# Patient Record
Sex: Male | Born: 1950
Health system: Southern US, Community
[De-identification: ages and names within clinical notes are randomized; demographics above are authoritative.]

## PROBLEM LIST (undated history)

## (undated) DIAGNOSIS — T8859XA Other complications of anesthesia, initial encounter: Secondary | ICD-10-CM

## (undated) DIAGNOSIS — E781 Pure hyperglyceridemia: Secondary | ICD-10-CM

## (undated) DIAGNOSIS — M1990 Unspecified osteoarthritis, unspecified site: Secondary | ICD-10-CM

## (undated) DIAGNOSIS — K449 Diaphragmatic hernia without obstruction or gangrene: Secondary | ICD-10-CM

## (undated) DIAGNOSIS — T4145XA Adverse effect of unspecified anesthetic, initial encounter: Secondary | ICD-10-CM

## (undated) DIAGNOSIS — R7301 Impaired fasting glucose: Secondary | ICD-10-CM

## (undated) DIAGNOSIS — I10 Essential (primary) hypertension: Secondary | ICD-10-CM

## (undated) DIAGNOSIS — E039 Hypothyroidism, unspecified: Secondary | ICD-10-CM

## (undated) DIAGNOSIS — E079 Disorder of thyroid, unspecified: Secondary | ICD-10-CM

## (undated) DIAGNOSIS — E7800 Pure hypercholesterolemia, unspecified: Secondary | ICD-10-CM

## (undated) HISTORY — PX: JOINT REPLACEMENT: SHX530

## (undated) HISTORY — DX: Disorder of thyroid, unspecified: E07.9

## (undated) HISTORY — PX: CHOLECYSTECTOMY: SHX55

## (undated) HISTORY — PX: EYE SURGERY: SHX253

## (undated) HISTORY — PX: OTHER SURGICAL HISTORY: SHX169

## (undated) HISTORY — DX: Impaired fasting glucose: R73.01

## (undated) HISTORY — DX: Pure hyperglyceridemia: E78.1

---

## 2004-10-09 ENCOUNTER — Ambulatory Visit: Payer: Self-pay | Admitting: Orthopedic Surgery

## 2004-10-10 ENCOUNTER — Ambulatory Visit (HOSPITAL_COMMUNITY): Admission: RE | Admit: 2004-10-10 | Discharge: 2004-10-10 | Payer: Self-pay | Admitting: Orthopedic Surgery

## 2004-10-16 ENCOUNTER — Ambulatory Visit: Payer: Self-pay | Admitting: Orthopedic Surgery

## 2004-11-04 ENCOUNTER — Ambulatory Visit (HOSPITAL_COMMUNITY): Admission: RE | Admit: 2004-11-04 | Discharge: 2004-11-04 | Payer: Self-pay | Admitting: Orthopedic Surgery

## 2004-11-04 ENCOUNTER — Ambulatory Visit: Payer: Self-pay | Admitting: Orthopedic Surgery

## 2004-11-06 ENCOUNTER — Ambulatory Visit: Payer: Self-pay | Admitting: Orthopedic Surgery

## 2004-11-07 ENCOUNTER — Encounter (HOSPITAL_COMMUNITY): Admission: RE | Admit: 2004-11-07 | Discharge: 2004-11-15 | Payer: Self-pay | Admitting: Orthopedic Surgery

## 2004-11-17 ENCOUNTER — Encounter (HOSPITAL_COMMUNITY): Admission: RE | Admit: 2004-11-17 | Discharge: 2004-12-17 | Payer: Self-pay | Admitting: Orthopedic Surgery

## 2004-11-27 ENCOUNTER — Ambulatory Visit: Payer: Self-pay | Admitting: Orthopedic Surgery

## 2004-12-19 ENCOUNTER — Encounter (HOSPITAL_COMMUNITY): Admission: RE | Admit: 2004-12-19 | Discharge: 2005-01-18 | Payer: Self-pay | Admitting: Orthopedic Surgery

## 2004-12-24 ENCOUNTER — Ambulatory Visit: Payer: Self-pay | Admitting: Orthopedic Surgery

## 2005-01-07 ENCOUNTER — Ambulatory Visit: Payer: Self-pay | Admitting: Orthopedic Surgery

## 2008-06-12 ENCOUNTER — Encounter (INDEPENDENT_AMBULATORY_CARE_PROVIDER_SITE_OTHER): Payer: Self-pay | Admitting: General Surgery

## 2008-06-12 ENCOUNTER — Ambulatory Visit (HOSPITAL_COMMUNITY): Admission: RE | Admit: 2008-06-12 | Discharge: 2008-06-12 | Payer: Self-pay | Admitting: General Surgery

## 2009-07-18 ENCOUNTER — Emergency Department (HOSPITAL_COMMUNITY): Admission: EM | Admit: 2009-07-18 | Discharge: 2009-07-18 | Payer: Self-pay | Admitting: Emergency Medicine

## 2010-05-05 LAB — COMPREHENSIVE METABOLIC PANEL
ALT: 30 U/L (ref 0–53)
AST: 49 U/L — ABNORMAL HIGH (ref 0–37)
Albumin: 3.7 g/dL (ref 3.5–5.2)
Alkaline Phosphatase: 54 U/L (ref 39–117)
BUN: 19 mg/dL (ref 6–23)
Creatinine, Ser: 1.48 mg/dL (ref 0.4–1.5)
GFR calc non Af Amer: 49 mL/min — ABNORMAL LOW (ref >60)
Glucose, Bld: 98 mg/dL (ref 70–99)
Potassium: 3.9 mEq/L (ref 3.5–5.1)
Total Bilirubin: 0.9 mg/dL (ref 0.3–1.2)
Total Protein: 7.9 g/dL (ref 6.0–8.3)

## 2010-05-05 LAB — DIFFERENTIAL
Basophils Absolute: 0 10*3/uL (ref 0.0–0.1)
Basophils Relative: 0 % (ref 0–1)
Eosinophils Relative: 6 % — ABNORMAL HIGH (ref 0–5)
Lymphs Abs: 2.3 10*3/uL (ref 0.7–4.0)
Neutro Abs: 5.3 10*3/uL (ref 1.7–7.7)
Neutrophils Relative %: 59 % (ref 43–77)

## 2010-05-05 LAB — URINALYSIS, ROUTINE W REFLEX MICROSCOPIC
Bilirubin Urine: NEGATIVE
Ketones, ur: NEGATIVE mg/dL
Protein, ur: NEGATIVE mg/dL
pH: 5.5 (ref 5.0–8.0)

## 2010-05-05 LAB — CBC
Hemoglobin: 13.7 g/dL (ref 13.0–17.0)
Platelets: 253 10*3/uL (ref 150–400)
RDW: 13.1 % (ref 11.5–15.5)

## 2010-05-05 LAB — LIPASE, BLOOD: Lipase: 30 U/L (ref 11–59)

## 2010-05-05 LAB — URINE MICROSCOPIC-ADD ON

## 2010-07-01 NOTE — H&P (Signed)
NAME:  Harold Nicholson, Harold Nicholson                ACCOUNT NO.:  [id number]   MEDICAL RECORD NO.:  [id number]          PATIENT TYPE:  AMB   LOCATION:  DAY                           FACILITY:  [facility]   PHYSICIAN:  Dalia Heading, M.D.  DATE OF BIRTH:  03-Jan-1951   DATE OF ADMISSION:  DATE OF DISCHARGE:  LH                              HISTORY & PHYSICAL   CHIEF COMPLAINT:  Hematochezia.   HISTORY OF PRESENT ILLNESS:  The patient is a 60 year old white male who  is referred for endoscopic evaluation.  He needs colonoscopy for  hematochezia.  He was noted recently while he was in rehab for alcohol  abuse.  No abdominal pain, weight loss, nausea, vomiting, diarrhea,  constipation, or melena had been noted.  He has never had a colonoscopy.  There is no family history of colon carcinoma.   PAST MEDICAL HISTORY:  Includes hypothyroidism, hypertension, and  extrinsic allergies.   PAST SURGICAL HISTORY:  Multiple knee surgeries.   CURRENT MEDICATIONS:  1. Hydrochlorothiazide 25 mg p.o. daily.  2. TriCor 145 mg p.o. daily.  3. Synthroid 150 mcg p.o. daily.  4. Bisoprolol/hydrochlorothiazide 10/6.25 mg p.o. daily.  5. Claritin 10 mg p.o. daily.  6. Potassium supplements 1 tablet p.o. daily   ALLERGIES:  No known drug allergies.   REVIEW OF SYSTEMS:  Noncontributory.   PHYSICAL EXAMINATION:  GENERAL:  The patient is a well developed and  well nourished white male, in no acute distress.  LUNGS:  Clear to auscultation with equal breath sounds bilaterally.  HEART:  Reveals regular rate and rhythm without S3, S4, or murmurs.  ABDOMEN:  Soft, nontender, and nondistended.  No hepatosplenomegaly or  masses are noted.  RECTAL:  Examination was deferred to the procedure.   IMPRESSION:  Hematochezia.   PLAN:  The patient is scheduled for a colonoscopy on June 12, 2008.  The risks and benefits of the procedure including bleeding and  perforation were fully explained to the patient, gave informed  consent.      Dalia Heading, M.D.  Electronically Signed     MAJ/MEDQ  D:  06/05/2008  T:  06/06/2008  Job:  [id number]   cc:   Corrie Mckusick, M.D.  Fax: [phone]   Short Stay At [facility]

## 2010-07-04 NOTE — Op Note (Signed)
NAMEMICHAIAH, HOLSOPPLE                ACCOUNT NO.:  [id number]   MEDICAL RECORD NO.:  [id number]          PATIENT TYPE:  AMB   LOCATION:  DAY                           FACILITY:  [facility]   PHYSICIAN:  Vickki Hearing, M.D.DATE OF BIRTH:  1950-03-24   DATE OF PROCEDURE:  11/04/2004  DATE OF DISCHARGE:                                 OPERATIVE REPORT   HISTORY:  A 60 year old male with left knee pain, positive meniscal signs,  positive MRI for medial meniscal tear and free edge tear lateral meniscus  with osteoarthritis of the medial compartment.   PREOPERATIVE DIAGNOSIS:  Torn medial meniscus, osteoarthritis, left knee.   POSTOPERATIVE DIAGNOSIS:  Torn medial meniscus, left knee; torn lateral  meniscus, left knee; osteoarthritis, left knee.   OPERATIVE FINDINGS:  The patient had grade 2 arthritis of the medial femoral  condyle. He had some grade 1 changes in the trochlear region. He had a torn  posterior horn medial meniscus, free edge tear lateral meniscus,  hypertrophic synovitis patellofemoral and medial compartment.   SURGEON:  Dr. Romeo Apple.   ANESTHETIC:  General by LMA.   COMPLICATIONS:  There were no complications. The patient went to recovery  room in stable condition after surgery.   Counts were correct.   The patient was identified as Harold Nicholson. Left knee was marked for surgery  by the patient, countersigned by the surgeon. History and physical were  updated. Antibiotics were given. He was taken to the operating for general  anesthesia. After successful anesthesia, his left lower extremity was  prepped and draped using sterile technique. At that time, a time-out was  taken and completed to confirm the procedure   Standard arthroscopy was done using a lateral viewing portal and medial  working portal. The diagnostic arthroscopy was performed. The findings are  noted as above.   We started on the medial side first. We used a duckbill forceps to resect  the tear,  balanced the meniscus with a straight motorized shaver, performed  a chondroplasty with the same. We debrided the medial patellofemoral  compartment and medial compartments from hypertrophic synovitis.   We then placed the shaver in the lateral compartment and debrided and  resected the free edge tear of the lateral meniscus.   We irrigated the wound and then closed with Steri-Strips, injected 30 cc of  plain half percent Marcaine that was well tolerated.   We then took the patient to the recovery room after his dressings and  CryoCuff were applied. He was in good condition.   POSTOPERATIVE PLAN:  Follow-up in two days. Start therapy in 3 days. He can  take Lorcet Plus 1 q.4h. p.r.n. for pain #60 with two refills. He should  weight bear as tolerated. Change his dressings on Wednesday to two Band-  Aids.      Vickki Hearing, M.D.  Electronically Signed     SEH/MEDQ  D:  11/04/2004  T:  11/04/2004  Job:  [id number]

## 2010-07-04 NOTE — H&P (Signed)
Harold Nicholson, CORONA NO.:  [id number]   MEDICAL RECORD NO.:  [id number]          PATIENT TYPE:  AMB   LOCATION:  DAY                           FACILITY:  [facility]   PHYSICIAN:  Vickki Hearing, M.D.DATE OF BIRTH:  1950-08-07   DATE OF ADMISSION:  DATE OF DISCHARGE:  LH                                HISTORY & PHYSICAL   CHIEF COMPLAINT:  Left knee pain.   HISTORY:  [age]-year-old male with two to three months of pain in his  left knee, denies any injury, he has noted some swelling, he does have  mechanical symptoms on an occasional basis, he describes the pain as  moderate in severity, intermittent, dull, present for two to three months  modified by walking and associated with some swelling.   REVIEW OF SYSTEMS:  Positive findings are thyroid disease, seasonal  allergies, as noted on musculoskeletal.  Other seven systems reviewed and  normal.   ALLERGIES:  None known.   MEDICAL PROBLEMS:  Hypertension, thyroid disease, hypercholesterolemia.  Surgery on his right knee in the past.   MEDICATIONS:  His pharmacy is [facility] .  He takes Levoxyl 175 mcg,  hydrochlorothiazide 25 mg, Tricor 145 mg, Lotrel 5/20 mg.   FAMILY HISTORY:  Arthritis.   FAMILY PHYSICIAN:  Dr. Assunta Found.   SOCIAL HISTORY:  He is married.  He works in [employer].  Does not smoke  but uses snuff.  Does report use of beer and caffeine and soft drinks.  Educational level not reported.   PHYSICAL EXAMINATION:  VITAL SIGNS:  Weight 265, pulse 78, respiratory rate  16.  GENERAL APPEARANCE:  Normal development, grooming, hygiene, nutrition.  Body  habitus large.  PSYCHIATRIC:  He is alert and oriented x3.  NEUROLOGIC EXAM:  Normal.  CARDIOVASCULAR:  Pulses are normal without any swelling, venous stasis  disease, temperature changes or edema.  SKIN:  Normal.  NECK:  Nodes are negative.  LEFT KNEE:  Examination of the left knee reveals normal range of motion and  strength,  and there is no ligamentous instability.  He has tenderness over  his medial joint line, mild swelling, his knee is in varus, he has positive  meniscal signs.  His opposite extremity is normal with history of previous  surgery with good results, upper extremity has normal range of motion,  strength, stability and alignment.   RADIOGRAPHS:  Mild osteoarthritis.   He did have MRI of the left knee on October 10, 2004.  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   DIAGNOSES:  1.  Medial meniscal tear.  2.  Osteoarthritis.   PLAN:  Arthroscopy left knee medial meniscectomy.      Vickki Hearing, M.D.  Electronically Signed     SEH/MEDQ  D:  11/03/2004  T:  11/03/2004  Job:  [id number]

## 2010-09-18 NOTE — H&P (Signed)
Harold Nicholson is an 60 y.o. male.   Chief Complaint: Epigastric pain, h/o colon polyps HPI: Has intermittent epigastric pain, though it has been less frequent recently.  Is on a PPI.  Has been treated in the past for H. Pylori infection.  Had TCS in past with several benign polyps removed.  Denies melena, hematochezia, dysphagia.  No past medical history on file.  No past surgical history on file.  No family history on file. Social History:  does not have a smoking history on file. He does not have any smokeless tobacco history on file. His alcohol and drug histories not on file.  Allergies: Allergies not on file  No current facility-administered medications on file as of .   No current outpatient prescriptions on file as of .    No results found for this or any previous visit (from the past 48 hour(s)). No results found.  Review of Systems  Constitutional: Negative.   HENT: Negative.   Eyes: Negative.   Respiratory: Negative.   Cardiovascular: Negative.   Gastrointestinal: Negative.   Genitourinary: Negative.   Musculoskeletal: Negative.   Skin: Negative.   Neurological: Negative.   Endo/Heme/Allergies: Negative.   Psychiatric/Behavioral: Negative.     There were no vitals taken for this visit. Physical Exam  Constitutional: He is oriented to person, place, and time. He appears well-developed and well-nourished.  HENT:  Head: Normocephalic and atraumatic.  Neck: Normal range of motion. Neck supple.  Cardiovascular: Normal rate, regular rhythm and normal heart sounds.   Respiratory: Effort normal and breath sounds normal.  GI: Soft. Bowel sounds are normal. He exhibits no distension. There is no tenderness. There is no rebound.  Neurological: He is alert and oriented to person, place, and time.  Skin: Skin is warm and dry.  Psychiatric: He has a normal mood and affect. His behavior is normal. Judgment and thought content normal.     Assessment/Plan Epigastric pain,  h/o colon polyps Plan:  Scheduled for EGD, TCS on 10/07/10.  Rainbow Salman A 09/18/2010, 2:49 PM

## 2010-10-07 ENCOUNTER — Encounter (HOSPITAL_COMMUNITY)
Admission: RE | Disposition: A | Discharge status: Home / Self Care | Payer: Self-pay | Source: Ambulatory Visit | Attending: General Surgery

## 2010-10-07 ENCOUNTER — Ambulatory Visit (HOSPITAL_COMMUNITY)
Admission: RE | Admit: 2010-10-07 | Discharge: 2010-10-07 | Disposition: A | Discharge status: Home / Self Care | Payer: 59 | Source: Ambulatory Visit | Attending: General Surgery | Admitting: General Surgery

## 2010-10-07 ENCOUNTER — Encounter (HOSPITAL_COMMUNITY): Payer: Self-pay | Admitting: *Deleted

## 2010-10-07 DIAGNOSIS — R109 Unspecified abdominal pain: Secondary | ICD-10-CM | POA: Insufficient documentation

## 2010-10-07 DIAGNOSIS — Z860100 Personal history of colon polyps, unspecified: Secondary | ICD-10-CM | POA: Insufficient documentation

## 2010-10-07 DIAGNOSIS — K2970 Gastritis, unspecified, without bleeding: Secondary | ICD-10-CM | POA: Insufficient documentation

## 2010-10-07 HISTORY — DX: Pure hypercholesterolemia, unspecified: E78.00

## 2010-10-07 HISTORY — PX: ESOPHAGOGASTRODUODENOSCOPY: SHX5428

## 2010-10-07 HISTORY — DX: Essential (primary) hypertension: I10

## 2010-10-07 HISTORY — PX: COLONOSCOPY: SHX5424

## 2010-10-07 HISTORY — DX: Diaphragmatic hernia without obstruction or gangrene: K44.9

## 2010-10-07 SURGERY — COLONOSCOPY
Anesthesia: Moderate Sedation

## 2010-10-07 MED ORDER — MIDAZOLAM HCL 5 MG/5ML IJ SOLN
INTRAMUSCULAR | Status: DC | PRN
  Administered 2010-10-07: 4 mg via INTRAVENOUS

## 2010-10-07 MED ORDER — MIDAZOLAM HCL 5 MG/5ML IJ SOLN
INTRAMUSCULAR | Status: AC
  Filled 2010-10-07: qty 10

## 2010-10-07 MED ORDER — MEPERIDINE HCL 25 MG/ML IJ SOLN
INTRAMUSCULAR | Status: DC | PRN
  Administered 2010-10-07: 50 mg via INTRAVENOUS

## 2010-10-07 MED ORDER — MEPERIDINE HCL 100 MG/ML IJ SOLN
INTRAMUSCULAR | Status: AC
  Filled 2010-10-07: qty 2

## 2010-10-07 MED ORDER — SODIUM CHLORIDE 0.45 % IV SOLN
Freq: Once | INTRAVENOUS | Status: AC
  Administered 2010-10-07: 09:00:00 via INTRAVENOUS

## 2010-10-07 MED ORDER — STERILE WATER FOR IRRIGATION IR SOLN
Status: DC | PRN
  Administered 2010-10-07: 09:00:00

## 2010-10-08 LAB — CLOTEST (H. PYLORI), BIOPSY: Helicobacter screen: NEGATIVE

## 2010-10-10 ENCOUNTER — Encounter (HOSPITAL_COMMUNITY): Payer: Self-pay | Admitting: General Surgery

## 2010-11-17 ENCOUNTER — Other Ambulatory Visit (HOSPITAL_COMMUNITY): Payer: Self-pay | Admitting: Nephrology

## 2010-11-17 DIAGNOSIS — N289 Disorder of kidney and ureter, unspecified: Secondary | ICD-10-CM

## 2010-12-01 ENCOUNTER — Ambulatory Visit (HOSPITAL_COMMUNITY)
Admission: RE | Admit: 2010-12-01 | Discharge: 2010-12-01 | Disposition: A | Discharge status: Home / Self Care | Payer: 59 | Source: Ambulatory Visit | Attending: Nephrology | Admitting: Nephrology

## 2010-12-01 DIAGNOSIS — N289 Disorder of kidney and ureter, unspecified: Secondary | ICD-10-CM | POA: Insufficient documentation

## 2011-09-10 ENCOUNTER — Other Ambulatory Visit: Payer: Self-pay

## 2011-09-10 DIAGNOSIS — R0989 Other specified symptoms and signs involving the circulatory and respiratory systems: Secondary | ICD-10-CM

## 2011-10-12 ENCOUNTER — Encounter: Payer: Self-pay | Admitting: Vascular Surgery

## 2011-10-13 ENCOUNTER — Encounter: Payer: Self-pay | Admitting: Vascular Surgery

## 2011-10-14 ENCOUNTER — Encounter (INDEPENDENT_AMBULATORY_CARE_PROVIDER_SITE_OTHER): Payer: 59 | Admitting: Vascular Surgery

## 2011-10-14 ENCOUNTER — Ambulatory Visit (INDEPENDENT_AMBULATORY_CARE_PROVIDER_SITE_OTHER): Payer: 59 | Admitting: Vascular Surgery

## 2011-10-14 ENCOUNTER — Encounter: Payer: Self-pay | Admitting: Vascular Surgery

## 2011-10-14 VITALS — BP 144/64 | HR 50 | Resp 16 | Ht 73.0 in | Wt 273.0 lb

## 2011-10-14 DIAGNOSIS — M79606 Pain in leg, unspecified: Secondary | ICD-10-CM

## 2011-10-14 DIAGNOSIS — R0989 Other specified symptoms and signs involving the circulatory and respiratory systems: Secondary | ICD-10-CM

## 2011-10-14 DIAGNOSIS — I70219 Atherosclerosis of native arteries of extremities with intermittent claudication, unspecified extremity: Secondary | ICD-10-CM

## 2011-10-14 DIAGNOSIS — M79609 Pain in unspecified limb: Secondary | ICD-10-CM

## 2011-10-14 NOTE — Progress Notes (Signed)
[facility]  Patient name: Harold Nicholson MRN: [id number] DOB: 03/16/1950 Sex: male  REASON FOR CONSULT: bilateral leg pain  HPI: Harold Nicholson is a 61 y.o. male states that for years he has had pain in both lower extremities. He states that his legs "give out on him" when he is ambulating. He experiences some pain involves his entire legs but also occurs oftentimes simply with standing. He experiences the pain when walking and a variable distance and the pain is not always resolve when he stops walking. I do not get any clear-cut history of calf claudication or thigh claudication. He denies any history of rest pain. He does have a history of lumbar disc disease.  Past Medical History  Diagnosis Date  . Hypertension   . Hypercholesteremia   . Hiatal hernia   . Pure hyperglyceridemia   . Thyroid disease     Hypothyroid   . Impaired fasting glucose     Family History  Problem Relation Age of Onset  . Diabetes Mother   . Hyperlipidemia Mother   . Hypertension Mother   . Hyperlipidemia Father   . Hypertension Father   . Diabetes Sister   . Hearing loss Sister   . Hyperlipidemia Sister   . Hypertension Sister   . Diabetes Brother   . Hyperlipidemia Brother   . Hypertension Brother     SOCIAL HISTORY: History  Substance Use Topics  . Smoking status: Never Smoker   . Smokeless tobacco: Current User    Types: Snuff  . Alcohol Use: No     quit drinking 3 years ago    No Known Allergies  Current Outpatient Prescriptions  Medication Sig Dispense Refill  . bisoprolol-hydrochlorothiazide (ZIAC) 10-6.25 MG per tablet Take 1 tablet by mouth daily.        . cholecalciferol (VITAMIN D) 1000 UNITS tablet Take 1,000 Units by mouth daily.      . fenofibrate 160 MG tablet Take 160 mg by mouth daily.        . hydrochlorothiazide 25 MG tablet Take 25 mg by mouth daily.        Marland Kitchen levothyroxine (SYNTHROID, LEVOTHROID) 150 MCG tablet Take 150 mcg by mouth  daily.        Marland Kitchen loratadine (CLARITIN) 10 MG tablet Take 10 mg by mouth daily.        Marland Kitchen omeprazole (PRILOSEC) 40 MG capsule Take 40 mg by mouth daily.        Marland Kitchen POTASSIUM AMINOBENZOATE PO Take by mouth daily.        REVIEW OF SYSTEMS: Arly.Keller ] denotes positive finding; [  ] denotes negative finding  CARDIOVASCULAR:  [ ]  chest pain   [ ]  chest pressure   [ ]  palpitations   [ ]  orthopnea   [ ]  dyspnea on exertion   [ ]  claudication   [ ]  rest pain   [ ]  DVT   [ ]  phlebitis PULMONARY:   [ ]  productive cough   [ ]  asthma   [ ]  wheezing NEUROLOGIC:   [ ]  weakness  [ ]  paresthesias  [ ]  aphasia  [ ]  amaurosis  [ ]  dizziness HEMATOLOGIC:   [ ]  bleeding problems   [ ]  clotting disorders MUSCULOSKELETAL:  [ ]  joint pain   [ ]  joint swelling [ ]  leg swelling GASTROINTESTINAL: [ ]   blood in stool  [ ]   hematemesis GENITOURINARY:  [ ]   dysuria  [ ]   hematuria  PSYCHIATRIC:  [ ]  history of major depression INTEGUMENTARY:  [ ]  rashes  [ ]  ulcers CONSTITUTIONAL:  [ ]  fever   [ ]  chills  PHYSICAL EXAM: Filed Vitals:   10/14/11 0919  BP: 144/64  Pulse: 50  Resp: 16  Height: 6\' 1"  (1.854 m)  Weight: 273 lb (123.832 kg)  SpO2: 100%   Body mass index is 36.02 kg/(m^2). GENERAL: The patient is a well-nourished male, in no acute distress. The vital signs are documented above. CARDIOVASCULAR: There is a regular rate and rhythm without significant murmur appreciated. I do not detect carotid bruits. He has palpable femoral, popliteal, and posterior tibial pulses bilaterally. It is difficult to palpate his dorsalis pedis pulses although he has triphasic Doppler signals in the dorsalis pedis position. PULMONARY: There is good air exchange bilaterally without wheezing or rales. ABDOMEN: Soft and non-tender with normal pitched bowel sounds.  MUSCULOSKELETAL: There are no major deformities or cyanosis. NEUROLOGIC: No focal weakness or paresthesias are detected. SKIN: There are no ulcers or rashes noted. PSYCHIATRIC:  The patient has a normal affect.  DATA:  I have independently interpreted his arterial Doppler study. This shows triphasic waveforms in the dorsalis pedis and posterior tibial positions bilaterally. He has normal ABIs bilaterally. He has normal toe pressures bilaterally.  I have reviewed his records from Dr. Lamar Blinks office. His blood pressure and cholesterol are well controlled. His last cholesterol level is 157 on 02/16/2011. LDL cholesterol was 99 on 16/11/9602.  MEDICAL ISSUES:  Leg pain I do not think that this patient's leg pain is related to peripheral vascular disease. I think it is more likely related to his lumbar disc disease. He experiences pain simply with standing. He does experience pain with ambulation but the symptoms do not quickly respond when he stops walking. In addition he has palpable posterior tibial pulses and triphasic Doppler signals in the dorsalis pedis and posterior tibial positions bilaterally with normal ABIs and normal toe pressures. There is a very small chance that he has some mild iliac disease which could explain some of his symptoms when he walks although I think this is unlikely. If his symptoms progressed and he is undergone workup for his lumbar disc disease we could consider arteriography in the future, however, at this point I think the yield would be very low.   Justis Closser S [facility] [place] [facility]per: [phone]

## 2011-10-14 NOTE — Assessment & Plan Note (Signed)
I do not think that this patient's leg pain is related to peripheral vascular disease. I think it is more likely related to his lumbar disc disease. He experiences pain simply with standing. He does experience pain with ambulation but the symptoms do not quickly respond when he stops walking. In addition he has palpable posterior tibial pulses and triphasic Doppler signals in the dorsalis pedis and posterior tibial positions bilaterally with normal ABIs and normal toe pressures. There is a very small chance that he has some mild iliac disease which could explain some of his symptoms when he walks although I think this is unlikely. If his symptoms progressed and he is undergone workup for his lumbar disc disease we could consider arteriography in the future, however, at this point I think the yield would be very low.

## 2013-04-18 ENCOUNTER — Other Ambulatory Visit (HOSPITAL_COMMUNITY): Payer: Self-pay | Admitting: Family Medicine

## 2013-04-18 ENCOUNTER — Ambulatory Visit (HOSPITAL_COMMUNITY)
Admission: RE | Admit: 2013-04-18 | Discharge: 2013-04-18 | Disposition: A | Discharge status: Home / Self Care | Payer: Medicare HMO | Source: Ambulatory Visit | Attending: Family Medicine | Admitting: Family Medicine

## 2013-04-18 DIAGNOSIS — K7689 Other specified diseases of liver: Secondary | ICD-10-CM | POA: Insufficient documentation

## 2013-04-18 DIAGNOSIS — R109 Unspecified abdominal pain: Secondary | ICD-10-CM

## 2013-04-18 DIAGNOSIS — M47817 Spondylosis without myelopathy or radiculopathy, lumbosacral region: Secondary | ICD-10-CM | POA: Insufficient documentation

## 2013-04-18 DIAGNOSIS — R1031 Right lower quadrant pain: Secondary | ICD-10-CM

## 2013-04-18 MED ORDER — IOHEXOL 300 MG/ML  SOLN
100.0000 mL | Freq: Once | INTRAMUSCULAR | Status: AC | PRN
  Administered 2013-04-18: 100 mL via INTRAVENOUS

## 2013-04-19 ENCOUNTER — Ambulatory Visit (HOSPITAL_COMMUNITY)
Admission: RE | Admit: 2013-04-19 | Discharge: 2013-04-19 | Disposition: A | Discharge status: Home / Self Care | Payer: Medicare HMO | Source: Ambulatory Visit | Attending: Family Medicine | Admitting: Family Medicine

## 2013-04-19 DIAGNOSIS — K7689 Other specified diseases of liver: Secondary | ICD-10-CM | POA: Insufficient documentation

## 2013-04-19 DIAGNOSIS — R1011 Right upper quadrant pain: Secondary | ICD-10-CM | POA: Insufficient documentation

## 2013-04-19 DIAGNOSIS — R109 Unspecified abdominal pain: Secondary | ICD-10-CM

## 2013-04-20 ENCOUNTER — Emergency Department (HOSPITAL_COMMUNITY)
Admission: EM | Admit: 2013-04-20 | Discharge: 2013-04-20 | Disposition: A | Discharge status: Home / Self Care | Payer: Medicare HMO | Attending: Emergency Medicine | Admitting: Emergency Medicine

## 2013-04-20 ENCOUNTER — Encounter (HOSPITAL_COMMUNITY): Payer: Self-pay | Admitting: Emergency Medicine

## 2013-04-20 DIAGNOSIS — R109 Unspecified abdominal pain: Secondary | ICD-10-CM

## 2013-04-20 DIAGNOSIS — I10 Essential (primary) hypertension: Secondary | ICD-10-CM | POA: Insufficient documentation

## 2013-04-20 DIAGNOSIS — E7800 Pure hypercholesterolemia, unspecified: Secondary | ICD-10-CM | POA: Insufficient documentation

## 2013-04-20 DIAGNOSIS — Z8719 Personal history of other diseases of the digestive system: Secondary | ICD-10-CM | POA: Insufficient documentation

## 2013-04-20 DIAGNOSIS — R1031 Right lower quadrant pain: Secondary | ICD-10-CM | POA: Insufficient documentation

## 2013-04-20 DIAGNOSIS — E039 Hypothyroidism, unspecified: Secondary | ICD-10-CM | POA: Insufficient documentation

## 2013-04-20 DIAGNOSIS — R630 Anorexia: Secondary | ICD-10-CM | POA: Insufficient documentation

## 2013-04-20 DIAGNOSIS — Z79899 Other long term (current) drug therapy: Secondary | ICD-10-CM | POA: Insufficient documentation

## 2013-04-20 LAB — COMPREHENSIVE METABOLIC PANEL
ALBUMIN: 3.5 g/dL (ref 3.5–5.2)
ALK PHOS: 44 U/L (ref 39–117)
ALT: 18 U/L (ref 0–53)
AST: 27 U/L (ref 0–37)
BILIRUBIN TOTAL: 0.7 mg/dL (ref 0.3–1.2)
BUN: 16 mg/dL (ref 6–23)
CHLORIDE: 98 meq/L (ref 96–112)
CO2: 26 mEq/L (ref 19–32)
Calcium: 9.8 mg/dL (ref 8.4–10.5)
Creatinine, Ser: 1.56 mg/dL — ABNORMAL HIGH (ref 0.50–1.35)
GFR calc Af Amer: 54 mL/min — ABNORMAL LOW (ref >90)
GFR calc non Af Amer: 47 mL/min — ABNORMAL LOW (ref >90)
Glucose, Bld: 107 mg/dL — ABNORMAL HIGH (ref 70–99)
POTASSIUM: 4 meq/L (ref 3.7–5.3)
SODIUM: 136 meq/L — AB (ref 137–147)
TOTAL PROTEIN: 8.4 g/dL — AB (ref 6.0–8.3)

## 2013-04-20 LAB — CBC WITH DIFFERENTIAL/PLATELET
BASOS ABS: 0 10*3/uL (ref 0.0–0.1)
BASOS PCT: 0 % (ref 0–1)
EOS ABS: 0.3 10*3/uL (ref 0.0–0.7)
EOS PCT: 3 % (ref 0–5)
HEMATOCRIT: 39.5 % (ref 39.0–52.0)
Hemoglobin: 13.2 g/dL (ref 13.0–17.0)
Lymphocytes Relative: 24 % (ref 12–46)
Lymphs Abs: 2.5 10*3/uL (ref 0.7–4.0)
MCH: 31.1 pg (ref 26.0–34.0)
MCHC: 33.4 g/dL (ref 30.0–36.0)
MCV: 93.2 fL (ref 78.0–100.0)
MONO ABS: 1 10*3/uL (ref 0.1–1.0)
Monocytes Relative: 10 % (ref 3–12)
Neutro Abs: 6.4 10*3/uL (ref 1.7–7.7)
Neutrophils Relative %: 63 % (ref 43–77)
PLATELETS: 305 10*3/uL (ref 150–400)
RBC: 4.24 MIL/uL (ref 4.22–5.81)
RDW: 12.4 % (ref 11.5–15.5)
WBC: 10.2 10*3/uL (ref 4.0–10.5)

## 2013-04-20 LAB — LIPASE, BLOOD: Lipase: 25 U/L (ref 11–59)

## 2013-04-20 MED ORDER — ONDANSETRON HCL 4 MG/2ML IJ SOLN
4.0000 mg | Freq: Once | INTRAMUSCULAR | Status: AC
  Administered 2013-04-20: 4 mg via INTRAVENOUS
  Filled 2013-04-20: qty 2

## 2013-04-20 MED ORDER — MORPHINE SULFATE 4 MG/ML IJ SOLN
4.0000 mg | Freq: Once | INTRAMUSCULAR | Status: AC
  Administered 2013-04-20: 4 mg via INTRAVENOUS
  Filled 2013-04-20: qty 1

## 2013-04-20 MED ORDER — OXYCODONE-ACETAMINOPHEN 5-325 MG PO TABS: 2.0000 | ORAL_TABLET | ORAL | Status: DC | PRN

## 2013-04-20 MED ORDER — SODIUM CHLORIDE 0.9 % IV BOLUS (SEPSIS)
1000.0000 mL | Freq: Once | INTRAVENOUS | Status: AC
  Administered 2013-04-20: 1000 mL via INTRAVENOUS

## 2013-04-20 MED ORDER — ONDANSETRON 8 MG PO TBDP: ORAL_TABLET | ORAL | Status: DC

## 2013-04-20 NOTE — ED Provider Notes (Signed)
CSN: [id number]     Arrival date & time 04/20/13  3818 History  This chart was scribed for Nat Christen, MD,  by Stacy Gardner, ED Scribe. The patient was seen in room [facility]14[facility]14 and the patient's care was started at 9:17 AM.    First MD Initiated Contact with Patient 04/20/13 [phone]     Chief Complaint  Patient presents with  . Abdominal Pain     (Consider location/radiation/quality/duration/timing/severity/associated sxs/prior Treatment) The history is provided by the patient, medical records and the spouse. No language interpreter was used.   HPI Comments: Harold Nicholson is a 63 y.o. male who presents to the Emergency Department complaining of constant moderate right lower abdominal pain with onset eight days ago and is worse today.  Pt has the associated symptom of decreased appetite for the past two days. Nothing seems to make the pain better. He had a ultrasound and CT Scan at [facility] yesterday that confirmed gallbladder wall thickening. Pt denies any other other pain. Denies pain radiating to his back. Severity is moderate.  Past Medical History  Diagnosis Date  . Hypertension   . Hypercholesteremia   . Hiatal hernia   . Pure hyperglyceridemia   . Thyroid disease     Hypothyroid   . Impaired fasting glucose    Past Surgical History  Procedure Laterality Date  . Right knee      X 2  . Left knee arthroscopy    . Colonoscopy  10/07/2010    Procedure: COLONOSCOPY;  Surgeon: Jamesetta So;  Location: [facility];  Service: Gastroenterology;  Laterality: N/A;  . Esophagogastroduodenoscopy  10/07/2010    Procedure: ESOPHAGOGASTRODUODENOSCOPY (EGD);  Surgeon: Jamesetta So;  Location: [facility];  Service: Gastroenterology;  Laterality: N/A;  . Joint replacement      Right knee   Family History  Problem Relation Age of Onset  . Diabetes Mother   . Hyperlipidemia Mother   . Hypertension Mother   . Hyperlipidemia Father   . Hypertension Father   . Diabetes Sister   .  Hearing loss Sister   . Hyperlipidemia Sister   . Hypertension Sister   . Diabetes Brother   . Hyperlipidemia Brother   . Hypertension Brother    History  Substance Use Topics  . Smoking status: Never Smoker   . Smokeless tobacco: Current User    Types: Snuff  . Alcohol Use: No     Comment: quit drinking 3 years ago    Review of Systems  Constitutional: Positive for appetite change (decreased).  Gastrointestinal: Positive for abdominal pain.  All other systems reviewed and are negative.      Allergies  Review of patient's allergies indicates no known allergies.  Home Medications   Current Outpatient Rx  Name  Route  Sig  Dispense  Refill  . bisoprolol-hydrochlorothiazide (ZIAC) 10-6.25 MG per tablet   Oral   Take 1 tablet by mouth daily.           . cholecalciferol (VITAMIN D) 1000 UNITS tablet   Oral   Take 1,000 Units by mouth daily.         . fenofibrate 160 MG tablet   Oral   Take 160 mg by mouth daily.           . hydrochlorothiazide 25 MG tablet   Oral   Take 12.5 mg by mouth daily.          Marland Kitchen levothyroxine (SYNTHROID, LEVOTHROID) 175 MCG tablet  Oral   Take 175 mcg by mouth daily before breakfast.         . loratadine (CLARITIN) 10 MG tablet   Oral   Take 10 mg by mouth daily.           Marland Kitchen omeprazole (PRILOSEC) 40 MG capsule   Oral   Take 40 mg by mouth daily.           Marland Kitchen POTASSIUM AMINOBENZOATE PO   Oral   Take 2 tablets by mouth daily.          . ondansetron (ZOFRAN ODT) 8 MG disintegrating tablet      8mg  ODT q4 hours prn nausea   15 tablet   0   . oxyCODONE-acetaminophen (PERCOCET) 5-325 MG per tablet   Oral   Take 2 tablets by mouth every 4 (four) hours as needed.   30 tablet   0    BP 147/70  Pulse 71  Temp(Src) 98 F (36.7 C) (Oral)  Resp 20  Ht 5\' 11"  (1.803 m)  Wt 286 lb (129.729 kg)  BMI 39.91 kg/m2  SpO2 96% Physical Exam  Nursing note and vitals reviewed. Constitutional: He is oriented to  person, place, and time. He appears well-developed and well-nourished.  HENT:  Head: Normocephalic.  Eyes: Pupils are equal, round, and reactive to light.  Neck: Normal range of motion. Neck supple.  Cardiovascular: Normal rate and regular rhythm.   Pulmonary/Chest: Effort normal and breath sounds normal.  Abdominal: Soft. There is tenderness (RLQ).  Neurological: He is alert and oriented to person, place, and time. No cranial nerve deficit. He exhibits normal muscle tone. Coordination normal.  Skin: Skin is warm and dry.  Psychiatric: He has a normal mood and affect. His behavior is normal.    ED Course  Procedures (including critical care time) DIAGNOSTIC STUDIES: Oxygen Saturation is 96% on room air, normal by my interpretation.    COORDINATION OF CARE:  9:09 AM Discussed course of care with pt . Pt understands and agrees.   Labs Review Labs Reviewed  COMPREHENSIVE METABOLIC PANEL - Abnormal; Notable for the following:    Sodium 136 (*)    Glucose, Bld 107 (*)    Creatinine, Ser 1.56 (*)    Total Protein 8.4 (*)    GFR calc non Af Amer 47 (*)    GFR calc Af Amer 54 (*)    All other components within normal limits  CBC WITH DIFFERENTIAL  LIPASE, BLOOD   Imaging Review US Abdomen Complete  04/19/2013   CLINICAL DATA:  Right upper quadrant abdominal pain.  EXAM: ULTRASOUND ABDOMEN COMPLETE  COMPARISON:  CT of the abdomen and pelvis 04/18/2013.  FINDINGS: Gallbladder:  Mild gallbladder wall thickening measuring up to 5.3 mm. No gallstones. Gallbladder was not distended. No abnormal pericholecystic fluid. However, per report from the sonographer, the patient did exhibit a sonographic Murphy's sign on examination. Associated with some areas of the gallbladder wall there was some ring down artifact, suggesting underlying adenomyomatosis.  Common bile duct:  Diameter: Normal in caliber measuring 3.9 mm in the porta hepatis.  Liver:  Diffusely increased and slightly heterogeneous  echogenicity, suggesting hepatic steatosis. Although the right lobe of the liver appears elongated (18.7 cm craniocaudal length) based on comparison with recent CT of the abdomen 04/18/2013, this is favored to represent a Reidel's lobe variant, rather than evidence of underlying hepatomegaly. No intrahepatic biliary ductal dilatation.  IVC:  No abnormality visualized.  Pancreas:  Poorly visualized secondary  to overlying bowel gas. Visualized portion unremarkable.  Spleen:  Size and appearance within normal limits.  9.7 cm in length.  Right Kidney:  Length: 12 cm. Echogenicity within normal limits. No mass or hydronephrosis visualized.  Left Kidney:  Length: 12.3 cm. Echogenicity within normal limits. No mass or hydronephrosis visualized.  Abdominal aorta:  No aneurysm visualized. 2.5 cm in diameter proximally, tapering appropriately distally.  Other findings:  None.  IMPRESSION: 1. Gallbladder wall thickening with evidence of adenomyomatosis. No gallstones or gallbladder distention. However, per report from the sonographer, the patient did exhibit a sonographic Murphy's sign on examination. Overall, findings are equivocal, and could be seen in the setting of chronic cholecystitis, but clinical correlation to exclude clinical evidence of acute cholecystitis is recommended. Further evaluation with nuclear medicine study may provide additional diagnostic information if clinically indicated. 2. Hepatic steatosis.   Electronically Signed   By: Vinnie Langton M.D.   On: 04/19/2013 14:09   Ct Abdomen Pelvis W Contrast  04/18/2013   CLINICAL DATA:  Right lower quadrant abdominal pain.  EXAM: CT ABDOMEN AND PELVIS WITH CONTRAST  TECHNIQUE: Multidetector CT imaging of the abdomen and pelvis was performed using the standard protocol following bolus administration of intravenous contrast.  CONTRAST:  163mL OMNIPAQUE IOHEXOL 300 MG/ML  SOLN  COMPARISON:  None.  FINDINGS: The lung bases are clear. The heart is normal in size.  No pericardial effusion. The distal esophagus is grossly normal.  The liver is unremarkable except for small scattered cysts. The liver contour is slightly irregular but I do not see any obvious changes of cirrhosis otherwise. The spleen is normal in size. The portal and splenic veins are patent. The gallbladder is somewhat contracted. The wall is somewhat irregular and enhancing appears to be some inflammation in the right upper quadrant around the gallbladder fossa and adjacent liver. Could not exclude adenomyomatosis or chronic cholecystitis. Ultrasound may be helpful for further evaluation. There are some borderline enlarged upper abdominal lymph nodes scattered retroperitoneal lymph nodes are also noted but no mass or adenopathy. The aorta is normal in caliber. The major branch vessels are patent. The pancreas is normal. No common bowel duct dilatation. The adrenal glands and kidneys are unremarkable. No renal or obstructing ureteral calculi.  The stomach, duodenum, small bowel and colon are unremarkable. No inflammatory changes, mass lesions or obstructive findings. The appendix is normal.  The bladder, prostate gland and seminal vesicles are unremarkable. No pelvic mass, adenopathy or free pelvic fluid collections. No inguinal mass or adenopathy.  The bony structures are intact. There is severe multilevel disc disease and facet disease in the spine.  IMPRESSION: Inflammatory changes in the right upper quadrant near the gallbladder. The gallbladder is contracted and the wall is slightly thickened and demonstrates nodularity and enhancement. Findings could be due to adenomyomatosis or possible chronic cholecystitis. Recommend right upper quadrant ultrasound for further evaluation. No common bile duct dilatation.  Scattered small low-attenuation liver lesions most consistent with benign hepatic cysts.  Mild contour irregularity of the liver without obvious findings for cirrhosis.  Enlarged upper abdominal lymph  nodes may be inflammatory/reactive change.  Severe degenerative changes noted in the lumbar spine.   Electronically Signed   By: Kalman Jewels M.D.   On: 04/18/2013 17:45     EKG Interpretation None      MDM   Final diagnoses:  Abdominal pain  No acute abdomen. Discussed with Dr. Aviva Signs. He will followup next Tuesday with patient. This was discussed  with the patient and his wife. Discharge medications Percocet and Zofran 8 mg ODT.  I personally performed the services described in this documentation, which was scribed in my presence. The recorded information has been reviewed and is accurate.     Nat Christen, MD 04/20/13 [phone]

## 2013-04-20 NOTE — Discharge Instructions (Signed)
Medication for pain and nausea. Avoid greasy fatty foods. Follow up Dr. Aviva Signs on Tuesday at 9 AM. Call office to confirm appointment

## 2013-04-20 NOTE — ED Notes (Signed)
Pt states he was told he has gallstones. Complain of pain in RUQ. Denies other symptoms

## 2013-04-20 NOTE — ED Notes (Signed)
Discharge instructions given to pt. Pt verbalized understanding. Pt denies pain at present.

## 2013-04-26 ENCOUNTER — Encounter (HOSPITAL_COMMUNITY): Payer: Self-pay | Admitting: Pharmacy Technician

## 2013-04-26 ENCOUNTER — Other Ambulatory Visit: Payer: Self-pay

## 2013-04-26 ENCOUNTER — Encounter (HOSPITAL_COMMUNITY): Payer: Self-pay

## 2013-04-26 ENCOUNTER — Encounter (HOSPITAL_COMMUNITY)
Admission: RE | Admit: 2013-04-26 | Discharge: 2013-04-26 | Disposition: A | Discharge status: Home / Self Care | Payer: Medicare HMO | Source: Ambulatory Visit | Attending: General Surgery | Admitting: General Surgery

## 2013-04-26 DIAGNOSIS — Z01812 Encounter for preprocedural laboratory examination: Secondary | ICD-10-CM | POA: Insufficient documentation

## 2013-04-26 DIAGNOSIS — Z01810 Encounter for preprocedural cardiovascular examination: Secondary | ICD-10-CM | POA: Insufficient documentation

## 2013-04-26 HISTORY — DX: Unspecified osteoarthritis, unspecified site: M19.90

## 2013-04-26 HISTORY — DX: Hypothyroidism, unspecified: E03.9

## 2013-04-26 NOTE — H&P (Signed)
  NTS SOAP Note  Vital Signs:  Vitals as of: 5/91/6384: Systolic 665: Diastolic 82: Heart Rate 50: Temp 97.42F: Height 36ft 2in: Weight 274Lbs 0 Ounces: BMI 35.18  BMI : 35.18 kg/m2  Subjective: This 63 Years 78 Months old Male presents for of abdominal pain.  Has been present for the past few weeks.  Has been to ER for right upper quadrant abdominal pain, nausea, and some food intolerance.  No fever, chills, jaundice.  U/S of gallbladder shows thickened gallbladder wall, + Murphy's sign, adenomyosis/polyps seen.  Pain has somewhat ease, though still present on occassion.  Review of Symptoms:  Constitutional:unremarkable   Head:unremarkable    Eyes:unremarkable   Nose/Mouth/Throat:unremarkable Cardiovascular:  unremarkable   Respiratory:unremarkable   Gastrointestin    abdominal pain Genitourinary:unremarkable       joint and back pain Skin:unremarkable Hematolgic/Lymphatic:unremarkable     Allergic/Immunologic:unremarkable     Past Medical History:    Reviewed  Past Medical History  Surgical History: multiple knee surgeries Medical Problems:  High Blood pressure, Hypothyroidism Allergies: nkda Medications: omeprazole, HCTZ, fenofibrate, bisoprolol, lovoxyl   Social History:Reviewed  Social History  Preferred Language: English ([place]) Race:  White Ethnicity: Not Hispanic / Latino Age: 63 Years 4 Months Marital Status:  M Alcohol:  No Recreational drug(s):  No   Smoking Status: Never smoker reviewed on 04/25/2013 Functional Status reviewed on 04/25/2013 ------------------------------------------------ Bathing: Normal Cooking: Normal Dressing: Normal Driving: Normal Eating: Normal Managing Meds: Normal Oral Care: Normal Shopping: Normal Toileting: Normal Transferring: Normal Walking: Normal Cognitive Status reviewed on 04/25/2013 ------------------------------------------------ Attention: Normal Decision  Making: Normal Language: Normal Memory: Normal Motor: Normal Perception: Normal Problem Solving: Normal Visual and Spatial: Normal   Family History:  Reviewed  Family Health History Mother, Deceased; History Unknown Father, Deceased; Heart attack (myocardial infarction);     Objective Information: General:  Well appearing, well nourished in no distress.   no scleral icterus Heart:  RRR, no murmur Lungs:    CTA bilaterally, no wheezes, rhonchi, rales.  Breathing unlabored. Abdomen:Soft, minimally tender in right upper quadrant to palpation, ND, normal bowel sounds, no HSM, no masses.  No peritoneal signs.  Assessment:Chronic cholecystitis  Diagnoses: 575.11 Chronic cholecystitis (Chronic cholecystitis)  Procedures: 99357 - OFFICE OUTPATIENT VISIT 25 MINUTES    Plan:  Scheduled for laparoscopic cholecystectomy on 05/01/13.   Patient Education:Alternative treatments to surgery were discussed with patient (and family).  Risks and benefits  of procedure including bleeding, infection, hepatobiliary injury, and the possibility of an open procdure were fully explained to the patient (and family) who gave informed consent. Patient/family questions were addressed.  Follow-up:Pending Surgery

## 2013-04-26 NOTE — Patient Instructions (Signed)
Harold Nicholson  04/26/2013   Your procedure is scheduled on:  05/01/2013  Report to [facility] at  31  AM.  Call this number if you have problems the morning of surgery: [phone]   Remember:   Do not eat food or drink liquids after midnight.   Take these medicines the morning of surgery with A SIP OF WATER: fenofibrate, synthroid, claritihn, prilosec, oxycodone   Do not wear jewelry, make-up or nail polish.  Do not wear lotions, powders, or perfumes.   Do not shave 48 hours prior to surgery. Men may shave face and neck.  Do not bring valuables to the hospital.  [facility] is not responsible for any belongings or valuables.               Contacts, dentures or bridgework may not be worn into surgery.  Leave suitcase in the car. After surgery it may be brought to your room.  For patients admitted to the hospital, discharge time is determined by your treatment team.               Patients discharged the day of surgery will not be allowed to drive home.  Name and phone number of your driver: family  Special Instructions: Shower using CHG 2 nights before surgery and the night before surgery.  If you shower the day of surgery use CHG.  Use special wash - you have one bottle of CHG for all showers.  You should use approximately 1/3 of the bottle for each shower.   Please read over the following fact sheets that you were given: Pain Booklet, Coughing and Deep Breathing, Surgical Site Infection Prevention, Anesthesia Post-op Instructions and Care and Recovery After Surgery Laparoscopic Cholecystectomy Laparoscopic cholecystectomy is surgery to remove the gallbladder. The gallbladder is located in the upper right part of the abdomen, behind the liver. It is a storage sac for bile produced in the liver. Bile aids in the digestion and absorption of fats. Cholecystectomy is often done for inflammation of the gallbladder (cholecystitis). This condition is usually caused by a buildup of  gallstones (cholelithiasis) in your gallbladder. Gallstones can block the flow of bile, resulting in inflammation and pain. In severe cases, emergency surgery may be required. When emergency surgery is not required, you will have time to prepare for the procedure. Laparoscopic surgery is an alternative to open surgery. Laparoscopic surgery has a shorter recovery time. Your common bile duct may also need to be examined during the procedure. If stones are found in the common bile duct, they may be removed. LET [facility] CARE PROVIDER KNOW ABOUT:  Any allergies you have.  All medicines you are taking, including vitamins, herbs, eye drops, creams, and over-the-counter medicines.  Previous problems you or members of your family have had with the use of anesthetics.  Any blood disorders you have.  Previous surgeries you have had.  Medical conditions you have. RISKS AND COMPLICATIONS Generally, this is a safe procedure. However, as with any procedure, complications can occur. Possible complications include:  Infection.  Damage to the common bile duct, nerves, arteries, veins, or other internal organs such as the stomach, liver, or intestines.  Bleeding.  A stone may remain in the common bile duct.  A bile leak from the cyst duct that is clipped when your gallbladder is removed.  The need to convert to open surgery, which requires a larger incision in the abdomen. This may be necessary if your surgeon  thinks it is not safe to continue with a laparoscopic procedure. BEFORE THE PROCEDURE  Ask your health care provider about changing or stopping any regular medicines. You will need to stop taking aspirin or blood thinners at least 5 days prior to surgery.  Do not eat or drink anything after midnight the night before surgery.  Let your health care provider know if you develop a cold or other infectious problem before surgery. PROCEDURE   You will be given medicine to make you sleep  through the procedure (general anesthetic). A breathing tube will be placed in your mouth.  When you are asleep, your surgeon will make several small cuts (incisions) in your abdomen.  A thin, lighted tube with a tiny camera on the end (laparoscope) is inserted through one of the small incisions. The camera on the laparoscope sends a picture to a TV screen in the operating room. This gives the surgeon a good view inside your abdomen.  A gas will be pumped into your abdomen. This expands your abdomen so that the surgeon has more room to perform the surgery.  Other tools needed for the procedure are inserted through the other incisions. The gallbladder is removed through one of the incisions.  After the removal of your gallbladder, the incisions will be closed with stitches, staples, or skin glue. AFTER THE PROCEDURE  You will be taken to a recovery area where your progress will be checked often.  You may be allowed to go home the same day if your pain is controlled and you can tolerate liquids. Document Released: 02/02/2005 Document Revised: 11/23/2012 Document Reviewed: 09/14/2012 [facility] Patient Information 2014 [place]. PATIENT INSTRUCTIONS POST-ANESTHESIA  IMMEDIATELY FOLLOWING SURGERY:  Do not drive or operate machinery for the first twenty four hours after surgery.  Do not make any important decisions for twenty four hours after surgery or while taking narcotic pain medications or sedatives.  If you develop intractable nausea and vomiting or a severe headache please notify your doctor immediately.  FOLLOW-UP:  Please make an appointment with your surgeon as instructed. You do not need to follow up with anesthesia unless specifically instructed to do so.  WOUND CARE INSTRUCTIONS (if applicable):  Keep a dry clean dressing on the anesthesia/puncture wound site if there is drainage.  Once the wound has quit draining you may leave it open to air.  Generally you should leave the  bandage intact for twenty four hours unless there is drainage.  If the epidural site drains for more than 36-48 hours please call the anesthesia department.  QUESTIONS?:  Please feel free to call your physician or the hospital operator if you have any questions, and they will be happy to assist you.

## 2013-04-26 NOTE — Pre-Procedure Instructions (Signed)
Patient given information to sign up for my chart at home. 

## 2013-04-26 NOTE — Progress Notes (Signed)
04/26/13 0916  OBSTRUCTIVE SLEEP APNEA  Have you ever been diagnosed with sleep apnea through a sleep study? No  Do you snore loudly (loud enough to be heard through closed doors)?  0  Do you often feel tired, fatigued, or sleepy during the daytime? 0  Has anyone observed you stop breathing during your sleep? 0  Do you have, or are you being treated for high blood pressure? 1  BMI more than 35 kg/m2? 1  Age over 61 years old? 1  Neck circumference greater than 40 cm/18 inches? 0  Gender: 1  Obstructive Sleep Apnea Score 4  Score 4 or greater  Results sent to PCP

## 2013-04-28 NOTE — OR Nursing (Signed)
EKG sinus brady rate 50 shown to Dr. Patsey Berthold   No  further orders.

## 2013-05-01 ENCOUNTER — Encounter (HOSPITAL_COMMUNITY)
Admission: RE | Disposition: A | Discharge status: Home / Self Care | Payer: Self-pay | Source: Ambulatory Visit | Attending: General Surgery

## 2013-05-01 ENCOUNTER — Ambulatory Visit (HOSPITAL_COMMUNITY)
Admission: RE | Admit: 2013-05-01 | Discharge: 2013-05-01 | Disposition: A | Discharge status: Home / Self Care | Payer: Medicare HMO | Source: Ambulatory Visit | Attending: General Surgery | Admitting: General Surgery

## 2013-05-01 ENCOUNTER — Encounter (HOSPITAL_COMMUNITY): Payer: Medicare HMO | Admitting: Anesthesiology

## 2013-05-01 ENCOUNTER — Encounter (HOSPITAL_COMMUNITY): Payer: Self-pay | Admitting: Emergency Medicine

## 2013-05-01 ENCOUNTER — Encounter (HOSPITAL_COMMUNITY): Payer: Self-pay | Admitting: *Deleted

## 2013-05-01 ENCOUNTER — Emergency Department (HOSPITAL_COMMUNITY)
Admission: EM | Admit: 2013-05-01 | Discharge: 2013-05-01 | Disposition: A | Discharge status: Home / Self Care | Payer: Medicare HMO | Attending: Emergency Medicine | Admitting: Emergency Medicine

## 2013-05-01 ENCOUNTER — Ambulatory Visit (HOSPITAL_COMMUNITY): Payer: Medicare HMO | Admitting: Anesthesiology

## 2013-05-01 DIAGNOSIS — I10 Essential (primary) hypertension: Secondary | ICD-10-CM | POA: Insufficient documentation

## 2013-05-01 DIAGNOSIS — Z79899 Other long term (current) drug therapy: Secondary | ICD-10-CM | POA: Insufficient documentation

## 2013-05-01 DIAGNOSIS — Z8719 Personal history of other diseases of the digestive system: Secondary | ICD-10-CM | POA: Insufficient documentation

## 2013-05-01 DIAGNOSIS — E7800 Pure hypercholesterolemia, unspecified: Secondary | ICD-10-CM | POA: Insufficient documentation

## 2013-05-01 DIAGNOSIS — Z9089 Acquired absence of other organs: Secondary | ICD-10-CM | POA: Insufficient documentation

## 2013-05-01 DIAGNOSIS — R109 Unspecified abdominal pain: Secondary | ICD-10-CM | POA: Insufficient documentation

## 2013-05-01 DIAGNOSIS — R339 Retention of urine, unspecified: Secondary | ICD-10-CM | POA: Insufficient documentation

## 2013-05-01 DIAGNOSIS — K449 Diaphragmatic hernia without obstruction or gangrene: Secondary | ICD-10-CM | POA: Insufficient documentation

## 2013-05-01 DIAGNOSIS — E039 Hypothyroidism, unspecified: Secondary | ICD-10-CM | POA: Insufficient documentation

## 2013-05-01 DIAGNOSIS — K738 Other chronic hepatitis, not elsewhere classified: Secondary | ICD-10-CM | POA: Insufficient documentation

## 2013-05-01 DIAGNOSIS — K7460 Unspecified cirrhosis of liver: Secondary | ICD-10-CM | POA: Insufficient documentation

## 2013-05-01 DIAGNOSIS — K824 Cholesterolosis of gallbladder: Secondary | ICD-10-CM | POA: Insufficient documentation

## 2013-05-01 DIAGNOSIS — K811 Chronic cholecystitis: Secondary | ICD-10-CM | POA: Insufficient documentation

## 2013-05-01 DIAGNOSIS — Z8739 Personal history of other diseases of the musculoskeletal system and connective tissue: Secondary | ICD-10-CM | POA: Insufficient documentation

## 2013-05-01 HISTORY — PX: CHOLECYSTECTOMY: SHX55

## 2013-05-01 HISTORY — PX: LIVER BIOPSY: SHX301

## 2013-05-01 LAB — URINALYSIS, ROUTINE W REFLEX MICROSCOPIC
Bilirubin Urine: NEGATIVE
Glucose, UA: NEGATIVE mg/dL
Ketones, ur: NEGATIVE mg/dL
LEUKOCYTES UA: NEGATIVE
Nitrite: NEGATIVE
PROTEIN: NEGATIVE mg/dL
Specific Gravity, Urine: >1.03 — ABNORMAL HIGH (ref 1.005–1.030)
Urobilinogen, UA: 0.2 mg/dL (ref 0.0–1.0)
pH: 5.5 (ref 5.0–8.0)

## 2013-05-01 LAB — URINE MICROSCOPIC-ADD ON

## 2013-05-01 SURGERY — LAPAROSCOPIC CHOLECYSTECTOMY
Anesthesia: General

## 2013-05-01 MED ORDER — CLINDAMYCIN PHOSPHATE 900 MG/50ML IV SOLN
INTRAVENOUS | Status: AC
  Filled 2013-05-01: qty 50

## 2013-05-01 MED ORDER — BUPIVACAINE HCL (PF) 0.5 % IJ SOLN
INTRAMUSCULAR | Status: AC
  Filled 2013-05-01: qty 30

## 2013-05-01 MED ORDER — PROPOFOL 10 MG/ML IV BOLUS
INTRAVENOUS | Status: DC | PRN
  Administered 2013-05-01: 160 mg via INTRAVENOUS
  Administered 2013-05-01: 40 mg via INTRAVENOUS

## 2013-05-01 MED ORDER — MIDAZOLAM HCL 2 MG/2ML IJ SOLN
1.0000 mg | INTRAMUSCULAR | Status: DC | PRN
  Administered 2013-05-01 (×2): 2 mg via INTRAVENOUS

## 2013-05-01 MED ORDER — HYDRALAZINE HCL 20 MG/ML IJ SOLN
INTRAMUSCULAR | Status: DC | PRN
  Administered 2013-05-01: 2 mg via INTRAVENOUS
  Administered 2013-05-01: 3 mg via INTRAVENOUS
  Administered 2013-05-01: 2 mg via INTRAVENOUS
  Administered 2013-05-01: 3 mg via INTRAVENOUS

## 2013-05-01 MED ORDER — ALBUTEROL SULFATE HFA 108 (90 BASE) MCG/ACT IN AERS
INHALATION_SPRAY | RESPIRATORY_TRACT | Status: AC
Start: 2013-05-01 — End: 2013-05-01
  Filled 2013-05-01: qty 6.7

## 2013-05-01 MED ORDER — NEOSTIGMINE METHYLSULFATE 1 MG/ML IJ SOLN
INTRAMUSCULAR | Status: DC | PRN
  Administered 2013-05-01 (×4): 1 mg via INTRAVENOUS
  Administered 2013-05-01: 3 mg via INTRAVENOUS

## 2013-05-01 MED ORDER — MIDAZOLAM HCL 2 MG/2ML IJ SOLN
INTRAMUSCULAR | Status: AC
  Filled 2013-05-01: qty 2

## 2013-05-01 MED ORDER — FENTANYL CITRATE 0.05 MG/ML IJ SOLN
INTRAMUSCULAR | Status: DC | PRN
  Administered 2013-05-01: 50 ug via INTRAVENOUS
  Administered 2013-05-01: 25 ug via INTRAVENOUS
  Administered 2013-05-01 (×2): 50 ug via INTRAVENOUS

## 2013-05-01 MED ORDER — GLYCOPYRROLATE 0.2 MG/ML IJ SOLN
INTRAMUSCULAR | Status: AC
  Filled 2013-05-01: qty 2

## 2013-05-01 MED ORDER — NEOSTIGMINE METHYLSULFATE 1 MG/ML IJ SOLN
INTRAMUSCULAR | Status: AC
  Filled 2013-05-01: qty 1

## 2013-05-01 MED ORDER — EPHEDRINE SULFATE 50 MG/ML IJ SOLN
INTRAMUSCULAR | Status: AC
  Filled 2013-05-01: qty 1

## 2013-05-01 MED ORDER — ONDANSETRON HCL 4 MG/2ML IJ SOLN
4.0000 mg | Freq: Once | INTRAMUSCULAR | Status: AC
Start: 2013-05-01 — End: 2013-05-01
  Administered 2013-05-01: 4 mg via INTRAVENOUS

## 2013-05-01 MED ORDER — PROPOFOL 10 MG/ML IV BOLUS
INTRAVENOUS | Status: AC
  Filled 2013-05-01: qty 20

## 2013-05-01 MED ORDER — LIDOCAINE HCL (CARDIAC) 10 MG/ML IV SOLN
INTRAVENOUS | Status: DC | PRN
  Administered 2013-05-01: 20 mg via INTRAVENOUS

## 2013-05-01 MED ORDER — EPHEDRINE SULFATE 50 MG/ML IJ SOLN
INTRAMUSCULAR | Status: DC | PRN
  Administered 2013-05-01: 5 mg via INTRAVENOUS

## 2013-05-01 MED ORDER — GLYCOPYRROLATE 0.2 MG/ML IJ SOLN
INTRAMUSCULAR | Status: DC | PRN
  Administered 2013-05-01: 0.2 mg via INTRAVENOUS
  Administered 2013-05-01: 0.6 mg via INTRAVENOUS
  Administered 2013-05-01: 0.2 mg via INTRAVENOUS

## 2013-05-01 MED ORDER — NALOXONE HCL 0.4 MG/ML IJ SOLN
INTRAMUSCULAR | Status: DC | PRN
  Administered 2013-05-01: 0.1 mg via INTRAVENOUS

## 2013-05-01 MED ORDER — SODIUM CHLORIDE 0.9 % IR SOLN
Status: DC | PRN
  Administered 2013-05-01: 1000 mL

## 2013-05-01 MED ORDER — CLINDAMYCIN PHOSPHATE 900 MG/50ML IV SOLN
900.0000 mg | Freq: Once | INTRAVENOUS | Status: AC
  Administered 2013-05-01: 900 mg via INTRAVENOUS

## 2013-05-01 MED ORDER — GLYCOPYRROLATE 0.2 MG/ML IJ SOLN
INTRAMUSCULAR | Status: AC
  Filled 2013-05-01: qty 1

## 2013-05-01 MED ORDER — POVIDONE-IODINE 10 % OINT PACKET
TOPICAL_OINTMENT | CUTANEOUS | Status: DC | PRN
  Administered 2013-05-01: 1 via TOPICAL

## 2013-05-01 MED ORDER — HEMOSTATIC AGENTS (NO CHARGE) OPTIME
TOPICAL | Status: DC | PRN
  Administered 2013-05-01: 1 via TOPICAL

## 2013-05-01 MED ORDER — ALBUTEROL SULFATE HFA 108 (90 BASE) MCG/ACT IN AERS
INHALATION_SPRAY | RESPIRATORY_TRACT | Status: DC | PRN
  Administered 2013-05-01: 3 via RESPIRATORY_TRACT
  Administered 2013-05-01: 2 via RESPIRATORY_TRACT

## 2013-05-01 MED ORDER — POVIDONE-IODINE 10 % EX OINT
TOPICAL_OINTMENT | CUTANEOUS | Status: AC
  Filled 2013-05-01: qty 1

## 2013-05-01 MED ORDER — SODIUM CHLORIDE BACTERIOSTATIC 0.9 % IJ SOLN
INTRAMUSCULAR | Status: AC
  Filled 2013-05-01: qty 10

## 2013-05-01 MED ORDER — KETOROLAC TROMETHAMINE 30 MG/ML IJ SOLN
30.0000 mg | Freq: Once | INTRAMUSCULAR | Status: AC
  Administered 2013-05-01: 30 mg via INTRAVENOUS
  Filled 2013-05-01: qty 1

## 2013-05-01 MED ORDER — FENTANYL CITRATE 0.05 MG/ML IJ SOLN
INTRAMUSCULAR | Status: AC
Start: 2013-05-01 — End: 2013-05-01
  Filled 2013-05-01: qty 5

## 2013-05-01 MED ORDER — NALOXONE HCL 0.4 MG/ML IJ SOLN
INTRAMUSCULAR | Status: AC
  Filled 2013-05-01: qty 1

## 2013-05-01 MED ORDER — OXYCODONE-ACETAMINOPHEN 5-325 MG PO TABS: 1.0000 | ORAL_TABLET | ORAL | Status: DC | PRN

## 2013-05-01 MED ORDER — ROCURONIUM BROMIDE 50 MG/5ML IV SOLN
INTRAVENOUS | Status: AC
  Filled 2013-05-01: qty 1

## 2013-05-01 MED ORDER — CHLORHEXIDINE GLUCONATE 4 % EX LIQD: 1.0000 "application " | Freq: Once | CUTANEOUS | Status: DC

## 2013-05-01 MED ORDER — GLYCOPYRROLATE 0.2 MG/ML IJ SOLN
0.2000 mg | Freq: Once | INTRAMUSCULAR | Status: AC
Start: 2013-05-01 — End: 2013-05-01
  Administered 2013-05-01: 0.2 mg via INTRAVENOUS

## 2013-05-01 MED ORDER — HYDRALAZINE HCL 20 MG/ML IJ SOLN
INTRAMUSCULAR | Status: AC
  Filled 2013-05-01: qty 1

## 2013-05-01 MED ORDER — LIDOCAINE HCL (PF) 1 % IJ SOLN
INTRAMUSCULAR | Status: AC
  Filled 2013-05-01: qty 5

## 2013-05-01 MED ORDER — ALBUTEROL SULFATE (2.5 MG/3ML) 0.083% IN NEBU
2.5000 mg | INHALATION_SOLUTION | Freq: Once | RESPIRATORY_TRACT | Status: AC
  Administered 2013-05-01: 2.5 mg via RESPIRATORY_TRACT

## 2013-05-01 MED ORDER — SUCCINYLCHOLINE CHLORIDE 20 MG/ML IJ SOLN
INTRAMUSCULAR | Status: DC | PRN
  Administered 2013-05-01: 140 mg via INTRAVENOUS

## 2013-05-01 MED ORDER — FENTANYL CITRATE 0.05 MG/ML IJ SOLN
INTRAMUSCULAR | Status: AC
  Filled 2013-05-01: qty 2

## 2013-05-01 MED ORDER — ROCURONIUM BROMIDE 100 MG/10ML IV SOLN
INTRAVENOUS | Status: DC | PRN
  Administered 2013-05-01: 25 mg via INTRAVENOUS
  Administered 2013-05-01: 5 mg via INTRAVENOUS

## 2013-05-01 MED ORDER — BUPIVACAINE HCL (PF) 0.5 % IJ SOLN
INTRAMUSCULAR | Status: DC | PRN
  Administered 2013-05-01: 10 mL

## 2013-05-01 MED ORDER — ONDANSETRON HCL 4 MG/2ML IJ SOLN
INTRAMUSCULAR | Status: AC
  Filled 2013-05-01: qty 2

## 2013-05-01 MED ORDER — ALBUTEROL SULFATE (2.5 MG/3ML) 0.083% IN NEBU
INHALATION_SOLUTION | RESPIRATORY_TRACT | Status: AC
  Filled 2013-05-01: qty 3

## 2013-05-01 MED ORDER — FENTANYL CITRATE 0.05 MG/ML IJ SOLN
25.0000 ug | INTRAMUSCULAR | Status: DC | PRN
  Administered 2013-05-01 (×2): 25 ug via INTRAVENOUS

## 2013-05-01 MED ORDER — SUCCINYLCHOLINE CHLORIDE 20 MG/ML IJ SOLN
INTRAMUSCULAR | Status: AC
  Filled 2013-05-01: qty 1

## 2013-05-01 MED ORDER — LACTATED RINGERS IV SOLN
INTRAVENOUS | Status: DC
  Administered 2013-05-01: 08:00:00 via INTRAVENOUS
  Administered 2013-05-01: 1000 mL via INTRAVENOUS

## 2013-05-01 MED ORDER — ONDANSETRON HCL 4 MG/2ML IJ SOLN: 4.0000 mg | Freq: Once | INTRAMUSCULAR | Status: DC | PRN

## 2013-05-01 SURGICAL SUPPLY — 48 items
APPLIER CLIP LAPSCP 10X32 DD (CLIP) ×3 IMPLANT
BAG HAMPER (MISCELLANEOUS) ×3 IMPLANT
CLOTH BEACON ORANGE TIMEOUT ST (SAFETY) ×3 IMPLANT
COVER LIGHT HANDLE STERIS (MISCELLANEOUS) ×6 IMPLANT
DECANTER SPIKE VIAL GLASS SM (MISCELLANEOUS) ×3 IMPLANT
DURAPREP 26ML APPLICATOR (WOUND CARE) ×3 IMPLANT
ELECT REM PT RETURN 9FT ADLT (ELECTROSURGICAL) ×3
ELECTRODE REM PT RTRN 9FT ADLT (ELECTROSURGICAL) ×1 IMPLANT
FILTER SMOKE EVAC LAPAROSHD (FILTER) ×3 IMPLANT
FORMALIN 10 PREFIL 120ML (MISCELLANEOUS) ×3 IMPLANT
GLOVE BIO SURGEON STRL SZ7.5 (GLOVE) ×3 IMPLANT
GLOVE BIOGEL PI IND STRL 7.0 (GLOVE) ×2 IMPLANT
GLOVE BIOGEL PI IND STRL 7.5 (GLOVE) ×2 IMPLANT
GLOVE BIOGEL PI IND STRL 8 (GLOVE) ×1 IMPLANT
GLOVE BIOGEL PI INDICATOR 7.0 (GLOVE) ×4
GLOVE BIOGEL PI INDICATOR 7.5 (GLOVE) ×4
GLOVE BIOGEL PI INDICATOR 8 (GLOVE) ×2
GLOVE ECLIPSE 6.5 STRL STRAW (GLOVE) IMPLANT
GLOVE ECLIPSE 7.0 STRL STRAW (GLOVE) IMPLANT
GLOVE SS BIOGEL STRL SZ 6.5 (GLOVE) ×1 IMPLANT
GLOVE SUPERSENSE BIOGEL SZ 6.5 (GLOVE) ×2
GOWN STRL REUS W/TWL LRG LVL3 (GOWN DISPOSABLE) ×9 IMPLANT
HEMOSTAT SNOW SURGICEL 2X4 (HEMOSTASIS) ×3 IMPLANT
INST SET LAPROSCOPIC AP (KITS) ×3 IMPLANT
IV NS IRRIG 3000ML ARTHROMATIC (IV SOLUTION) IMPLANT
KIT ROOM TURNOVER APOR (KITS) ×3 IMPLANT
MANIFOLD NEPTUNE II (INSTRUMENTS) ×3 IMPLANT
NEEDLE BIOPSY 14X6 SOFT TISS (NEEDLE) ×3 IMPLANT
NEEDLE INSUFFLATION 14GA 120MM (NEEDLE) ×3 IMPLANT
NS IRRIG 1000ML POUR BTL (IV SOLUTION) ×3 IMPLANT
PACK LAP CHOLE LZT030E (CUSTOM PROCEDURE TRAY) ×3 IMPLANT
PAD ARMBOARD 7.5X6 YLW CONV (MISCELLANEOUS) ×3 IMPLANT
PAD TELFA 3X4 1S STER (GAUZE/BANDAGES/DRESSINGS) ×3 IMPLANT
POUCH SPECIMEN RETRIEVAL 10MM (ENDOMECHANICALS) ×3 IMPLANT
SET BASIN LINEN APH (SET/KITS/TRAYS/PACK) ×3 IMPLANT
SET TUBE IRRIG SUCTION NO TIP (IRRIGATION / IRRIGATOR) IMPLANT
SLEEVE ENDOPATH XCEL 5M (ENDOMECHANICALS) ×3 IMPLANT
SPONGE GAUZE 2X2 8PLY STER LF (GAUZE/BANDAGES/DRESSINGS) ×4
SPONGE GAUZE 2X2 8PLY STRL LF (GAUZE/BANDAGES/DRESSINGS) ×8 IMPLANT
STAPLER VISISTAT (STAPLE) ×3 IMPLANT
SUT VICRYL 0 UR6 27IN ABS (SUTURE) ×3 IMPLANT
TAPE CLOTH SURG 4X10 WHT LF (GAUZE/BANDAGES/DRESSINGS) ×3 IMPLANT
TROCAR ENDO BLADELESS 11MM (ENDOMECHANICALS) ×3 IMPLANT
TROCAR XCEL NON-BLD 5MMX100MML (ENDOMECHANICALS) ×3 IMPLANT
TROCAR XCEL UNIV SLVE 11M 100M (ENDOMECHANICALS) ×3 IMPLANT
TUBING INSUFFLATION (TUBING) ×3 IMPLANT
WARMER LAPAROSCOPE (MISCELLANEOUS) ×3 IMPLANT
YANKAUER SUCT 12FT TUBE ARGYLE (SUCTIONS) ×3 IMPLANT

## 2013-05-01 NOTE — ED Notes (Signed)
Pt reports urinary retention since this morning. Pt states he has the urgency but is unable to void. Pt had his gallbladder removed this morning and states he has been unable to void since.

## 2013-05-01 NOTE — Op Note (Signed)
Patient:  Harold Nicholson  DOB:  Apr 22, 1950  MRN:  [id number]   Preop Diagnosis:  Chronic cholecystitis  Postop Diagnosis:  Same, micronodular cirrhosis  Procedure:  Laparoscopic cholecystectomy, liver biopsy  Surgeon:  Aviva Signs, M.D.  Anes:  General endotracheal  Indications:  Patient is a 63 year old white male who presents with biliary colic secondary to chronic cholecystitis. The risks and benefits of the procedure including bleeding, infection, hepatobiliary, and the possibility of an open procedure were fully explained to the patient, who gave informed consent.  Procedure note:  The patient is placed the supine position. After induction of general endotracheal anesthesia, the abdomen was prepped and draped using usual sterile technique with DuraPrep. Surgical site confirmation was performed.  A supraumbilical incision was made down to the fascia. A Veress needle was introduced into the abdominal cavity and confirmation of placement was done using the saline drop test. The abdomen was then insufflated to 16 mm mercury pressure. An 11 mm trocar was introduced into the abdominal cavity under direct visualization without difficulty. The patient was placed in reverse Trendelenburg position and additional 11 mm trocar was placed the epigastric region and 5 mm trochars were placed the right upper quadrant and right flank regions. The liver was inspected and noted to be diffusely involved with what appeared to be micronodular cirrhosis.  Trucut liver biopsies were performed on the right lobe of liver. They were sent to pathology for further examination. The puncture sites were cauterized using Bovie electrocautery. The gallbladder was retracted in a dynamic fashion in order to expose the triangle of Calot. The cystic duct was first identified. Its juncture to the infundibulum was fully identified. Endoclips were placed proximally and distally on the cystic duct, and the cystic duct was divided.  This was likewise done to the cystic artery. The gallbladder was then freed away from the gallbladder fossa using Bovie electrocautery. The gallbladder was delivered through the epigastric trocar site using an Endo Catch bag. The gallbladder fossa was inspected and no abnormal bleeding or bile leakage was noted. Surgicel was placed in the gallbladder fossa. All fluid and air were then evacuated from the abdominal cavity prior to removal of the trochars.  All wounds were irrigated with normal saline. All wounds were injected with 0.5% Sensorcaine. The supraumbilical fascia as well as epigastric fascia reapproximated using 0 Vicryl interrupted sutures. All skin incisions were closed using staples. Betadine ointment and dry sterile dressings were applied.  All tape and needle counts were correct at the end of the procedure. Patient was extubated in the operating room and transferred to PACU in stable condition.  Complications:  None  EBL:  Minimal  Specimen:  Gallbladder, liver biopsy

## 2013-05-01 NOTE — Transfer of Care (Signed)
Immediate Anesthesia Transfer of Care Note  Patient: Harold Nicholson  Procedure(s) Performed: Procedure(s) (LRB): LAPAROSCOPIC CHOLECYSTECTOMY (N/A) LIVER BIOPSY (N/A)  Patient Location: PACU  Anesthesia Type: General  Level of Consciousness: awake  Airway & Oxygen Therapy: Patient Spontanous Breathing and non-rebreather face mask  Post-op Assessment: Report given to PACU RN, Post -op Vital signs reviewed and stable and Patient moving all extremities  Post vital signs: Reviewed and stable  Complications: No apparent anesthesia complications

## 2013-05-01 NOTE — Anesthesia Preprocedure Evaluation (Addendum)
Anesthesia Evaluation  Patient identified by MRN, date of birth, ID band Patient awake    Reviewed: Allergy & Precautions, H&P , NPO status , Patient's Chart, lab work & pertinent test results  Airway Mallampati: II TM Distance: >3 FB     Dental  (+) Teeth Intact, Poor Dentition   Pulmonary neg pulmonary ROS,  breath sounds clear to auscultation        Cardiovascular hypertension, Pt. on medications Rhythm:Regular Rate:Normal     Neuro/Psych    GI/Hepatic hiatal hernia,   Endo/Other  Hypothyroidism   Renal/GU      Musculoskeletal   Abdominal   Peds  Hematology   Anesthesia Other Findings   Reproductive/Obstetrics                          Anesthesia Physical Anesthesia Plan  ASA: II  Anesthesia Plan: General   Post-op Pain Management:    Induction: Intravenous, Rapid sequence and Cricoid pressure planned  Airway Management Planned: Oral ETT  Additional Equipment:   Intra-op Plan:   Post-operative Plan: Extubation in OR  Informed Consent: I have reviewed the patients History and Physical, chart, labs and discussed the procedure including the risks, benefits and alternatives for the proposed anesthesia with the patient or authorized representative who has indicated his/her understanding and acceptance.     Plan Discussed with:   Anesthesia Plan Comments: (05/01/2013 0903 Prolonged emergence. See intra-op record. T.Voula Waln CRNA)       Anesthesia Quick Evaluation

## 2013-05-01 NOTE — Anesthesia Procedure Notes (Signed)
Procedure Name: Intubation Date/Time: 05/01/2013 7:39 AM Performed by: Vista Deck Pre-anesthesia Checklist: Patient identified, Patient being monitored, Timeout performed, Emergency Drugs available and Suction available Patient Re-evaluated:Patient Re-evaluated prior to inductionOxygen Delivery Method: Circle System Utilized Preoxygenation: Pre-oxygenation with 100% oxygen Intubation Type: IV induction, Rapid sequence and Cricoid Pressure applied Ventilation: Oral airway inserted - appropriate to patient size Laryngoscope Size: Sabra Heck and 2 Grade View: Grade III Tube type: Oral Tube size: 7.0 mm Number of attempts: 1 Airway Equipment and Method: stylet Placement Confirmation: ETT inserted through vocal cords under direct vision,  positive ETCO2 and breath sounds checked- equal and bilateral Secured at: 21 cm Tube secured with: Tape Dental Injury: Teeth and Oropharynx as per pre-operative assessment

## 2013-05-01 NOTE — ED Provider Notes (Signed)
CSN: [id number]     Arrival date & time 05/01/13  1638 History   First MD Initiated Contact with Patient 05/01/13 1700     Chief Complaint  Patient presents with  . Urinary Retention     (Consider location/radiation/quality/duration/timing/severity/associated sxs/prior Treatment) Patient is a 63 y.o. male presenting with abdominal pain. The history is provided by the patient (the pt complains of urinary retention today after surgery).  Abdominal Pain Pain location:  Suprapubic Pain quality: aching   Pain radiates to:  Does not radiate Pain severity:  Mild Onset quality:  Sudden Timing:  Constant Associated symptoms: no chest pain, no cough, no diarrhea, no fatigue and no hematuria     Past Medical History  Diagnosis Date  . Hypertension   . Hypercholesteremia   . Hiatal hernia   . Pure hyperglyceridemia   . Thyroid disease     Hypothyroid   . Impaired fasting glucose   . Arthritis   . Hypothyroidism    Past Surgical History  Procedure Laterality Date  . Right knee      X 2  . Left knee arthroscopy    . Colonoscopy  10/07/2010    Procedure: COLONOSCOPY;  Surgeon: Jamesetta So;  Location: [facility];  Service: Gastroenterology;  Laterality: N/A;  . Esophagogastroduodenoscopy  10/07/2010    Procedure: ESOPHAGOGASTRODUODENOSCOPY (EGD);  Surgeon: Jamesetta So;  Location: [facility];  Service: Gastroenterology;  Laterality: N/A;  . Joint replacement      Right knee  . Cholecystectomy     Family History  Problem Relation Age of Onset  . Diabetes Mother   . Hyperlipidemia Mother   . Hypertension Mother   . Hyperlipidemia Father   . Hypertension Father   . Diabetes Sister   . Hearing loss Sister   . Hyperlipidemia Sister   . Hypertension Sister   . Diabetes Brother   . Hyperlipidemia Brother   . Hypertension Brother    History  Substance Use Topics  . Smoking status: Never Smoker   . Smokeless tobacco: Current User    Types: Snuff  . Alcohol Use: No     Comment: quit drinking 3 years ago    Review of Systems  Constitutional: Negative for appetite change and fatigue.  HENT: Negative for congestion, ear discharge and sinus pressure.   Eyes: Negative for discharge.  Respiratory: Negative for cough.   Cardiovascular: Negative for chest pain.  Gastrointestinal: Positive for abdominal pain. Negative for diarrhea.  Genitourinary: Negative for frequency and hematuria.       Urinary retention  Musculoskeletal: Negative for back pain.  Skin: Negative for rash.  Neurological: Negative for seizures and headaches.  Psychiatric/Behavioral: Negative for hallucinations.      Allergies  Review of patient's allergies indicates no known allergies.  Home Medications   Current Outpatient Rx  Name  Route  Sig  Dispense  Refill  . bisoprolol-hydrochlorothiazide (ZIAC) 10-6.25 MG per tablet   Oral   Take 1 tablet by mouth daily.           . cholecalciferol (VITAMIN D) 1000 UNITS tablet   Oral   Take 1,000 Units by mouth daily.         . fenofibrate 160 MG tablet   Oral   Take 160 mg by mouth daily.           . hydrochlorothiazide 25 MG tablet   Oral   Take 12.5 mg by mouth daily.          Marland Kitchen  levothyroxine (SYNTHROID, LEVOTHROID) 175 MCG tablet   Oral   Take 175 mcg by mouth daily before breakfast.         . loratadine (CLARITIN) 10 MG tablet   Oral   Take 10 mg by mouth daily.           Marland Kitchen omeprazole (PRILOSEC) 40 MG capsule   Oral   Take 40 mg by mouth daily.           . ondansetron (ZOFRAN-ODT) 8 MG disintegrating tablet   Oral   Take 8 mg by mouth every 4 (four) hours as needed for nausea or vomiting.         Marland Kitchen oxyCODONE-acetaminophen (PERCOCET) 5-325 MG per tablet   Oral   Take 1-2 tablets by mouth every 4 (four) hours as needed.   50 tablet   0   . Potassium (POTASSIMIN PO)   Oral   Take 2 tablets by mouth daily.          BP 128/81  Pulse 58  Temp(Src) 97.8 F (36.6 C) (Oral)  Resp 18  Ht 5'  11" (1.803 m)  Wt 278 lb (126.1 kg)  BMI 38.79 kg/m2  SpO2 97% Physical Exam  Constitutional: He is oriented to person, place, and time. He appears well-developed.  HENT:  Head: Normocephalic.  Eyes: Conjunctivae and EOM are normal. No scleral icterus.  Neck: Neck supple. No thyromegaly present.  Cardiovascular: Normal rate and regular rhythm.  Exam reveals no gallop and no friction rub.   No murmur heard. Pulmonary/Chest: No stridor. He has no wheezes. He has no rales. He exhibits no tenderness.  Abdominal: He exhibits no distension. There is tenderness. There is no rebound.  Musculoskeletal: Normal range of motion. He exhibits no edema.  Lymphadenopathy:    He has no cervical adenopathy.  Neurological: He is oriented to person, place, and time. He exhibits normal muscle tone. Coordination normal.  Skin: No rash noted. No erythema.  Psychiatric: He has a normal mood and affect. His behavior is normal.    ED Course  Procedures (including critical care time) Labs Review Labs Reviewed  URINALYSIS, ROUTINE W REFLEX MICROSCOPIC - Abnormal; Notable for the following:    APPearance HAZY (*)    Specific Gravity, Urine >1.030 (*)    Hgb urine dipstick TRACE (*)    All other components within normal limits  URINE MICROSCOPIC-ADD ON - Abnormal; Notable for the following:    Squamous Epithelial / LPF FEW (*)    Bacteria, UA MANY (*)    Casts GRANULAR CAST (*)    All other components within normal limits   Imaging Review No results found.   EKG Interpretation None      MDM   Final diagnoses:  Urinary retention    Pt improved with foley    Maudry Diego, MD 05/01/13 1836

## 2013-05-01 NOTE — Discharge Instructions (Signed)
Follow up with Dr. Zannie Cove or dr. Jeffie Pollock this week.

## 2013-05-01 NOTE — Anesthesia Postprocedure Evaluation (Signed)
  Anesthesia Post-op Note  Patient: Harold Nicholson  Procedure(s) Performed: Procedure(s): LAPAROSCOPIC CHOLECYSTECTOMY (N/A) LIVER BIOPSY (N/A)  Patient Location: PACU  Anesthesia Type:General  Level of Consciousness: awake, oriented and patient cooperative  Airway and Oxygen Therapy: Patient Spontanous Breathing and non-rebreather face mask  Post-op Pain: moderate  Post-op Assessment: Post-op Vital signs reviewed, Patient's Cardiovascular Status Stable, Respiratory Function Stable, Patent Airway and No signs of Nausea or vomiting  Post-op Vital Signs: Reviewed and stable  Complications: prolonged emergence

## 2013-05-01 NOTE — Interval H&P Note (Signed)
History and Physical Interval Note:  05/01/2013 7:21 AM  Harold Nicholson  has presented today for surgery, with the diagnosis of chronic cholecystitis  The various methods of treatment have been discussed with the patient and family. After consideration of risks, benefits and other options for treatment, the patient has consented to  Procedure(s): LAPAROSCOPIC CHOLECYSTECTOMY (N/A) as a surgical intervention .  The patient's history has been reviewed, patient examined, no change in status, stable for surgery.  I have reviewed the patient's chart and labs.  Questions were answered to the patient's satisfaction.     Aviva Signs A

## 2013-05-01 NOTE — Progress Notes (Signed)
Dr Arnoldo Morale in to assess pt. OK to go home.

## 2013-05-01 NOTE — ED Notes (Signed)
Pt had laparoscopic cholecystectomy this morning and has been unable to urinate since then.

## 2013-05-01 NOTE — Discharge Instructions (Signed)
Laparoscopic Cholecystectomy, Care After °Refer to this sheet in the next few weeks. These instructions provide you with information on caring for yourself after your procedure. Your health care provider may also give you more specific instructions. Your treatment has been planned according to current medical practices, but problems sometimes occur. Call your health care provider if you have any problems or questions after your procedure. °WHAT TO EXPECT AFTER THE PROCEDURE °After your procedure, it is typical to have the following: °· Pain at your incision sites. You will be given pain medicines to control the pain. °· Mild nausea or vomiting. This should improve after the first 24 hours. °· Bloating and possibly shoulder pain from the gas used during the procedure. This will improve after the first 24 hours. °HOME CARE INSTRUCTIONS  °· Change bandages (dressings) as directed by your health care provider. °· Keep the wound dry and clean. You may wash the wound gently with soap and water. Gently blot or dab the area dry. °· Do not take baths or use swimming pools or hot tubs for 2 weeks or until your health care provider approves. °· Only take over-the-counter or prescription medicines as directed by your health care provider. °· Continue your normal diet as directed by your health care provider. °· Do not lift anything heavier than 10 pounds (4.5 kg) until your health care provider approves. °· Do not play contact sports for 1 week or until your health care provider approves. °SEEK MEDICAL CARE IF:  °· You have redness, swelling, or increasing pain in the wound. °· You notice yellowish-white fluid (pus) coming from the wound. °· You have drainage from the wound that lasts longer than 1 day. °· You notice a bad smell coming from the wound or dressing. °· Your surgical cuts (incisions) break open. °SEEK IMMEDIATE MEDICAL CARE IF:  °· You develop a rash. °· You have difficulty breathing. °· You have chest pain. °· You  have a fever. °· You have increasing pain in the shoulders (shoulder strap areas). °· You have dizzy episodes or faint while standing. °· You have severe abdominal pain. °· You feel sick to your stomach (nauseous) or throw up (vomit) and this lasts for more than 1 day. °Document Released: 02/02/2005 Document Revised: 11/23/2012 Document Reviewed: 09/14/2012 °Exit[employer]® Patient Information ©2014 [employer]. ° °

## 2013-05-03 ENCOUNTER — Encounter (HOSPITAL_COMMUNITY): Payer: Self-pay | Admitting: General Surgery

## 2013-05-03 LAB — GLUCOSE, CAPILLARY: Glucose-Capillary: 87 mg/dL (ref 70–99)

## 2014-04-03 DIAGNOSIS — H5210 Myopia, unspecified eye: Secondary | ICD-10-CM | POA: Diagnosis not present

## 2014-04-03 DIAGNOSIS — H25019 Cortical age-related cataract, unspecified eye: Secondary | ICD-10-CM | POA: Diagnosis not present

## 2014-04-03 DIAGNOSIS — I10 Essential (primary) hypertension: Secondary | ICD-10-CM | POA: Diagnosis not present

## 2014-04-03 DIAGNOSIS — H5200 Hypermetropia, unspecified eye: Secondary | ICD-10-CM | POA: Diagnosis not present

## 2014-04-13 DIAGNOSIS — Z6837 Body mass index (BMI) 37.0-37.9, adult: Secondary | ICD-10-CM | POA: Diagnosis not present

## 2014-04-13 DIAGNOSIS — H269 Unspecified cataract: Secondary | ICD-10-CM | POA: Diagnosis not present

## 2014-05-29 DIAGNOSIS — H2511 Age-related nuclear cataract, right eye: Secondary | ICD-10-CM | POA: Diagnosis not present

## 2014-05-29 DIAGNOSIS — L908 Other atrophic disorders of skin: Secondary | ICD-10-CM | POA: Diagnosis not present

## 2014-05-29 DIAGNOSIS — I10 Essential (primary) hypertension: Secondary | ICD-10-CM | POA: Diagnosis not present

## 2014-05-29 DIAGNOSIS — H2512 Age-related nuclear cataract, left eye: Secondary | ICD-10-CM | POA: Diagnosis not present

## 2014-06-11 DIAGNOSIS — H25812 Combined forms of age-related cataract, left eye: Secondary | ICD-10-CM | POA: Diagnosis not present

## 2014-06-11 DIAGNOSIS — H2512 Age-related nuclear cataract, left eye: Secondary | ICD-10-CM | POA: Diagnosis not present

## 2014-06-12 DIAGNOSIS — H2511 Age-related nuclear cataract, right eye: Secondary | ICD-10-CM | POA: Diagnosis not present

## 2014-06-12 DIAGNOSIS — Z961 Presence of intraocular lens: Secondary | ICD-10-CM | POA: Diagnosis not present

## 2014-06-12 DIAGNOSIS — H25042 Posterior subcapsular polar age-related cataract, left eye: Secondary | ICD-10-CM | POA: Diagnosis not present

## 2014-06-25 DIAGNOSIS — Z961 Presence of intraocular lens: Secondary | ICD-10-CM | POA: Diagnosis not present

## 2014-06-25 DIAGNOSIS — Z9842 Cataract extraction status, left eye: Secondary | ICD-10-CM | POA: Diagnosis not present

## 2014-06-25 DIAGNOSIS — H2511 Age-related nuclear cataract, right eye: Secondary | ICD-10-CM | POA: Diagnosis not present

## 2014-06-25 DIAGNOSIS — H25811 Combined forms of age-related cataract, right eye: Secondary | ICD-10-CM | POA: Diagnosis not present

## 2014-08-23 DIAGNOSIS — E782 Mixed hyperlipidemia: Secondary | ICD-10-CM | POA: Diagnosis not present

## 2014-08-23 DIAGNOSIS — E039 Hypothyroidism, unspecified: Secondary | ICD-10-CM | POA: Diagnosis not present

## 2014-08-23 DIAGNOSIS — Z6836 Body mass index (BMI) 36.0-36.9, adult: Secondary | ICD-10-CM | POA: Diagnosis not present

## 2014-08-23 DIAGNOSIS — E6609 Other obesity due to excess calories: Secondary | ICD-10-CM | POA: Diagnosis not present

## 2014-08-23 DIAGNOSIS — Z1389 Encounter for screening for other disorder: Secondary | ICD-10-CM | POA: Diagnosis not present

## 2014-08-23 DIAGNOSIS — I10 Essential (primary) hypertension: Secondary | ICD-10-CM | POA: Diagnosis not present

## 2014-09-04 DIAGNOSIS — M179 Osteoarthritis of knee, unspecified: Secondary | ICD-10-CM | POA: Diagnosis not present

## 2014-09-04 DIAGNOSIS — M25562 Pain in left knee: Secondary | ICD-10-CM | POA: Insufficient documentation

## 2014-09-04 DIAGNOSIS — M1712 Unilateral primary osteoarthritis, left knee: Secondary | ICD-10-CM | POA: Diagnosis not present

## 2014-09-06 ENCOUNTER — Other Ambulatory Visit: Payer: Self-pay | Admitting: Orthopedic Surgery

## 2014-09-11 ENCOUNTER — Ambulatory Visit (HOSPITAL_COMMUNITY)
Admission: RE | Admit: 2014-09-11 | Discharge: 2014-09-11 | Disposition: A | Discharge status: Home / Self Care | Payer: Commercial Managed Care - HMO | Source: Ambulatory Visit | Attending: Orthopedic Surgery | Admitting: Orthopedic Surgery

## 2014-09-11 ENCOUNTER — Encounter (HOSPITAL_COMMUNITY): Payer: Self-pay

## 2014-09-11 ENCOUNTER — Encounter (HOSPITAL_COMMUNITY)
Admission: RE | Admit: 2014-09-11 | Discharge: 2014-09-11 | Disposition: A | Discharge status: Home / Self Care | Payer: Commercial Managed Care - HMO | Source: Ambulatory Visit | Attending: Orthopedic Surgery | Admitting: Orthopedic Surgery

## 2014-09-11 DIAGNOSIS — I10 Essential (primary) hypertension: Secondary | ICD-10-CM | POA: Diagnosis not present

## 2014-09-11 DIAGNOSIS — Z01812 Encounter for preprocedural laboratory examination: Secondary | ICD-10-CM | POA: Insufficient documentation

## 2014-09-11 DIAGNOSIS — R001 Bradycardia, unspecified: Secondary | ICD-10-CM | POA: Insufficient documentation

## 2014-09-11 DIAGNOSIS — Z01818 Encounter for other preprocedural examination: Secondary | ICD-10-CM | POA: Diagnosis not present

## 2014-09-11 DIAGNOSIS — M179 Osteoarthritis of knee, unspecified: Secondary | ICD-10-CM | POA: Diagnosis not present

## 2014-09-11 HISTORY — DX: Other complications of anesthesia, initial encounter: T88.59XA

## 2014-09-11 HISTORY — DX: Adverse effect of unspecified anesthetic, initial encounter: T41.45XA

## 2014-09-11 LAB — COMPREHENSIVE METABOLIC PANEL
ALT: 24 U/L (ref 17–63)
AST: 32 U/L (ref 15–41)
Albumin: 3.5 g/dL (ref 3.5–5.0)
Alkaline Phosphatase: 40 U/L (ref 38–126)
Anion gap: 8 (ref 5–15)
BILIRUBIN TOTAL: 0.8 mg/dL (ref 0.3–1.2)
BUN: 13 mg/dL (ref 6–20)
CO2: 22 mmol/L (ref 22–32)
CREATININE: 1.53 mg/dL — AB (ref 0.61–1.24)
Calcium: 9.2 mg/dL (ref 8.9–10.3)
Chloride: 107 mmol/L (ref 101–111)
GFR calc Af Amer: 55 mL/min — ABNORMAL LOW (ref >60)
GFR, EST NON AFRICAN AMERICAN: 47 mL/min — AB (ref >60)
Glucose, Bld: 106 mg/dL — ABNORMAL HIGH (ref 65–99)
Potassium: 4.2 mmol/L (ref 3.5–5.1)
Sodium: 137 mmol/L (ref 135–145)
Total Protein: 7.3 g/dL (ref 6.5–8.1)

## 2014-09-11 LAB — CBC WITH DIFFERENTIAL/PLATELET
BASOS PCT: 0 % (ref 0–1)
Basophils Absolute: 0 10*3/uL (ref 0.0–0.1)
EOS PCT: 7 % — AB (ref 0–5)
Eosinophils Absolute: 0.5 10*3/uL (ref 0.0–0.7)
HCT: 40.9 % (ref 39.0–52.0)
HEMOGLOBIN: 13.6 g/dL (ref 13.0–17.0)
Lymphocytes Relative: 39 % (ref 12–46)
Lymphs Abs: 3.2 10*3/uL (ref 0.7–4.0)
MCH: 31.6 pg (ref 26.0–34.0)
MCHC: 33.3 g/dL (ref 30.0–36.0)
MCV: 95.1 fL (ref 78.0–100.0)
Monocytes Absolute: 0.7 10*3/uL (ref 0.1–1.0)
Monocytes Relative: 8 % (ref 3–12)
NEUTROS ABS: 3.7 10*3/uL (ref 1.7–7.7)
NEUTROS PCT: 46 % (ref 43–77)
Platelets: 220 10*3/uL (ref 150–400)
RBC: 4.3 MIL/uL (ref 4.22–5.81)
RDW: 13.4 % (ref 11.5–15.5)
WBC: 8.1 10*3/uL (ref 4.0–10.5)

## 2014-09-11 LAB — URINALYSIS, ROUTINE W REFLEX MICROSCOPIC
Bilirubin Urine: NEGATIVE
Glucose, UA: NEGATIVE mg/dL
HGB URINE DIPSTICK: NEGATIVE
KETONES UR: NEGATIVE mg/dL
Leukocytes, UA: NEGATIVE
Nitrite: NEGATIVE
PH: 6 (ref 5.0–8.0)
Protein, ur: NEGATIVE mg/dL
Specific Gravity, Urine: 1.017 (ref 1.005–1.030)
UROBILINOGEN UA: 1 mg/dL (ref 0.0–1.0)

## 2014-09-11 LAB — APTT: APTT: 40 s — AB (ref 24–37)

## 2014-09-11 LAB — PROTIME-INR
INR: 1.24 (ref 0.00–1.49)
PROTHROMBIN TIME: 15.8 s — AB (ref 11.6–15.2)

## 2014-09-11 LAB — SURGICAL PCR SCREEN
MRSA, PCR: NEGATIVE
Staphylococcus aureus: NEGATIVE

## 2014-09-11 NOTE — Pre-Procedure Instructions (Signed)
    ADGER CANTERA  09/11/2014      [facility] - [place], [place] - [address] [place] [address] Phone: [phone] Fax: [place], [place] [address] [place] [address] Phone: [phone] Fax: [phone]    Your procedure is scheduled on 09/17/14.  Report to [facility] Admitting at 8 A.M.  Call this number if you have problems the morning of surgery:  [phone]   Remember:  Do not eat food or drink liquids after midnight.  Take these medicines the morning of surgery with A SIP OF WATER --synthroid,prilosec   Do not wear jewelry, make-up or nail polish.  Do not wear lotions, powders, or perfumes.  You may wear deodorant.  Do not shave 48 hours prior to surgery.  Men may shave face and neck.  Do not bring valuables to the hospital.  [facility] is not responsible for any belongings or valuables.  Contacts, dentures or bridgework may not be worn into surgery.  Leave your suitcase in the car.  After surgery it may be brought to your room.  For patients admitted to the hospital, discharge time will be determined by your treatment team.  Patients discharged the day of surgery will not be allowed to drive home.   Name and phone number of your driver:    Special instructions:   Please read over the following fact sheets that you were given. Pain Booklet, Coughing and Deep Breathing, MRSA Information and Surgical Site Infection Prevention

## 2014-09-12 DIAGNOSIS — M1712 Unilateral primary osteoarthritis, left knee: Secondary | ICD-10-CM | POA: Insufficient documentation

## 2014-09-13 LAB — URINE CULTURE: Culture: 4000

## 2014-09-14 MED ORDER — SODIUM CHLORIDE 0.9 % IV SOLN
1000.0000 mg | INTRAVENOUS | Status: AC
  Administered 2014-09-17: 1000 mg via INTRAVENOUS
  Filled 2014-09-14: qty 10

## 2014-09-14 MED ORDER — DEXTROSE 5 % IV SOLN
3.0000 g | INTRAVENOUS | Status: AC
  Administered 2014-09-17: 3 g via INTRAVENOUS
  Filled 2014-09-14: qty 3000

## 2014-09-14 MED ORDER — SODIUM CHLORIDE 0.9 % IV SOLN: INTRAVENOUS | Status: DC

## 2014-09-14 MED ORDER — BUPIVACAINE LIPOSOME 1.3 % IJ SUSP
20.0000 mL | INTRAMUSCULAR | Status: AC
  Administered 2014-09-17: 20 mL
  Filled 2014-09-14: qty 20

## 2014-09-17 ENCOUNTER — Inpatient Hospital Stay (HOSPITAL_COMMUNITY)
Admission: RE | Admit: 2014-09-17 | Discharge: 2014-09-18 | DRG: 470 | Disposition: A | Discharge status: Home Health | Payer: Commercial Managed Care - HMO | Source: Ambulatory Visit | Attending: Orthopedic Surgery | Admitting: Orthopedic Surgery

## 2014-09-17 ENCOUNTER — Inpatient Hospital Stay (HOSPITAL_COMMUNITY): Payer: Commercial Managed Care - HMO | Admitting: Anesthesiology

## 2014-09-17 ENCOUNTER — Encounter (HOSPITAL_COMMUNITY): Payer: Self-pay | Admitting: *Deleted

## 2014-09-17 ENCOUNTER — Encounter (HOSPITAL_COMMUNITY)
Admission: RE | Disposition: A | Discharge status: Home Health | Payer: Self-pay | Source: Ambulatory Visit | Attending: Orthopedic Surgery

## 2014-09-17 DIAGNOSIS — Z96651 Presence of right artificial knee joint: Secondary | ICD-10-CM | POA: Diagnosis not present

## 2014-09-17 DIAGNOSIS — I10 Essential (primary) hypertension: Secondary | ICD-10-CM | POA: Diagnosis not present

## 2014-09-17 DIAGNOSIS — M1712 Unilateral primary osteoarthritis, left knee: Secondary | ICD-10-CM | POA: Diagnosis not present

## 2014-09-17 DIAGNOSIS — M179 Osteoarthritis of knee, unspecified: Secondary | ICD-10-CM | POA: Diagnosis not present

## 2014-09-17 DIAGNOSIS — D62 Acute posthemorrhagic anemia: Secondary | ICD-10-CM | POA: Diagnosis not present

## 2014-09-17 DIAGNOSIS — Z96659 Presence of unspecified artificial knee joint: Secondary | ICD-10-CM

## 2014-09-17 DIAGNOSIS — E039 Hypothyroidism, unspecified: Secondary | ICD-10-CM | POA: Diagnosis not present

## 2014-09-17 DIAGNOSIS — G8918 Other acute postprocedural pain: Secondary | ICD-10-CM | POA: Diagnosis not present

## 2014-09-17 DIAGNOSIS — Z8249 Family history of ischemic heart disease and other diseases of the circulatory system: Secondary | ICD-10-CM

## 2014-09-17 DIAGNOSIS — E780 Pure hypercholesterolemia: Secondary | ICD-10-CM | POA: Diagnosis present

## 2014-09-17 HISTORY — PX: TOTAL KNEE ARTHROPLASTY: SHX125

## 2014-09-17 LAB — CBC
HEMATOCRIT: 39.8 % (ref 39.0–52.0)
Hemoglobin: 13.3 g/dL (ref 13.0–17.0)
MCH: 32 pg (ref 26.0–34.0)
MCHC: 33.4 g/dL (ref 30.0–36.0)
MCV: 95.9 fL (ref 78.0–100.0)
PLATELETS: 249 10*3/uL (ref 150–400)
RBC: 4.15 MIL/uL — ABNORMAL LOW (ref 4.22–5.81)
RDW: 13.3 % (ref 11.5–15.5)
WBC: 14.4 10*3/uL — ABNORMAL HIGH (ref 4.0–10.5)

## 2014-09-17 LAB — CREATININE, SERUM
Creatinine, Ser: 1.66 mg/dL — ABNORMAL HIGH (ref 0.61–1.24)
GFR calc Af Amer: 49 mL/min — ABNORMAL LOW (ref >60)
GFR calc non Af Amer: 43 mL/min — ABNORMAL LOW (ref >60)

## 2014-09-17 SURGERY — ARTHROPLASTY, KNEE, TOTAL
Anesthesia: Regional | Site: Knee | Laterality: Left

## 2014-09-17 MED ORDER — CELECOXIB 200 MG PO CAPS
200.0000 mg | ORAL_CAPSULE | Freq: Two times a day (BID) | ORAL | Status: DC
  Administered 2014-09-17 – 2014-09-18 (×2): 200 mg via ORAL
  Filled 2014-09-17 (×2): qty 1

## 2014-09-17 MED ORDER — PROPOFOL 10 MG/ML IV BOLUS
INTRAVENOUS | Status: DC | PRN
  Administered 2014-09-17: 200 mg via INTRAVENOUS

## 2014-09-17 MED ORDER — ONDANSETRON HCL 4 MG/2ML IJ SOLN: 4.0000 mg | Freq: Four times a day (QID) | INTRAMUSCULAR | Status: DC | PRN

## 2014-09-17 MED ORDER — CHLORHEXIDINE GLUCONATE 4 % EX LIQD: 60.0000 mL | Freq: Once | CUTANEOUS | Status: DC

## 2014-09-17 MED ORDER — METHOCARBAMOL 500 MG PO TABS
500.0000 mg | ORAL_TABLET | Freq: Four times a day (QID) | ORAL | Status: DC | PRN
  Administered 2014-09-17 (×2): 500 mg via ORAL
  Filled 2014-09-17 (×3): qty 1

## 2014-09-17 MED ORDER — BUPIVACAINE-EPINEPHRINE (PF) 0.5% -1:200000 IJ SOLN
INTRAMUSCULAR | Status: DC | PRN
  Administered 2014-09-17: 25 mL via PERINEURAL

## 2014-09-17 MED ORDER — FENTANYL CITRATE (PF) 100 MCG/2ML IJ SOLN
INTRAMUSCULAR | Status: AC
  Administered 2014-09-17: 100 ug
  Filled 2014-09-17: qty 2

## 2014-09-17 MED ORDER — ONDANSETRON HCL 4 MG PO TABS: 4.0000 mg | ORAL_TABLET | Freq: Four times a day (QID) | ORAL | Status: DC | PRN

## 2014-09-17 MED ORDER — HYDROMORPHONE HCL 1 MG/ML IJ SOLN
INTRAMUSCULAR | Status: AC
  Filled 2014-09-17: qty 1

## 2014-09-17 MED ORDER — FENTANYL CITRATE (PF) 100 MCG/2ML IJ SOLN
INTRAMUSCULAR | Status: DC | PRN
  Administered 2014-09-17 (×2): 100 ug via INTRAVENOUS

## 2014-09-17 MED ORDER — METOCLOPRAMIDE HCL 5 MG/ML IJ SOLN: 5.0000 mg | Freq: Three times a day (TID) | INTRAMUSCULAR | Status: DC | PRN

## 2014-09-17 MED ORDER — METHOCARBAMOL 1000 MG/10ML IJ SOLN
500.0000 mg | Freq: Four times a day (QID) | INTRAVENOUS | Status: DC | PRN
  Filled 2014-09-17: qty 5

## 2014-09-17 MED ORDER — PHENOL 1.4 % MT LIQD: 1.0000 | OROMUCOSAL | Status: DC | PRN

## 2014-09-17 MED ORDER — 0.9 % SODIUM CHLORIDE (POUR BTL) OPTIME
TOPICAL | Status: DC | PRN
  Administered 2014-09-17: 1000 mL

## 2014-09-17 MED ORDER — TRANEXAMIC ACID 1000 MG/10ML IV SOLN: 1000.0000 mg | Freq: Once | INTRAVENOUS | Status: DC

## 2014-09-17 MED ORDER — FENTANYL CITRATE (PF) 250 MCG/5ML IJ SOLN
INTRAMUSCULAR | Status: AC
  Filled 2014-09-17: qty 5

## 2014-09-17 MED ORDER — BISOPROLOL-HYDROCHLOROTHIAZIDE 10-6.25 MG PO TABS
1.0000 | ORAL_TABLET | Freq: Every day | ORAL | Status: DC
  Administered 2014-09-18: 1 via ORAL
  Filled 2014-09-17: qty 1

## 2014-09-17 MED ORDER — ONDANSETRON HCL 4 MG/2ML IJ SOLN
INTRAMUSCULAR | Status: DC | PRN
  Administered 2014-09-17: 4 mg via INTRAVENOUS

## 2014-09-17 MED ORDER — MIDAZOLAM HCL 2 MG/2ML IJ SOLN
INTRAMUSCULAR | Status: AC
  Filled 2014-09-17: qty 2

## 2014-09-17 MED ORDER — DIPHENHYDRAMINE HCL 12.5 MG/5ML PO ELIX: 12.5000 mg | ORAL_SOLUTION | ORAL | Status: DC | PRN

## 2014-09-17 MED ORDER — LACTATED RINGERS IV SOLN
INTRAVENOUS | Status: DC
  Administered 2014-09-17: 08:00:00 via INTRAVENOUS

## 2014-09-17 MED ORDER — MENTHOL 3 MG MT LOZG: 1.0000 | LOZENGE | OROMUCOSAL | Status: DC | PRN

## 2014-09-17 MED ORDER — HYDROMORPHONE HCL 1 MG/ML IJ SOLN
1.0000 mg | INTRAMUSCULAR | Status: DC | PRN
  Administered 2014-09-17 – 2014-09-18 (×3): 1 mg via INTRAVENOUS
  Filled 2014-09-17 (×3): qty 1

## 2014-09-17 MED ORDER — ENOXAPARIN SODIUM 30 MG/0.3ML ~~LOC~~ SOLN
30.0000 mg | Freq: Two times a day (BID) | SUBCUTANEOUS | Status: DC
  Administered 2014-09-18: 30 mg via SUBCUTANEOUS
  Filled 2014-09-17: qty 0.3

## 2014-09-17 MED ORDER — OXYCODONE HCL 5 MG PO TABS
ORAL_TABLET | ORAL | Status: AC
  Filled 2014-09-17: qty 1

## 2014-09-17 MED ORDER — PANTOPRAZOLE SODIUM 40 MG PO TBEC
80.0000 mg | DELAYED_RELEASE_TABLET | Freq: Every day | ORAL | Status: DC
  Administered 2014-09-18: 80 mg via ORAL
  Filled 2014-09-17: qty 2

## 2014-09-17 MED ORDER — LIDOCAINE HCL (CARDIAC) 20 MG/ML IV SOLN
INTRAVENOUS | Status: DC | PRN
  Administered 2014-09-17: 80 mg via INTRAVENOUS

## 2014-09-17 MED ORDER — OXYCODONE HCL ER 10 MG PO T12A
10.0000 mg | EXTENDED_RELEASE_TABLET | Freq: Two times a day (BID) | ORAL | Status: DC
  Administered 2014-09-17 – 2014-09-18 (×2): 10 mg via ORAL
  Filled 2014-09-17 (×2): qty 1

## 2014-09-17 MED ORDER — LORATADINE 10 MG PO TABS
10.0000 mg | ORAL_TABLET | Freq: Every day | ORAL | Status: DC
  Administered 2014-09-18: 10 mg via ORAL
  Filled 2014-09-17: qty 1

## 2014-09-17 MED ORDER — BISACODYL 5 MG PO TBEC: 5.0000 mg | DELAYED_RELEASE_TABLET | Freq: Every day | ORAL | Status: DC | PRN

## 2014-09-17 MED ORDER — LACTATED RINGERS IV SOLN
INTRAVENOUS | Status: DC | PRN
  Administered 2014-09-17 (×2): via INTRAVENOUS

## 2014-09-17 MED ORDER — OXYCODONE HCL 5 MG/5ML PO SOLN: 5.0000 mg | Freq: Once | ORAL | Status: AC | PRN

## 2014-09-17 MED ORDER — SODIUM CHLORIDE 0.9 % IR SOLN
Status: DC | PRN
  Administered 2014-09-17: 1000 mL

## 2014-09-17 MED ORDER — POTASSIUM GLUCONATE 595 (99 K) MG PO TABS: 1190.0000 mg | ORAL_TABLET | Freq: Every day | ORAL | Status: DC

## 2014-09-17 MED ORDER — SODIUM CHLORIDE 0.9 % IV SOLN
INTRAVENOUS | Status: DC
  Administered 2014-09-17 – 2014-09-18 (×2): via INTRAVENOUS

## 2014-09-17 MED ORDER — DOCUSATE SODIUM 100 MG PO CAPS
100.0000 mg | ORAL_CAPSULE | Freq: Two times a day (BID) | ORAL | Status: DC
  Administered 2014-09-17 – 2014-09-18 (×2): 100 mg via ORAL
  Filled 2014-09-17 (×2): qty 1

## 2014-09-17 MED ORDER — MIDAZOLAM HCL 2 MG/2ML IJ SOLN
INTRAMUSCULAR | Status: AC
  Administered 2014-09-17: 2 mg
  Filled 2014-09-17: qty 2

## 2014-09-17 MED ORDER — LIDOCAINE HCL (CARDIAC) 20 MG/ML IV SOLN
INTRAVENOUS | Status: AC
  Filled 2014-09-17: qty 5

## 2014-09-17 MED ORDER — PHENYLEPHRINE HCL 10 MG/ML IJ SOLN
INTRAMUSCULAR | Status: DC | PRN
  Administered 2014-09-17 (×2): 80 ug via INTRAVENOUS

## 2014-09-17 MED ORDER — SENNOSIDES-DOCUSATE SODIUM 8.6-50 MG PO TABS: 1.0000 | ORAL_TABLET | Freq: Every evening | ORAL | Status: DC | PRN

## 2014-09-17 MED ORDER — FLEET ENEMA 7-19 GM/118ML RE ENEM: 1.0000 | ENEMA | Freq: Once | RECTAL | Status: AC | PRN

## 2014-09-17 MED ORDER — BUPIVACAINE-EPINEPHRINE 0.5% -1:200000 IJ SOLN
INTRAMUSCULAR | Status: DC | PRN
  Administered 2014-09-17: 20 mL

## 2014-09-17 MED ORDER — HYDROCHLOROTHIAZIDE 25 MG PO TABS
12.5000 mg | ORAL_TABLET | Freq: Every day | ORAL | Status: DC
  Administered 2014-09-17 – 2014-09-18 (×2): 12.5 mg via ORAL
  Filled 2014-09-17 (×2): qty 1

## 2014-09-17 MED ORDER — CEFAZOLIN SODIUM 1-5 GM-% IV SOLN
1.0000 g | Freq: Four times a day (QID) | INTRAVENOUS | Status: AC
  Administered 2014-09-17 (×2): 1 g via INTRAVENOUS
  Filled 2014-09-17 (×2): qty 50

## 2014-09-17 MED ORDER — SUCCINYLCHOLINE CHLORIDE 20 MG/ML IJ SOLN
INTRAMUSCULAR | Status: DC | PRN
  Administered 2014-09-17: 140 mg via INTRAVENOUS

## 2014-09-17 MED ORDER — ZOLPIDEM TARTRATE 5 MG PO TABS: 5.0000 mg | ORAL_TABLET | Freq: Every evening | ORAL | Status: DC | PRN

## 2014-09-17 MED ORDER — SUCCINYLCHOLINE CHLORIDE 20 MG/ML IJ SOLN
INTRAMUSCULAR | Status: AC
  Filled 2014-09-17: qty 1

## 2014-09-17 MED ORDER — ONDANSETRON HCL 4 MG/2ML IJ SOLN
INTRAMUSCULAR | Status: AC
  Filled 2014-09-17: qty 2

## 2014-09-17 MED ORDER — HYDROMORPHONE HCL 1 MG/ML IJ SOLN
0.2500 mg | INTRAMUSCULAR | Status: DC | PRN
  Administered 2014-09-17 (×2): 0.5 mg via INTRAVENOUS

## 2014-09-17 MED ORDER — LEVOTHYROXINE SODIUM 175 MCG PO TABS
175.0000 ug | ORAL_TABLET | Freq: Every day | ORAL | Status: DC
  Administered 2014-09-18: 175 ug via ORAL
  Filled 2014-09-17 (×2): qty 1

## 2014-09-17 MED ORDER — METOCLOPRAMIDE HCL 5 MG PO TABS: 5.0000 mg | ORAL_TABLET | Freq: Three times a day (TID) | ORAL | Status: DC | PRN

## 2014-09-17 MED ORDER — PROPOFOL 10 MG/ML IV BOLUS
INTRAVENOUS | Status: AC
  Filled 2014-09-17: qty 20

## 2014-09-17 MED ORDER — ACETAMINOPHEN 650 MG RE SUPP: 650.0000 mg | Freq: Four times a day (QID) | RECTAL | Status: DC | PRN

## 2014-09-17 MED ORDER — OXYCODONE HCL 5 MG PO TABS
5.0000 mg | ORAL_TABLET | ORAL | Status: DC | PRN
Start: 2014-09-17 — End: 2014-09-18
  Administered 2014-09-17 – 2014-09-18 (×3): 10 mg via ORAL
  Filled 2014-09-17 (×3): qty 2

## 2014-09-17 MED ORDER — ACETAMINOPHEN 325 MG PO TABS: 650.0000 mg | ORAL_TABLET | Freq: Four times a day (QID) | ORAL | Status: DC | PRN

## 2014-09-17 MED ORDER — OXYCODONE HCL 5 MG PO TABS
5.0000 mg | ORAL_TABLET | Freq: Once | ORAL | Status: AC | PRN
  Administered 2014-09-17: 5 mg via ORAL

## 2014-09-17 MED ORDER — ALUM & MAG HYDROXIDE-SIMETH 200-200-20 MG/5ML PO SUSP: 30.0000 mL | ORAL | Status: DC | PRN

## 2014-09-17 MED ORDER — FENOFIBRATE 160 MG PO TABS
160.0000 mg | ORAL_TABLET | Freq: Every day | ORAL | Status: DC
  Administered 2014-09-17 – 2014-09-18 (×2): 160 mg via ORAL
  Filled 2014-09-17 (×2): qty 1

## 2014-09-17 SURGICAL SUPPLY — 59 items
BANDAGE ESMARK 6X9 LF (GAUZE/BANDAGES/DRESSINGS) ×1 IMPLANT
BLADE SAGITTAL 13X1.27X60 (BLADE) ×2 IMPLANT
BLADE SAGITTAL 13X1.27X60MM (BLADE) ×1
BLADE SAW SGTL 83.5X18.5 (BLADE) ×3 IMPLANT
BLADE SURG 10 STRL SS (BLADE) ×3 IMPLANT
BNDG ESMARK 6X9 LF (GAUZE/BANDAGES/DRESSINGS) ×3
BOWL SMART MIX CTS (DISPOSABLE) ×3 IMPLANT
CAPT KNEE TOTAL 3 ×3 IMPLANT
CEMENT BONE SIMPLEX SPEEDSET (Cement) ×3 IMPLANT
COVER SURGICAL LIGHT HANDLE (MISCELLANEOUS) ×3 IMPLANT
CUFF TOURNIQUET SINGLE 34IN LL (TOURNIQUET CUFF) ×3 IMPLANT
DRAPE EXTREMITY T 121X128X90 (DRAPE) ×3 IMPLANT
DRAPE IMP U-DRAPE 54X76 (DRAPES) ×3 IMPLANT
DRAPE INCISE IOBAN 66X45 STRL (DRAPES) ×6 IMPLANT
DRAPE PROXIMA HALF (DRAPES) IMPLANT
DRAPE U-SHAPE 47X51 STRL (DRAPES) ×3 IMPLANT
DRSG ADAPTIC 3X8 NADH LF (GAUZE/BANDAGES/DRESSINGS) ×3 IMPLANT
DRSG PAD ABDOMINAL 8X10 ST (GAUZE/BANDAGES/DRESSINGS) ×3 IMPLANT
DURAPREP 26ML APPLICATOR (WOUND CARE) ×6 IMPLANT
ELECT REM PT RETURN 9FT ADLT (ELECTROSURGICAL) ×3
ELECTRODE REM PT RTRN 9FT ADLT (ELECTROSURGICAL) ×1 IMPLANT
GAUZE SPONGE 4X4 12PLY STRL (GAUZE/BANDAGES/DRESSINGS) ×3 IMPLANT
GLOVE BIOGEL M 7.0 STRL (GLOVE) IMPLANT
GLOVE BIOGEL PI IND STRL 7.5 (GLOVE) IMPLANT
GLOVE BIOGEL PI IND STRL 8.5 (GLOVE) ×2 IMPLANT
GLOVE BIOGEL PI INDICATOR 7.5 (GLOVE)
GLOVE BIOGEL PI INDICATOR 8.5 (GLOVE) ×4
GLOVE SURG ORTHO 8.0 STRL STRW (GLOVE) ×9 IMPLANT
GOWN STRL REUS W/ TWL LRG LVL3 (GOWN DISPOSABLE) ×1 IMPLANT
GOWN STRL REUS W/ TWL XL LVL3 (GOWN DISPOSABLE) ×2 IMPLANT
GOWN STRL REUS W/TWL LRG LVL3 (GOWN DISPOSABLE) ×2
GOWN STRL REUS W/TWL XL LVL3 (GOWN DISPOSABLE) ×4
HANDPIECE INTERPULSE COAX TIP (DISPOSABLE) ×2
HOOD PEEL AWAY FACE SHEILD DIS (HOOD) ×9 IMPLANT
KIT BASIN OR (CUSTOM PROCEDURE TRAY) ×3 IMPLANT
KIT ROOM TURNOVER OR (KITS) ×3 IMPLANT
KNEE CAPITATED TOTAL 3 ×1 IMPLANT
MANIFOLD NEPTUNE II (INSTRUMENTS) ×3 IMPLANT
NEEDLE 22X1 1/2 (OR ONLY) (NEEDLE) ×6 IMPLANT
NS IRRIG 1000ML POUR BTL (IV SOLUTION) ×3 IMPLANT
PACK TOTAL JOINT (CUSTOM PROCEDURE TRAY) ×3 IMPLANT
PACK UNIVERSAL I (CUSTOM PROCEDURE TRAY) ×3 IMPLANT
PAD ARMBOARD 7.5X6 YLW CONV (MISCELLANEOUS) ×6 IMPLANT
PADDING CAST COTTON 6X4 STRL (CAST SUPPLIES) ×3 IMPLANT
SET HNDPC FAN SPRY TIP SCT (DISPOSABLE) ×1 IMPLANT
SPONGE GAUZE 4X4 12PLY STER LF (GAUZE/BANDAGES/DRESSINGS) ×3 IMPLANT
STAPLER VISISTAT 35W (STAPLE) ×3 IMPLANT
SUCTION FRAZIER TIP 10 FR DISP (SUCTIONS) ×3 IMPLANT
SUT BONE WAX W31G (SUTURE) ×3 IMPLANT
SUT VIC AB 0 CTB1 27 (SUTURE) ×6 IMPLANT
SUT VIC AB 1 CT1 27 (SUTURE) ×4
SUT VIC AB 1 CT1 27XBRD ANBCTR (SUTURE) ×2 IMPLANT
SUT VIC AB 2-0 CT1 27 (SUTURE) ×4
SUT VIC AB 2-0 CT1 TAPERPNT 27 (SUTURE) ×2 IMPLANT
SYR 20CC LL (SYRINGE) ×6 IMPLANT
TOWEL OR 17X24 6PK STRL BLUE (TOWEL DISPOSABLE) ×3 IMPLANT
TOWEL OR 17X26 10 PK STRL BLUE (TOWEL DISPOSABLE) ×3 IMPLANT
TRAY CATH 16FR W/PLASTIC CATH (SET/KITS/TRAYS/PACK) IMPLANT
WATER STERILE IRR 1000ML POUR (IV SOLUTION) IMPLANT

## 2014-09-17 NOTE — Transfer of Care (Signed)
Immediate Anesthesia Transfer of Care Note  Patient: Harold Nicholson  Procedure(s) Performed: Procedure(s): TOTAL KNEE ARTHROPLASTY (Left)  Patient Location: PACU  Anesthesia Type:General and GA combined with regional for post-op pain  Level of Consciousness: awake, alert , oriented and patient cooperative  Airway & Oxygen Therapy: Patient Spontanous Breathing and Patient connected to nasal cannula oxygen  Post-op Assessment: Report given to RN, Post -op Vital signs reviewed and stable and Patient moving all extremities  Post vital signs: Reviewed and stable  Last Vitals:  Filed Vitals:   09/17/14 0930  BP: 151/74  Pulse: 55  Temp:   Resp: 20    Complications: No apparent anesthesia complications

## 2014-09-17 NOTE — Evaluation (Signed)
Physical Therapy Evaluation Patient Details Name: Harold Nicholson MRN: [id number] DOB: Oct 14, 1950 Today's Date: 09/17/2014   History of Present Illness  Lt TKA  Clinical Impression  Pt is s/p TKA resulting in the deficits listed below (see PT Problem List). Pt will benefit from skilled PT to increase their independence and safety with mobility to allow discharge to home with family assistance. Patient mobilizing well with initial session, anticipate rapid progress.       Follow Up Recommendations Home health PT    Equipment Recommendations  Rolling walker with 5" wheels    Recommendations for Other Services       Precautions / Restrictions Precautions Precautions: Knee Restrictions Weight Bearing Restrictions: Yes LLE Weight Bearing: Weight bearing as tolerated      Mobility  Bed Mobility                  Transfers Overall transfer level: Needs assistance Equipment used: Rolling walker (2 wheeled) Transfers: Sit to/from Stand Sit to Stand: Min assist         General transfer comment: from bed side commode  Ambulation/Gait Ambulation/Gait assistance: Min assist Ambulation Distance (Feet): 10 Feet Assistive device: Rolling walker (2 wheeled) Gait Pattern/deviations: Step-to pattern;Decreased step length - left;Decreased weight shift to left     General Gait Details: slow pattern  Stairs            Wheelchair Mobility    Modified Rankin (Stroke Patients Only)       Balance Overall balance assessment: Needs assistance Sitting-balance support: No upper extremity supported Sitting balance-Leahy Scale: Good     Standing balance support: Bilateral upper extremity supported Standing balance-Leahy Scale: Poor Standing balance comment: using rw for standing                             Pertinent Vitals/Pain Pain Assessment: 0-10 Pain Score: 10-Worst pain ever Pain Location: Lt calf Pain Descriptors / Indicators: Spasm Pain  Intervention(s): Monitored during session;RN gave pain meds during session    Home Living Family/patient expects to be discharged to:: Private residence Living Arrangements: Parent Available Help at Discharge: Family Type of Home: Mobile home Home Access: Stairs to enter Entrance Stairs-Rails: Right Entrance Stairs-Number of Steps: 5 Home Layout: One level Home Equipment: Crutches      Prior Function Level of Independence: Independent               Hand Dominance        Extremity/Trunk Assessment               Lower Extremity Assessment: Overall WFL for tasks assessed;LLE deficits/detail   LLE Deficits / Details: difficulty with SLR      Communication   Communication: No difficulties  Cognition Arousal/Alertness: Awake/alert Behavior During Therapy: WFL for tasks assessed/performed Overall Cognitive Status: Within Functional Limits for tasks assessed                      General Comments      Exercises        Assessment/Plan    PT Assessment Patient needs continued PT services  PT Diagnosis Difficulty walking   PT Problem List Decreased strength;Decreased range of motion;Decreased activity tolerance;Decreased balance;Decreased mobility  PT Treatment Interventions DME instruction;Gait training;Stair training;Functional mobility training;Therapeutic activities;Therapeutic exercise;Balance training;Patient/family education   PT Goals (Current goals can be found in the Care Plan section) Acute Rehab PT Goals Patient Stated Goal: Be  able to move around again PT Goal Formulation: With patient Time For Goal Achievement: 10/01/14 Potential to Achieve Goals: Good    Frequency 7X/week   Barriers to discharge        Co-evaluation               End of Session Equipment Utilized During Treatment: Gait belt Activity Tolerance: Patient tolerated treatment well Patient left: in chair;with call bell/phone within reach;with family/visitor  present (with nursing following PT session) Nurse Communication: Mobility status         Time: 5681-2751 PT Time Calculation (min) (ACUTE ONLY): 22 min   Charges:   PT Evaluation $Initial PT Evaluation Tier I: 1 Procedure PT Treatments $Gait Training: 8-22 mins   PT G Codes:        Cassell Clement, PT, CSCS Pager [phone] Office [phone]  09/17/2014, 3:58 PM

## 2014-09-17 NOTE — H&P (Signed)
Harold Nicholson MRN:  [id number] DOB/SEX:  [birth year]/male  CHIEF COMPLAINT:  Painful left Knee  HISTORY: Patient is a 64 y.o. male presented with a history of pain in the left knee. Onset of symptoms was gradual starting several years ago with gradually worsening course since that time. Prior procedures on the knee include none. Patient has been treated conservatively with over-the-counter NSAIDs and activity modification. Patient currently rates pain in the knee at 10 out of 10 with activity. There is pain at night.  PAST MEDICAL HISTORY: Patient Active Problem List   Diagnosis Date Noted  . Leg pain 10/14/2011   Past Medical History  Diagnosis Date  . Hypertension   . Hypercholesteremia   . Hiatal hernia   . Pure hyperglyceridemia   . Thyroid disease     Hypothyroid   . Impaired fasting glucose   . Arthritis   . Hypothyroidism   . Complication of anesthesia     pt stated he stopped breathing during surgery   Past Surgical History  Procedure Laterality Date  . Right knee      X 2  . Left knee arthroscopy    . Colonoscopy  10/07/2010    Procedure: COLONOSCOPY;  Surgeon: Jamesetta So;  [facility]: [facility];  Service: Gastroenterology;  Laterality: N/A;  . Esophagogastroduodenoscopy  10/07/2010    Procedure: ESOPHAGOGASTRODUODENOSCOPY (EGD);  Surgeon: Jamesetta So;  Location: [facility];  Service: Gastroenterology;  Laterality: N/A;  . Joint replacement      Right knee  . Cholecystectomy    . Cholecystectomy N/A 05/01/2013    Procedure: LAPAROSCOPIC CHOLECYSTECTOMY;  Surgeon: Jamesetta So, MD;  Location: [facility];  Service: General;  Laterality: N/A;  . Liver biopsy N/A 05/01/2013    Procedure: LIVER BIOPSY;  Surgeon: Jamesetta So, MD;  Location: [facility];  Service: General;  Laterality: N/A;  . Eye surgery       MEDICATIONS:   No prescriptions prior to admission    ALLERGIES:  No Known Allergies  REVIEW OF SYSTEMS:  A comprehensive review of systems was  negative.   FAMILY HISTORY:   Family History  Problem Relation Age of Onset  . Diabetes Mother   . Hyperlipidemia Mother   . Hypertension Mother   . Hyperlipidemia Father   . Hypertension Father   . Diabetes Sister   . Hearing loss Sister   . Hyperlipidemia Sister   . Hypertension Sister   . Diabetes Brother   . Hyperlipidemia Brother   . Hypertension Brother     SOCIAL HISTORY:   History  Substance Use Topics  . Smoking status: Never Smoker   . Smokeless tobacco: Current User    Types: Snuff  . Alcohol Use: No     Comment: quit drinking 3 years ago     EXAMINATION:  Vital signs in last 24 hours:    General appearance: alert, cooperative and no distress Lungs: clear to auscultation bilaterally Heart: regular rate and rhythm, S1, S2 normal, no murmur, click, rub or gallop Abdomen: soft, non-tender; bowel sounds normal; no masses,  no organomegaly Extremities: extremities normal, atraumatic, no cyanosis or edema and Homans sign is negative, no sign of DVT Pulses: 2+ and symmetric Skin: Skin color, texture, turgor normal. No rashes or lesions Neurologic: Alert and oriented X 3, normal strength and tone. Normal symmetric reflexes. Normal coordination and gait  Musculoskeletal:  ROM 0-105, Ligaments intact,  Imaging Review Plain radiographs demonstrate severe degenerative joint disease of the  left knee. The overall alignment is significant varus. The bone quality appears to be good for age and reported activity level.  Assessment/Plan: Primary osteoarthritis, left knee   The patient history, physical examination and imaging studies are consistent with advanced degenerative joint disease of the left knee. The patient has failed conservative treatment.  The clearance notes were reviewed.  After discussion with the patient it was felt that Total Knee Replacement was indicated. The procedure,  risks, and benefits of total knee arthroplasty were presented and reviewed. The  risks including but not limited to aseptic loosening, infection, blood clots, vascular injury, stiffness, patella tracking problems complications among others were discussed. The patient acknowledged the explanation, agreed to proceed with the plan.  Dorthy Hustead 09/17/2014, 6:41 AM

## 2014-09-17 NOTE — Progress Notes (Signed)
Orthopedic Tech Progress Note Patient Details:  Harold Nicholson Dec 01, 1950 [id number] Viewed order from doctor's order list CPM Left Knee CPM Left Knee: On Left Knee Flexion (Degrees): 90 Left Knee Extension (Degrees): 0 Additional Comments: trapeze bar patient helper   [name] 09/17/2014, 12:42 PM

## 2014-09-17 NOTE — Anesthesia Procedure Notes (Addendum)
Anesthesia Regional Block:  Adductor canal block  Pre-Anesthetic Checklist: ,, timeout performed, Correct Patient, Correct Site, Correct Laterality, Correct Procedure, Correct Position, site marked, Risks and benefits discussed,  Surgical consent,  Pre-op evaluation,  At surgeon's request and post-op pain management  Laterality: Left  Prep: chloraprep       Needles:  Injection technique: Single-shot  Needle Type: Echogenic Needle     Needle Length: 9cm 9 cm Needle Gauge: 21 and 21 G    Additional Needles:  Procedures: ultrasound guided (picture in chart) Adductor canal block Narrative:  Start time: 09/17/2014 9:25 AM End time: 09/17/2014 9:34 AM Injection made incrementally with aspirations every 5 mL.  Performed by: Personally  Anesthesiologist: HODIERNE, ADAM  Additional Notes: Pt tolerated the procedure well.   Procedure Name: Intubation Date/Time: 09/17/2014 10:15 AM Performed by: Greggory Stallion, Iyan Flett L Pre-anesthesia Checklist: Patient identified, Emergency Drugs available, Suction available, Patient being monitored and Timeout performed Patient Re-evaluated:Patient Re-evaluated prior to inductionOxygen Delivery Method: Circle system utilized Preoxygenation: Pre-oxygenation with 100% oxygen Intubation Type: IV induction Ventilation: Mask ventilation without difficulty Laryngoscope Size: Mac and 4 Grade View: Grade III Tube type: Oral Tube size: 8.0 mm Number of attempts: 1 Airway Equipment and Method: Stylet Placement Confirmation: ETT inserted through vocal cords under direct vision,  breath sounds checked- equal and bilateral and positive ETCO2 Secured at: 23 cm Tube secured with: Tape Dental Injury: Teeth and Oropharynx as per pre-operative assessment

## 2014-09-17 NOTE — Progress Notes (Signed)
Utilization review completed.  

## 2014-09-17 NOTE — Anesthesia Preprocedure Evaluation (Addendum)
Anesthesia Evaluation  Patient identified by MRN, date of birth, ID band Patient awake    Reviewed: Allergy & Precautions, NPO status , Patient's Chart, lab work & pertinent test results  Airway Mallampati: II   Neck ROM: full    Dental   Pulmonary neg pulmonary ROS,  breath sounds clear to auscultation        Cardiovascular hypertension, Rhythm:regular Rate:Normal     Neuro/Psych  Neuromuscular disease    GI/Hepatic hiatal hernia,   Endo/Other  Hypothyroidism obese  Renal/GU      Musculoskeletal  (+) Arthritis -,   Abdominal   Peds  Hematology   Anesthesia Other Findings   Reproductive/Obstetrics                            Anesthesia Physical Anesthesia Plan  ASA: II  Anesthesia Plan: General and Regional   Post-op Pain Management:    Induction: Intravenous  Airway Management Planned: Oral ETT  Additional Equipment:   Intra-op Plan:   Post-operative Plan: Extubation in OR  Informed Consent: I have reviewed the patients History and Physical, chart, labs and discussed the procedure including the risks, benefits and alternatives for the proposed anesthesia with the patient or authorized representative who has indicated his/her understanding and acceptance.     Plan Discussed with: CRNA, Anesthesiologist and Surgeon  Anesthesia Plan Comments:        Anesthesia Quick Evaluation

## 2014-09-18 ENCOUNTER — Encounter (HOSPITAL_COMMUNITY): Payer: Self-pay | Admitting: Orthopedic Surgery

## 2014-09-18 LAB — CBC
HEMATOCRIT: 33 % — AB (ref 39.0–52.0)
HEMOGLOBIN: 10.9 g/dL — AB (ref 13.0–17.0)
MCH: 31 pg (ref 26.0–34.0)
MCHC: 33 g/dL (ref 30.0–36.0)
MCV: 93.8 fL (ref 78.0–100.0)
Platelets: 238 10*3/uL (ref 150–400)
RBC: 3.52 MIL/uL — ABNORMAL LOW (ref 4.22–5.81)
RDW: 13.3 % (ref 11.5–15.5)
WBC: 13.4 10*3/uL — AB (ref 4.0–10.5)

## 2014-09-18 LAB — BASIC METABOLIC PANEL
ANION GAP: 7 (ref 5–15)
BUN: 14 mg/dL (ref 6–20)
CALCIUM: 8.7 mg/dL — AB (ref 8.9–10.3)
CO2: 25 mmol/L (ref 22–32)
CREATININE: 1.51 mg/dL — AB (ref 0.61–1.24)
Chloride: 98 mmol/L — ABNORMAL LOW (ref 101–111)
GFR calc Af Amer: 55 mL/min — ABNORMAL LOW (ref >60)
GFR calc non Af Amer: 48 mL/min — ABNORMAL LOW (ref >60)
Glucose, Bld: 121 mg/dL — ABNORMAL HIGH (ref 65–99)
POTASSIUM: 3.7 mmol/L (ref 3.5–5.1)
SODIUM: 130 mmol/L — AB (ref 135–145)

## 2014-09-18 MED ORDER — ENOXAPARIN SODIUM 40 MG/0.4ML ~~LOC~~ SOLN: 40.0000 mg | SUBCUTANEOUS | Status: DC

## 2014-09-18 MED ORDER — TIZANIDINE HCL 4 MG PO TABS: 4.0000 mg | ORAL_TABLET | Freq: Four times a day (QID) | ORAL | Status: DC | PRN

## 2014-09-18 MED ORDER — OXYCODONE HCL 10 MG PO TABS: 10.0000 mg | ORAL_TABLET | Freq: Two times a day (BID) | ORAL | Status: DC

## 2014-09-18 MED ORDER — OXYCODONE HCL 5 MG PO TABS: 5.0000 mg | ORAL_TABLET | ORAL | Status: DC | PRN

## 2014-09-18 NOTE — Progress Notes (Signed)
SPORTS MEDICINE AND JOINT REPLACEMENT  Lara Mulch, MD   Carlynn Spry, PA-C [address], [place], [place]  [address]                             [phone]   PROGRESS NOTE  Subjective:  negative for Chest Pain  negative for Shortness of Breath  negative for Nausea/Vomiting   negative for Calf Pain  negative for Bowel Movement   Tolerating Diet: yes         Patient reports pain as 6 on 0-10 scale.    Objective: Vital signs in last 24 hours:   Patient Vitals for the past 24 hrs:  BP Temp Temp src Pulse Resp SpO2  09/18/14 0200 (!) 157/85 mmHg 99.3 F (37.4 C) Oral 95 18 94 %  09/17/14 2041 (!) 155/98 mmHg (!) 100.4 F (38 C) Oral 76 19 98 %    @flow {1959:LAST@   Intake/Output from previous day:   08/01 0701 - 08/02 0700 In: 8938 [P.O.:480; I.V.:2275] Out: 1200 [Urine:1200]   Intake/Output this shift:       Intake/Output      08/01 0701 - 08/02 0700 08/02 0701 - 08/03 0700   P.O. 480    I.V. (mL/kg) 2275 (18.4)    Total Intake(mL/kg) 2755 (22.2)    Urine (mL/kg/hr) 1200    Total Output 1200     Net +1555             LABORATORY DATA:  Recent Labs  09/11/14 1534 09/17/14 1504 09/18/14 0412  WBC 8.1 14.4* 13.4*  HGB 13.6 13.3 10.9*  HCT 40.9 39.8 33.0*  PLT 220 249 238    Recent Labs  09/11/14 1534 09/17/14 1504 09/18/14 0412  NA 137  --  130*  K 4.2  --  3.7  CL 107  --  98*  CO2 22  --  25  BUN 13  --  14  CREATININE 1.53* 1.66* 1.51*  GLUCOSE 106*  --  121*  CALCIUM 9.2  --  8.7*   Lab Results  Component Value Date   INR 1.24 09/11/2014    Examination:  General appearance: alert, cooperative and no distress Extremities: Homans sign is negative, no sign of DVT  Wound Exam: clean, dry, intact   Drainage:  None: wound tissue dry  Motor Exam: EHL and FHL Intact  Sensory Exam: Deep Peroneal normal   Assessment:    1 Day Post-Op  Procedure(s) (LRB): TOTAL KNEE ARTHROPLASTY (Left)  ADDITIONAL DIAGNOSIS:  Active  Problems:   S/P total knee arthroplasty  Acute Blood Loss Anemia   Plan: Physical Therapy as ordered Weight Bearing as Tolerated (WBAT)  DVT Prophylaxis:  Lovenox  DISCHARGE PLAN: Home  DISCHARGE NEEDS: HHPT, CPM, Walker and 3-in-1 comode seat         Harold Nicholson 09/18/2014, 2:44 PM

## 2014-09-18 NOTE — Progress Notes (Signed)
Occupational Therapy Evaluation Patient Details Name: Harold Nicholson MRN: [id number] DOB: 1951/01/19 Today's Date: 09/18/2014    History of Present Illness Lt TKA   Clinical Impression   Patient presenting with decreased ADL, IADL, functional mobility independence secondary to above. Patient independent PTA. Patient currently functioning at an overall min assist level. Patient will benefit from acute OT to increase overall independence in the areas of ADLs, functional mobility, and overall safety in order to safely discharge home with wife.     Follow Up Recommendations  No OT follow up;Supervision - Intermittent    Equipment Recommendations  3 in 1 bedside comode    Recommendations for Other Services  None at this time   Precautions / Restrictions Precautions Precautions: Knee Precaution Comments: Reviewed no pillow under knee and zero knee foam Restrictions Weight Bearing Restrictions: Yes LLE Weight Bearing: Weight bearing as tolerated    Mobility Bed Mobility Overal bed mobility: Needs Assistance Bed Mobility: Supine to Sit     Supine to sit: Min assist     General bed mobility comments: Increase time. Min A getting LLE off of bed. HOB elevated.  Transfers Overall transfer level: Needs assistance Equipment used: Rolling walker (2 wheeled) Transfers: Sit to/from Stand Sit to Stand: Min guard General transfer comment: Cues for hand placement. Min guard for safety.    Balance Overall balance assessment: Needs assistance Sitting-balance support: No upper extremity supported;Feet supported Sitting balance-Leahy Scale: Good     Standing balance support: Bilateral upper extremity supported;During functional activity Standing balance-Leahy Scale: Fair    ADL Overall ADL's : Needs assistance/impaired General ADL Comments: Pt requires assistance with LB ADLs secondary to recent left TKA. Pt min guard/min assist with functional ambulation/mobility. Pt ambulated <> BR  for toilet transfer on/off BSC. Wife present towards end of session and can provide the necessary assistance post acute d/c.     Pertinent Vitals/Pain Pain Assessment: No/denies pain     Hand Dominance Right   Extremity/Trunk Assessment Upper Extremity Assessment Upper Extremity Assessment: Overall WFL for tasks assessed   Lower Extremity Assessment Lower Extremity Assessment: Defer to PT evaluation   Cervical / Trunk Assessment Cervical / Trunk Assessment: Normal   Communication Communication Communication: No difficulties   Cognition Arousal/Alertness: Lethargic;Suspect due to medications Behavior During Therapy: [facility] for tasks assessed/performed Overall Cognitive Status: Within Functional Limits for tasks assessed              Home Living Family/patient expects to be discharged to:: Private residence Living Arrangements: Spouse/significant other Available Help at Discharge: Family Type of Home: Mobile home Home Access: Stairs to enter Entrance Stairs-Number of Steps: 5 Entrance Stairs-Rails: Right Home Layout: One level     Bathroom Shower/Tub: Corporate investment banker: Standard     Home Equipment: Crutches   Prior Functioning/Environment Level of Independence: Independent     OT Diagnosis: Generalized weakness;Acute pain   OT Problem List: Decreased strength;Decreased range of motion;Decreased activity tolerance;Impaired balance (sitting and/or standing);Decreased safety awareness;Decreased knowledge of use of DME or AE;Pain   OT Treatment/Interventions: Self-care/ADL training;Energy conservation;DME and/or AE instruction;Therapeutic activities;Patient/family education;Balance training    OT Goals(Current goals can be found in the care plan section) Acute Rehab OT Goals Patient Stated Goal: go home soon OT Goal Formulation: With patient/family Time For Goal Achievement: 10/02/14 Potential to Achieve Goals: Good ADL Goals Pt Will  Perform Grooming: with modified independence;standing Pt Will Perform Lower Body Bathing: with modified independence;sit to/from stand Pt Will Perform Lower Body  Dressing: with modified independence;sit to/from stand Pt Will Transfer to Toilet: with modified independence;ambulating;bedside commode Pt Will Perform Tub/Shower Transfer: Tub transfer;tub bench;rolling walker;ambulating;with modified independence  OT Frequency: Min 2X/week   Barriers to D/C: None known at this time   End of Session Equipment Utilized During Treatment: Gait belt;Rolling walker CPM Left Knee CPM Left Knee: Off  Activity Tolerance: Patient tolerated treatment well Patient left: in chair;with call bell/phone within reach;with family/visitor present   Time: 7517-0017 OT Time Calculation (min): 26 min Charges:  OT General Charges $OT Visit: 1 Procedure OT Evaluation $Initial OT Evaluation Tier I: 1 Procedure OT Treatments $Self Care/Home Management : 8-22 mins  Welda Azzarello , MS, OTR/L, CLT Pager: [phone]  09/18/2014, 9:51 AM

## 2014-09-18 NOTE — Care Management Note (Signed)
Case Management Note  Patient Details  Name: Harold Nicholson MRN: [id number] Date of Birth: 1950/10/20  Subjective/Objective:          S/p left total knee arthroplasty          Action/Plan: Set up with [facility] by MD office but informed by Octavia Bruckner at [facility] that they are unable to work with patient's insurance.  Spoke with patient and his wife about home health, they chose [facility]. Contacted Miranda at [facility] and set up [facility]. [employer] will deliver rolling walker and 3N1 to patient's room and CPM to patient's home. Patient's wife will be available to assist patient after discharge.   Expected Discharge Date:                  Expected Discharge Plan:  [place]  In-House Referral:  NA  Discharge planning Services  CM Consult  Post Acute Care Choice:  Durable Medical Equipment, Home Health Choice offered to:  Patient  DME Arranged:  3-N-1, CPM, Walker rolling DME Agency:  [employer]  HH Arranged:  PT [place]:  [place]  Status of Service:  Completed, signed off  Medicare Important Message Given:    Date Medicare IM Given:    Medicare IM give by:    Date Additional Medicare IM Given:    Additional Medicare Important Message give by:     If discussed at [place] of Stay Meetings, dates discussed:    Additional Comments:  Nila Nephew, RN 09/18/2014, 11:26 AM

## 2014-09-18 NOTE — Progress Notes (Signed)
Physical Therapy Treatment Patient Details Name: Harold Nicholson MRN: [id number] DOB: Mar 18, 1950 Today's Date: 09/27/14    History of Present Illness Lt TKA    PT Comments    Pt progressing with ambulation. Able to ambulate stairs successfully. Pt stated that he is ready to go home. Continue to recommend HHPT for ongoing rehab to increase functional independence.  Follow Up Recommendations  Home health PT     Equipment Recommendations  Rolling walker with 5" wheels    Recommendations for Other Services       Precautions / Restrictions Precautions Precautions: Knee Precaution Comments: Reviewed no pillow under knee and zero knee foam Restrictions LLE Weight Bearing: Weight bearing as tolerated    Mobility  Bed Mobility Overal bed mobility: Needs Assistance Bed Mobility: Sit to Supine       Sit to supine: Min guard   General bed mobility comments: Increase time. Cues for technique and sequence. Min G for safety.  Transfers Overall transfer level: Needs assistance Equipment used: Rolling walker (2 wheeled) Transfers: Sit to/from Stand Sit to Stand: Min guard         General transfer comment: Cues for hand placement. Min guard for safety.  Ambulation/Gait Ambulation/Gait assistance: Min guard Ambulation Distance (Feet): 225 Feet Assistive device: Rolling walker (2 wheeled) Gait Pattern/deviations: Step-through pattern;Decreased stride length;Decreased step length - right;Decreased stance time - left     General Gait Details: Pt able to ambulate longer before needing rest break. Min G for safety as pt tends to take hands off of walker. Instructed pt on safety with RW.   Stairs Stairs: Yes Stairs assistance: Min guard Stair Management: Two rails;Forwards Number of Stairs: 5 General stair comments: Cues for hand placement, technique, and sequence. Min G for safety. Pt overall stair ambulation was good.  Wheelchair Mobility    Modified Rankin (Stroke  Patients Only)       Balance                                    Cognition Arousal/Alertness: Awake/alert Behavior During Therapy: WFL for tasks assessed/performed Overall Cognitive Status: Within Functional Limits for tasks assessed                      Exercises      General Comments        Pertinent Vitals/Pain Pain Assessment: No/denies pain    Home Living                      Prior Function            PT Goals (current goals can now be found in the care plan section) Progress towards PT goals: Progressing toward goals    Frequency  7X/week    PT Plan Current plan remains appropriate    Co-evaluation             End of Session Equipment Utilized During Treatment: Gait belt Activity Tolerance: Patient tolerated treatment well Patient left: in bed;with call bell/phone within reach     Time: 1402-1440 PT Time Calculation (min) (ACUTE ONLY): 38 min  Charges:  $Gait Training: 23-37 mins $Therapeutic Activity: 8-22 mins                    G CodesAllyn Kenner, SPTA 2014-09-27, 2:48 PM

## 2014-09-18 NOTE — Progress Notes (Signed)
Physical Therapy Treatment Patient Details Name: Harold Nicholson MRN: [id number] DOB: 1950/03/13 Today's Date: Sep 27, 2014    History of Present Illness Lt TKA    PT Comments    Pt motivated to progress with rehab. Pt fatigues quickly during ambulation and exercise. Pursed lip breathing noted and pt required frequent rest breaks. Pt strength is progressing but lacks endurance. Continue with current POC.  Follow Up Recommendations  Home health PT     Equipment Recommendations  Rolling walker with 5" wheels    Recommendations for Other Services       Precautions / Restrictions Precautions Precautions: Knee Restrictions LLE Weight Bearing: Weight bearing as tolerated    Mobility  Bed Mobility Overal bed mobility: Needs Assistance Bed Mobility: Supine to Sit     Supine to sit: Min assist     General bed mobility comments: Increase time. Min A getting LLE off of bed. HOB elevated.  Transfers Overall transfer level: Needs assistance Equipment used: Rolling walker (2 wheeled) Transfers: Sit to/from Stand Sit to Stand: Min guard         General transfer comment: Cues for hand placement. Min G for safety.  Ambulation/Gait Ambulation/Gait assistance: Min assist Ambulation Distance (Feet): 115 Feet Assistive device: Rolling walker (2 wheeled) Gait Pattern/deviations: Step-through pattern;Decreased stride length;Decreased step length - right;Decreased stance time - left;Decreased weight shift to left   Gait velocity interpretation: Below normal speed for age/gender General Gait Details: Slow gait. Needed frequent standing rest breaks as pt fatigues quickly. Cues for upright posture and forward head posture.   Stairs            Wheelchair Mobility    Modified Rankin (Stroke Patients Only)       Balance                                    Cognition Arousal/Alertness: Awake/alert Behavior During Therapy: WFL for tasks  assessed/performed Overall Cognitive Status: Within Functional Limits for tasks assessed                      Exercises Total Joint Exercises Quad Sets: Both;10 reps;Supine Heel Slides: AAROM;Left;10 reps;Supine Hip ABduction/ADduction: AAROM;Left;10 reps;Supine    General Comments        Pertinent Vitals/Pain Pain Assessment: No/denies pain    Home Living                      Prior Function            PT Goals (current goals can now be found in the care plan section) Progress towards PT goals: Progressing toward goals    Frequency  7X/week    PT Plan Current plan remains appropriate    Co-evaluation             End of Session Equipment Utilized During Treatment: Gait belt Activity Tolerance: Patient tolerated treatment well Patient left: in bed;with call bell/phone within reach     Time: 0800-0839 PT Time Calculation (min) (ACUTE ONLY): 39 min  Charges:                       G CodesAllyn Kenner, SPTA 2014/09/27, 8:47 AM

## 2014-09-18 NOTE — Progress Notes (Signed)
Orthopedic Tech Progress Note Patient Details:  Harold Nicholson [birth year] [id number]  Patient ID: Harold Nicholson, male   DOB: 05-Oct-1950, 64 y.o.   MRN: [id number] Placed pt's lle on cpm @ 0-50 degrees; will increase as pt tolerates; RN notified  [name] 09/18/2014, 2:48 PM

## 2014-09-19 DIAGNOSIS — Z96652 Presence of left artificial knee joint: Secondary | ICD-10-CM | POA: Diagnosis not present

## 2014-09-19 DIAGNOSIS — I10 Essential (primary) hypertension: Secondary | ICD-10-CM | POA: Diagnosis not present

## 2014-09-19 DIAGNOSIS — M1990 Unspecified osteoarthritis, unspecified site: Secondary | ICD-10-CM | POA: Diagnosis not present

## 2014-09-19 DIAGNOSIS — E780 Pure hypercholesterolemia: Secondary | ICD-10-CM | POA: Diagnosis not present

## 2014-09-19 DIAGNOSIS — Z471 Aftercare following joint replacement surgery: Secondary | ICD-10-CM | POA: Diagnosis not present

## 2014-09-19 DIAGNOSIS — K449 Diaphragmatic hernia without obstruction or gangrene: Secondary | ICD-10-CM | POA: Diagnosis not present

## 2014-09-20 DIAGNOSIS — I10 Essential (primary) hypertension: Secondary | ICD-10-CM | POA: Diagnosis not present

## 2014-09-20 DIAGNOSIS — E780 Pure hypercholesterolemia: Secondary | ICD-10-CM | POA: Diagnosis not present

## 2014-09-20 DIAGNOSIS — Z471 Aftercare following joint replacement surgery: Secondary | ICD-10-CM | POA: Diagnosis not present

## 2014-09-20 DIAGNOSIS — K449 Diaphragmatic hernia without obstruction or gangrene: Secondary | ICD-10-CM | POA: Diagnosis not present

## 2014-09-20 DIAGNOSIS — M1990 Unspecified osteoarthritis, unspecified site: Secondary | ICD-10-CM | POA: Diagnosis not present

## 2014-09-20 DIAGNOSIS — Z96652 Presence of left artificial knee joint: Secondary | ICD-10-CM | POA: Diagnosis not present

## 2014-09-21 DIAGNOSIS — M1990 Unspecified osteoarthritis, unspecified site: Secondary | ICD-10-CM | POA: Diagnosis not present

## 2014-09-21 DIAGNOSIS — I10 Essential (primary) hypertension: Secondary | ICD-10-CM | POA: Diagnosis not present

## 2014-09-21 DIAGNOSIS — Z471 Aftercare following joint replacement surgery: Secondary | ICD-10-CM | POA: Diagnosis not present

## 2014-09-21 DIAGNOSIS — E780 Pure hypercholesterolemia: Secondary | ICD-10-CM | POA: Diagnosis not present

## 2014-09-21 DIAGNOSIS — Z96652 Presence of left artificial knee joint: Secondary | ICD-10-CM | POA: Diagnosis not present

## 2014-09-21 DIAGNOSIS — K449 Diaphragmatic hernia without obstruction or gangrene: Secondary | ICD-10-CM | POA: Diagnosis not present

## 2014-09-21 NOTE — Anesthesia Postprocedure Evaluation (Signed)
Anesthesia Post Note  Patient: Harold Nicholson  Procedure(s) Performed: Procedure(s) (LRB): TOTAL KNEE ARTHROPLASTY (Left)  Anesthesia type: General  Patient location: PACU  Post pain: Pain level controlled and Adequate analgesia  Post assessment: Post-op Vital signs reviewed, Patient's Cardiovascular Status Stable, Respiratory Function Stable, Patent Airway and Pain level controlled  Last Vitals:  Filed Vitals:   09/18/14 0200  BP: 157/85  Pulse: 95  Temp: 37.4 C  Resp: 18    Post vital signs: Reviewed and stable  Level of consciousness: awake, alert  and oriented  Complications: No apparent anesthesia complications

## 2014-09-24 DIAGNOSIS — K449 Diaphragmatic hernia without obstruction or gangrene: Secondary | ICD-10-CM | POA: Diagnosis not present

## 2014-09-24 DIAGNOSIS — E780 Pure hypercholesterolemia: Secondary | ICD-10-CM | POA: Diagnosis not present

## 2014-09-24 DIAGNOSIS — Z96652 Presence of left artificial knee joint: Secondary | ICD-10-CM | POA: Diagnosis not present

## 2014-09-24 DIAGNOSIS — I10 Essential (primary) hypertension: Secondary | ICD-10-CM | POA: Diagnosis not present

## 2014-09-24 DIAGNOSIS — M1990 Unspecified osteoarthritis, unspecified site: Secondary | ICD-10-CM | POA: Diagnosis not present

## 2014-09-24 DIAGNOSIS — Z471 Aftercare following joint replacement surgery: Secondary | ICD-10-CM | POA: Diagnosis not present

## 2014-09-25 DIAGNOSIS — Z96652 Presence of left artificial knee joint: Secondary | ICD-10-CM | POA: Diagnosis not present

## 2014-09-25 DIAGNOSIS — M1990 Unspecified osteoarthritis, unspecified site: Secondary | ICD-10-CM | POA: Diagnosis not present

## 2014-09-25 DIAGNOSIS — E780 Pure hypercholesterolemia: Secondary | ICD-10-CM | POA: Diagnosis not present

## 2014-09-25 DIAGNOSIS — K449 Diaphragmatic hernia without obstruction or gangrene: Secondary | ICD-10-CM | POA: Diagnosis not present

## 2014-09-25 DIAGNOSIS — I10 Essential (primary) hypertension: Secondary | ICD-10-CM | POA: Diagnosis not present

## 2014-09-25 DIAGNOSIS — Z471 Aftercare following joint replacement surgery: Secondary | ICD-10-CM | POA: Diagnosis not present

## 2014-09-26 DIAGNOSIS — Z471 Aftercare following joint replacement surgery: Secondary | ICD-10-CM | POA: Diagnosis not present

## 2014-09-26 DIAGNOSIS — I10 Essential (primary) hypertension: Secondary | ICD-10-CM | POA: Diagnosis not present

## 2014-09-26 DIAGNOSIS — M1990 Unspecified osteoarthritis, unspecified site: Secondary | ICD-10-CM | POA: Diagnosis not present

## 2014-09-26 DIAGNOSIS — Z96652 Presence of left artificial knee joint: Secondary | ICD-10-CM | POA: Diagnosis not present

## 2014-09-26 DIAGNOSIS — K449 Diaphragmatic hernia without obstruction or gangrene: Secondary | ICD-10-CM | POA: Diagnosis not present

## 2014-09-26 DIAGNOSIS — E780 Pure hypercholesterolemia: Secondary | ICD-10-CM | POA: Diagnosis not present

## 2014-09-26 NOTE — Op Note (Signed)
TOTAL KNEE REPLACEMENT OPERATIVE NOTE:  09/17/2014  11:07 AM  PATIENT:  Harold Nicholson  64 y.o. male  PRE-OPERATIVE DIAGNOSIS:  PRIMARY OSTEOARTHRITIS LEFT KNEE  POST-OPERATIVE DIAGNOSIS:  PRIMARY OSTEOARTHRITIS LEFT KNEE  PROCEDURE:  Procedure(s): TOTAL KNEE ARTHROPLASTY  SURGEON:  Surgeon(s): Vickey Huger, MD  PHYSICIAN ASSISTANT: Carlynn Spry, [facility]  ANESTHESIA:   spinal  DRAINS: Hemovac  SPECIMEN: None  COUNTS:  Correct  TOURNIQUET:  * Missing tourniquet times found for documented tourniquets in log:  [id number] *  DICTATION:  Indication for procedure:    The patient is a 64 y.o. male who has failed conservative treatment for PRIMARY OSTEOARTHRITIS LEFT KNEE.  Informed consent was obtained prior to anesthesia. The risks versus benefits of the operation were explain and in a way the patient can, and did, understand.   On the implant demand matching protocol, this patient scored 10.  Therefore, this patient did" "did not receive a polyethylene insert with vitamin E which is a high demand implant.  Description of procedure:     The patient was taken to the operating room and placed under anesthesia.  The patient was positioned in the usual fashion taking care that all body parts were adequately padded and/or protected.  I foley catheter was not placed.  A tourniquet was applied and the leg prepped and draped in the usual sterile fashion.  The extremity was exsanguinated with the esmarch and tourniquet inflated to 350 mmHg.  Pre-operative range of motion was normal.  The knee was in 5 degree of mild varus.  A midline incision approximately 6-7 inches long was made with a #10 blade.  A new blade was used to make a parapatellar arthrotomy going 2-3 cm into the quadriceps tendon, over the patella, and alongside the medial aspect of the patellar tendon.  A synovectomy was then performed with the #10 blade and forceps. I then elevated the deep MCL off the medial tibial metaphysis  subperiosteally around to the semimembranosus attachment.    I everted the patella and used calipers to measure patellar thickness.  I used the reamer to ream down to appropriate thickness to recreate the native thickness.  I then removed excess bone with the rongeur and sagittal saw.  I used the appropriately sized template and drilled the three lug holes.  I then put the trial in place and measured the thickness with the calipers to ensure recreation of the native thickness.  The trial was then removed and the patella subluxed and the knee brought into flexion.  A homan retractor was place to retract and protect the patella and lateral structures.  A Z-retractor was place medially to protect the medial structures.  The extra-medullary alignment system was used to make cut the tibial articular surface perpendicular to the anamotic axis of the tibia and in 3 degrees of posterior slope.  The cut surface and alignment jig was removed.  I then used the intramedullary alignment guide to make a 6 valgus cut on the distal femur.  I then marked out the epicondylar axis on the distal femur.  The posterior condylar axis measured 3 degrees.  I then used the anterior referencing sizer and measured the femur to be a size 10.  The 4-In-1 cutting block was screwed into place in external rotation matching the posterior condylar angle, making our cuts perpendicular to the epicondylar axis.  Anterior, posterior and chamfer cuts were made with the sagittal saw.  The cutting block and cut pieces were removed.  A  lamina spreader was placed in 90 degrees of flexion.  The ACL, PCL, menisci, and posterior condylar osteophytes were removed.  A 13 mm spacer blocked was found to offer good flexion and extension gap balance after moderate in degree releasing.   The scoop retractor was then placed and the femoral finishing block was pinned in place.  The small sagittal saw was used as well as the lug drill to finish the femur.  The  block and cut surfaces were removed and the medullary canal hole filled with autograft bone from the cut pieces.  The tibia was delivered forward in deep flexion and external rotation.  A size F tray was selected and pinned into place centered on the medial 1/3 of the tibial tubercle.  The reamer and keel was used to prepare the tibia through the tray.    I then trialed with the size 10 femur, size F tibia, a 13 mm insert and the 38 patella.  I had excellent flexion/extension gap balance, excellent patella tracking.  Flexion was full and beyond 120 degrees; extension was zero.  These components were chosen and the staff opened them to me on the back table while the knee was lavaged copiously and the cement mixed.  The soft tissue was infiltrated with 60cc of exparel 1.3% through a 21 gauge needle.  I cemented in the components and removed all excess cement.  The polyethylene tibial component was snapped into place and the knee placed in extension while cement was hardening.  The capsule was infilltrated with 30cc of .25% Marcaine with epinephrine.  A hemovac was place in the joint exiting superolaterally.  A pain pump was place superomedially superficial to the arthrotomy.  Once the cement was hard, the tourniquet was let down.  Hemostasis was obtained.  The arthrotomy was closed with figure-8 #1 vicryl sutures.  The deep soft tissues were closed with #0 vicryls and the subcuticular layer closed with a running #2-0 vicryl.  The skin was reapproximated and closed with skin staples.  The wound was dressed with xeroform, 4 x4's, 2 ABD sponges, a single layer of webril and a TED stocking.   The patient was then awakened, extubated, and taken to the recovery room in stable condition.  BLOOD LOSS:  300cc DRAINS: 1 hemovac, 1 pain catheter COMPLICATIONS:  None.  PLAN OF CARE: Admit to inpatient   PATIENT DISPOSITION:  PACU - hemodynamically stable.   Delay start of Pharmacological VTE agent (>24hrs) due  to surgical blood loss or risk of bleeding:  not applicable  Please fax a copy of this op note to my office at [phone] (please only include page 1 and 2 of the Case Information op note)

## 2014-09-27 DIAGNOSIS — I10 Essential (primary) hypertension: Secondary | ICD-10-CM | POA: Diagnosis not present

## 2014-09-27 DIAGNOSIS — E780 Pure hypercholesterolemia: Secondary | ICD-10-CM | POA: Diagnosis not present

## 2014-09-27 DIAGNOSIS — M1990 Unspecified osteoarthritis, unspecified site: Secondary | ICD-10-CM | POA: Diagnosis not present

## 2014-09-27 DIAGNOSIS — Z471 Aftercare following joint replacement surgery: Secondary | ICD-10-CM | POA: Diagnosis not present

## 2014-09-27 DIAGNOSIS — Z96652 Presence of left artificial knee joint: Secondary | ICD-10-CM | POA: Diagnosis not present

## 2014-09-27 DIAGNOSIS — K449 Diaphragmatic hernia without obstruction or gangrene: Secondary | ICD-10-CM | POA: Diagnosis not present

## 2014-09-28 DIAGNOSIS — Z471 Aftercare following joint replacement surgery: Secondary | ICD-10-CM | POA: Diagnosis not present

## 2014-09-28 DIAGNOSIS — Z96652 Presence of left artificial knee joint: Secondary | ICD-10-CM | POA: Diagnosis not present

## 2014-09-28 DIAGNOSIS — E780 Pure hypercholesterolemia: Secondary | ICD-10-CM | POA: Diagnosis not present

## 2014-09-28 DIAGNOSIS — I10 Essential (primary) hypertension: Secondary | ICD-10-CM | POA: Diagnosis not present

## 2014-09-28 DIAGNOSIS — K449 Diaphragmatic hernia without obstruction or gangrene: Secondary | ICD-10-CM | POA: Diagnosis not present

## 2014-09-28 DIAGNOSIS — M1990 Unspecified osteoarthritis, unspecified site: Secondary | ICD-10-CM | POA: Diagnosis not present

## 2014-10-01 DIAGNOSIS — K449 Diaphragmatic hernia without obstruction or gangrene: Secondary | ICD-10-CM | POA: Diagnosis not present

## 2014-10-01 DIAGNOSIS — Z471 Aftercare following joint replacement surgery: Secondary | ICD-10-CM | POA: Diagnosis not present

## 2014-10-01 DIAGNOSIS — M1990 Unspecified osteoarthritis, unspecified site: Secondary | ICD-10-CM | POA: Diagnosis not present

## 2014-10-01 DIAGNOSIS — E780 Pure hypercholesterolemia: Secondary | ICD-10-CM | POA: Diagnosis not present

## 2014-10-01 DIAGNOSIS — Z96652 Presence of left artificial knee joint: Secondary | ICD-10-CM | POA: Diagnosis not present

## 2014-10-01 DIAGNOSIS — I10 Essential (primary) hypertension: Secondary | ICD-10-CM | POA: Diagnosis not present

## 2014-10-02 DIAGNOSIS — Z471 Aftercare following joint replacement surgery: Secondary | ICD-10-CM | POA: Diagnosis not present

## 2014-10-02 DIAGNOSIS — Z96652 Presence of left artificial knee joint: Secondary | ICD-10-CM | POA: Diagnosis not present

## 2014-10-03 DIAGNOSIS — M1990 Unspecified osteoarthritis, unspecified site: Secondary | ICD-10-CM | POA: Diagnosis not present

## 2014-10-03 DIAGNOSIS — Z471 Aftercare following joint replacement surgery: Secondary | ICD-10-CM | POA: Diagnosis not present

## 2014-10-03 DIAGNOSIS — I10 Essential (primary) hypertension: Secondary | ICD-10-CM | POA: Diagnosis not present

## 2014-10-03 DIAGNOSIS — Z96652 Presence of left artificial knee joint: Secondary | ICD-10-CM | POA: Diagnosis not present

## 2014-10-03 DIAGNOSIS — K449 Diaphragmatic hernia without obstruction or gangrene: Secondary | ICD-10-CM | POA: Diagnosis not present

## 2014-10-03 DIAGNOSIS — E780 Pure hypercholesterolemia: Secondary | ICD-10-CM | POA: Diagnosis not present

## 2014-10-05 DIAGNOSIS — Z96652 Presence of left artificial knee joint: Secondary | ICD-10-CM | POA: Diagnosis not present

## 2014-10-05 DIAGNOSIS — E780 Pure hypercholesterolemia: Secondary | ICD-10-CM | POA: Diagnosis not present

## 2014-10-05 DIAGNOSIS — M1990 Unspecified osteoarthritis, unspecified site: Secondary | ICD-10-CM | POA: Diagnosis not present

## 2014-10-05 DIAGNOSIS — I10 Essential (primary) hypertension: Secondary | ICD-10-CM | POA: Diagnosis not present

## 2014-10-05 DIAGNOSIS — K449 Diaphragmatic hernia without obstruction or gangrene: Secondary | ICD-10-CM | POA: Diagnosis not present

## 2014-10-05 DIAGNOSIS — Z471 Aftercare following joint replacement surgery: Secondary | ICD-10-CM | POA: Diagnosis not present

## 2014-10-09 ENCOUNTER — Ambulatory Visit (HOSPITAL_COMMUNITY): Payer: Commercial Managed Care - HMO | Attending: Orthopedic Surgery | Admitting: Physical Therapy

## 2014-10-09 DIAGNOSIS — R29898 Other symptoms and signs involving the musculoskeletal system: Secondary | ICD-10-CM

## 2014-10-09 DIAGNOSIS — R609 Edema, unspecified: Secondary | ICD-10-CM | POA: Diagnosis not present

## 2014-10-09 DIAGNOSIS — M25562 Pain in left knee: Secondary | ICD-10-CM | POA: Insufficient documentation

## 2014-10-09 DIAGNOSIS — Z96652 Presence of left artificial knee joint: Secondary | ICD-10-CM | POA: Diagnosis not present

## 2014-10-09 DIAGNOSIS — M6289 Other specified disorders of muscle: Secondary | ICD-10-CM | POA: Diagnosis not present

## 2014-10-09 DIAGNOSIS — M25662 Stiffness of left knee, not elsewhere classified: Secondary | ICD-10-CM | POA: Insufficient documentation

## 2014-10-09 DIAGNOSIS — R262 Difficulty in walking, not elsewhere classified: Secondary | ICD-10-CM | POA: Diagnosis not present

## 2014-10-09 DIAGNOSIS — M6281 Muscle weakness (generalized): Secondary | ICD-10-CM

## 2014-10-09 NOTE — Patient Instructions (Signed)
   QUAD SET WITH TOWEL UNDER HEEL  While lying or sitting with a small towel roll under your ankle, tighten your top thigh muscle to press the back of your knee downward towards the ground. Hold for 3-5 seconds.  Repeat 20 times, 2-3 times per day.    STANDING HAMSTRING CURLS  While standing, bend your knee so that your heel moves towards your buttock.  Hold for 2 seconds. Repeat 15 times, 2-3 times per day.    WEIGHT SHIFT - LATERAL  While in a standing position and knees partially bent, slowly shift your body weight side-to-side.   Repeat 20 times, 2-3 times per day, making sure to put as much weight as you can tolerated down through your left leg.        STANDING HEEL RAISES and STANDING TOE RAISES.   While standing, raise up on your toes as you lift your heels off the ground. Repeat 15 times, 2-3 times per day.  In a standing position with your feet on the ground, raise up your forefoot and toes as you bend at your ankle. Repeat 15 times, 2-3 times per day.

## 2014-10-09 NOTE — Therapy (Signed)
[place], [place], [address] Phone: [phone]   Fax:  [phone]  Physical Therapy Evaluation  Patient Details  Name: Harold Nicholson MRN: [id number] Date of Birth: 01/09/1951 Referring Provider:  Vickey Huger, MD  Encounter Date: 10/09/2014      PT End of Session - 10/09/14 1019    Visit Number 1   Number of Visits 18   Date for PT Re-Evaluation 10/30/14   Authorization Type [employer]    Authorization Time Period 10/09/14 to 12/09/14   Authorization - Visit Number 1   Authorization - Number of Visits 10   PT Start Time 0932   PT Stop Time 1018   PT Time Calculation (min) 46 min   Activity Tolerance Patient tolerated treatment well   Behavior During Therapy [facility] for tasks assessed/performed      Past Medical History  Diagnosis Date  . Hypertension   . Hypercholesteremia   . Hiatal hernia   . Pure hyperglyceridemia   . Thyroid disease     Hypothyroid   . Impaired fasting glucose   . Arthritis   . Hypothyroidism   . Complication of anesthesia     pt stated he stopped breathing during surgery    Past Surgical History  Procedure Laterality Date  . Right knee      X 2  . Left knee arthroscopy    . Colonoscopy  10/07/2010    Procedure: COLONOSCOPY;  Surgeon: Jamesetta So;  [facility];  Service: Gastroenterology;  Laterality: N/A;  . Esophagogastroduodenoscopy  10/07/2010    Procedure: ESOPHAGOGASTRODUODENOSCOPY (EGD);  Surgeon: Jamesetta So;  [facility];  Service: Gastroenterology;  Laterality: N/A;  . Joint replacement      Right knee  . Cholecystectomy    . Cholecystectomy N/A 05/01/2013    Procedure: LAPAROSCOPIC CHOLECYSTECTOMY;  Surgeon: Jamesetta So, MD;  Location: [facility];  Service: General;  Laterality: N/A;  . Liver biopsy N/A 05/01/2013    Procedure: LIVER BIOPSY;  Surgeon: Jamesetta So, MD;  Location: [facility];  Service: General;  Laterality: N/A;  .  Eye surgery    . Total knee arthroplasty Left 09/17/2014    Procedure: TOTAL KNEE ARTHROPLASTY;  Surgeon: Vickey Huger, MD;  Location: [place];  Service: Orthopedics;  Laterality: Left;    There were no vitals filed for this visit.  Visit Diagnosis:  Status post total left knee replacement - Plan: PT plan of care cert/re-cert  Left knee pain - Plan: PT plan of care cert/re-cert  Edema - Plan: PT plan of care cert/re-cert  Difficulty walking - Plan: PT plan of care cert/re-cert  Knee stiffness, left - Plan: PT plan of care cert/re-cert  Leg weakness, bilateral - Plan: PT plan of care cert/re-cert  Proximal muscle weakness - Plan: PT plan of care cert/re-cert      Subjective Assessment - 10/09/14 0934    Subjective "..knee just doesn't feel like the other one". Knee is mostly painful at night, feels good to walk on it. Reports stairs are going well. Also reports knee is stiff.    Pertinent History L knee was replaced on August 1st by Dr. Ronnie Derby; got HHPT but has been discharged from thier services. On Lovenox as needed per patient.    How long can you sit comfortably? 8/23- patient reports he really does not sit much, fairly active    How long can you stand comfortably? 8/23- 15-20  minutes    How long can you walk comfortably? 8/23- hasn't noticed a lot of problems walking yet    Patient Stated Goals get knee back to the way it was    Currently in Pain? No/denies  but reports that he only gets sharp pain every once in awhile            [facility] PT Assessment - 10/09/14 0001    Assessment   Medical Diagnosis L total knee replacement    Onset Date/Surgical Date 09/17/14   Next MD Visit September with Dr. Ronnie Derby    Precautions   Precautions None   Restrictions   Weight Bearing Restrictions No   Balance Screen   Has the patient fallen in the past 6 months No   Has the patient had a decrease in activity level because of a fear of falling?  Yes   Is the patient reluctant to leave  their home because of a fear of falling?  No   Prior Function   Level of Independence Independent;Independent with basic ADLs;Independent with gait;Independent with transfers   Vocation On disability   Leisure no specific hobbies    Observation/Other Assessments   Observations incision appears to be healing well, but still has a couple of open areas; no signs of infection or inflammation present, steristrips still applied    Focus on Therapeutic Outcomes (FOTO)  65% limited    Posture/Postural Control   Posture Comments flexed at hips, reduced weight bearing L LE, flexion L LE, forward head with B IR shoulders, varus knees    AROM   Left Knee Extension 10   Left Knee Flexion 100   Strength   Right Hip Flexion 5/5   Right Hip ABduction 4-/5   Left Hip Flexion 5/5   Left Hip ABduction 4-/5   Right Knee Flexion 4+/5   Right Knee Extension 4+/5   Left Knee Flexion 3+/5   Left Knee Extension 3+/5   Right Ankle Dorsiflexion 5/5   Left Ankle Dorsiflexion 4/5   Ambulation/Gait   Gait Comments reduced gait speed,  varus knees, pronation B feet/ankles, reduced rotation of trunk and pelvis, reduced TKE L, reduced stance time L/step length R                    [facility] Adult PT Treatment/Exercise - 10/09/14 0001    Ambulation/Gait   Ambulation/Gait --   Knee/Hip Exercises: Stretches   Active Hamstring Stretch Both;3 reps;30 seconds   Active Hamstring Stretch Limitations stairs    Education officer, community Both;3 reps;30 seconds   Gastroc Stretch Limitations slantboard    Knee/Hip Exercises: Standing   Rocker Board Limitations x20AP, x20 lateral B HHA    Gait Training x246ft with SPC, cues for focus on heel-toe gait pattern and posture during gait                 PT Education - 10/09/14 1018    Education provided Yes   Education Details prognosis, plan of care moving forward, HEP    Person(s) Educated Patient   Methods Explanation;Handout   Comprehension  Verbalized understanding;Returned demonstration          PT Short Term Goals - 10/09/14 1026    PT SHORT TERM GOAL #1   Title Patient will demonstate L knee extension of no more than 5 degrees and L knee flexion of no less than 110 degrees    Time 3   Period Weeks  Status New   PT SHORT TERM GOAL #2   Title Patient will be able to ambulate unlimited distances with cane and improved gait including equal step lengths, increased stance time L LE, improved posture, improved gait speed, increased trunk and pelvic rotation    Time 3   Period Weeks   Status New   PT SHORT TERM GOAL #3   Title Patient will be able to verbalize and correctly demonstrate appropriate strategies to assist in home management for reducing edema L knee    Time 3   Period Weeks   Status New   PT SHORT TERM GOAL #4   Title Patient will be independent in correctly and consistently performing appropriate HEP, to be updated PRN    Time 3   Period Weeks   Status New           PT Long Term Goals - 10/09/14 1028    PT LONG TERM GOAL #1   Title Patient will demonstrate L knee extension of 0 degrees and L knee flexion of at least 120 degrees    Time 6   Period Weeks   Status New   PT LONG TERM GOAL #2   Title Patient will demonstrate 5/5 strength in bilateral lower extremities and at least 4+/5 strength in proximal musculature    Time 6   Period Weeks   Status New   PT LONG TERM GOAL #3   Title Patient will be able to ambulate unlimited distances over even and uneven surfaces with no assistive device and pain 0/10 L knee    Time 6   Period Weeks   Status New   PT LONG TERM GOAL #4   Title Patient will be able to reciprocally ascend and descend full flight of stairs with no railings, step over step pattern with no circumduction, good eccentric control, and pain L knee 0/10   Time 6   Period Weeks   Status New               Plan - 10/09/14 1021    Clinical Impression Statement Patient presents  status post L total knee replacement with postural and gait deviations, L knee discomfort, edema, bilateral lower extremiety and proximal muscle weakness, L knee stiffness, and reduced functional task performance skills. The patient reports he is trying to use his cane as much as possible and also tries to go without it in the house sometimes, just does not want to fall. He is really not having a lot of pain except for at night, and except for occasional sharp pains. MD requests 2-3x/week on referral, and patient reports that due to co-pay 2x/week may be more feasible for them. At this time patient will beneftit from skilled PT services to address his functional impairments and assist him in reaching an optimal level of function.    Pt will benefit from skilled therapeutic intervention in order to improve on the following deficits Abnormal gait;Decreased skin integrity;Hypomobility;Decreased scar mobility;Increased edema;Decreased strength;Pain;Decreased mobility;Difficulty walking;Decreased range of motion;Decreased coordination;Impaired flexibility   Rehab Potential Good   PT Frequency Other (comment)  2-3 times/week    PT Duration 6 weeks   PT Treatment/Interventions ADLs/Self Care Home Management;Cryotherapy;DME Instruction;Gait training;Stair training;Functional mobility training;Therapeutic activities;Therapeutic exercise;Balance training;Neuromuscular re-education;Patient/family education;Manual techniques   PT Next Visit Plan review HEP and goals; functional stretching and exercises for knee mobility and strength as tolerated, manual PRN    PT Home Exercise Plan given    Consulted and Agree with Plan  of Care Patient          G-Codes - 2014-10-28 1031    Functional Assessment Tool Used FOTO 65% limited    Functional Limitation Mobility: Walking and moving around   Mobility: Walking and Moving Around Current Status [phone]) At least 60 percent but less than 80 percent impaired, limited or  restricted   Mobility: Walking and Moving Around Goal Status [phone]) At least 40 percent but less than 60 percent impaired, limited or restricted       Problem List Patient Active Problem List   Diagnosis Date Noted  . S/P total knee arthroplasty 09/17/2014  . Leg pain 10/14/2011    Deniece Ree PT, DPT [place] [address] [place], [place], [address] Phone: [phone]   Fax:  [phone]

## 2014-10-09 NOTE — Discharge Summary (Signed)
SPORTS MEDICINE & JOINT REPLACEMENT   Lara Mulch, MD   Carlynn Spry, PA-C [place], [place], [place]  [address]                             [phone]  PATIENT ID: Harold Nicholson        MRN:  [id number]          DOB/AGE: [birth year] / 64 y.o.    DISCHARGE SUMMARY  ADMISSION DATE:    09/17/2014 DISCHARGE DATE:   09/18/2014  ADMISSION DIAGNOSIS: PRIMARY OSTEOARTHRITIS LEFT KNEE    DISCHARGE DIAGNOSIS:  PRIMARY OSTEOARTHRITIS LEFT KNEE    ADDITIONAL DIAGNOSIS: Active Problems:   S/P total knee arthroplasty  Past Medical History  Diagnosis Date  . Hypertension   . Hypercholesteremia   . Hiatal hernia   . Pure hyperglyceridemia   . Thyroid disease     Hypothyroid   . Impaired fasting glucose   . Arthritis   . Hypothyroidism   . Complication of anesthesia     pt stated he stopped breathing during surgery    PROCEDURE: Procedure(s): TOTAL KNEE ARTHROPLASTY on 09/17/2014  CONSULTS:     HISTORY:  See H&P in chart  HOSPITAL COURSE:  Harold Nicholson is a 64 y.o. admitted on 09/17/2014 and found to have a diagnosis of [facility].  After appropriate laboratory studies were obtained  they were taken to the operating room on 09/17/2014 and underwent Procedure(s): TOTAL KNEE ARTHROPLASTY.   They were given perioperative antibiotics:  Anti-infectives    Start     Dose/Rate Route Frequency Ordered Stop   09/17/14 1800  ceFAZolin (ANCEF) IVPB 1 g/50 mL premix     1 g 100 mL/hr over 30 Minutes Intravenous Every 6 hours 09/17/14 1323 09/17/14 2355   09/17/14 0730  ceFAZolin (ANCEF) 3 g in dextrose 5 % 50 mL IVPB     3 g 160 mL/hr over 30 Minutes Intravenous To [facility] 09/14/14 1113 09/17/14 1030    .  Tolerated the procedure well.  Placed with a foley intraoperatively.  Given Ofirmev at induction and for 48 hours.    POD# 1: Vital signs were stable.  Patient denied Chest pain, shortness of breath, or calf pain.  Patient was started on  Lovenox 30 mg subcutaneously twice daily at 8am.  Consults to PT, OT, and care management were made.  The patient was weight bearing as tolerated.  CPM was placed on the operative leg 0-90 degrees for 6-8 hours a day.  Incentive spirometry was taught.  Dressing was changed.  Hemovac was discontinued.      POD #2, Continued  PT for ambulation and exercise program.  IV saline locked.  O2 discontinued.    The remainder of the hospital course was dedicated to ambulation and strengthening.   The patient was discharged on 2 days post op in  Good condition.  Blood products given:none  DIAGNOSTIC STUDIES: Recent vital signs: No data found.      Recent laboratory studies: No results for input(s): WBC, HGB, HCT, PLT in the last 168 hours. No results for input(s): NA, K, CL, CO2, BUN, CREATININE, GLUCOSE, CALCIUM in the last 168 hours. Lab Results  Component Value Date   INR 1.24 09/11/2014     Recent Radiographic Studies :  Dg Chest 2 View  09/11/2014   CLINICAL DATA:  Preop knee replacement, hypertension.  EXAM: CHEST  2  VIEW  COMPARISON:  None.  FINDINGS: The heart size and mediastinal contours are within normal limits. Both lungs are clear. The visualized skeletal structures are unremarkable.  IMPRESSION: No active cardiopulmonary disease.   Electronically Signed   By: Marijo Conception, M.D.   On: 09/11/2014 15:59    DISCHARGE INSTRUCTIONS: Discharge Instructions    CPM    Complete by:  As directed   Continuous passive motion machine (CPM):      Use the CPM from 0 to 90 for 6-8 hours per day.      You may increase by 10 per day.  You may break it up into 2 or 3 sessions per day.      Use CPM for 2 weeks or until you are told to stop.     Call MD / Call 911    Complete by:  As directed   If you experience chest pain or shortness of breath, CALL 911 and be transported to the hospital emergency room.  If you develope a fever above 101 F, pus (white drainage) or increased drainage or redness  at the wound, or calf pain, call your surgeon's office.     Change dressing    Complete by:  As directed   Change dressing on wednesday, then change the dressing daily with sterile 4 x 4 inch gauze dressing and apply TED hose.     Constipation Prevention    Complete by:  As directed   Drink plenty of fluids.  Prune juice may be helpful.  You may use a stool softener, such as Colace (over the counter) 100 mg twice a day.  Use MiraLax (over the counter) for constipation as needed.     Diet - low sodium heart healthy    Complete by:  As directed      Do not put a pillow under the knee. Place it under the heel.    Complete by:  As directed      Driving restrictions    Complete by:  As directed   No driving for 6 weeks     Increase activity slowly as tolerated    Complete by:  As directed      Lifting restrictions    Complete by:  As directed   No lifting for 6 weeks     TED hose    Complete by:  As directed   Use stockings (TED hose) for 2 weeks on both leg(s).  You may remove them at night for sleeping.           DISCHARGE MEDICATIONS:     Medication List    STOP taking these medications        aspirin 81 MG tablet      TAKE these medications        bisoprolol-hydrochlorothiazide 10-6.25 MG per tablet  Commonly known as:  ZIAC  Take 1 tablet by mouth daily.     cholecalciferol 1000 UNITS tablet  Commonly known as:  VITAMIN D  Take 1,000 Units by mouth daily.     enoxaparin 40 MG/0.4ML injection  Commonly known as:  LOVENOX  Inject 0.4 mLs (40 mg total) into the skin daily.     fenofibrate 160 MG tablet  Take 160 mg by mouth daily.     hydrochlorothiazide 25 MG tablet  Commonly known as:  HYDRODIURIL  Take 12.5 mg by mouth daily.     levothyroxine 175 MCG tablet  Commonly known as:  SYNTHROID, LEVOTHROID  Take 175 mcg by mouth daily before breakfast.     loratadine 10 MG tablet  Commonly known as:  CLARITIN  Take 10 mg by mouth daily.     omeprazole 40 MG  capsule  Commonly known as:  PRILOSEC  Take 40 mg by mouth daily.     oxyCODONE 5 MG immediate release tablet  Commonly known as:  Oxy IR/ROXICODONE  Take 1-2 tablets (5-10 mg total) by mouth every 4 (four) hours as needed for breakthrough pain.     Oxycodone HCl 10 MG Tabs  Take 1 tablet (10 mg total) by mouth 2 (two) times daily.     potassium gluconate 595 MG Tabs tablet  Take 1,190 mg by mouth daily.     tiZANidine 4 MG tablet  Commonly known as:  ZANAFLEX  Take 1 tablet (4 mg total) by mouth every 6 (six) hours as needed.        FOLLOW UP VISIT:       Follow-up Information    Follow up with [facility].   Why:  THey will contact you to schedule home therapy visits.   Contact information:   [address] [place] [address] [phone]       DISPOSITION: HOME  CONDITION:  Good   Harold Nicholson 10/09/2014, 1:25 PM

## 2014-10-12 ENCOUNTER — Ambulatory Visit (HOSPITAL_COMMUNITY): Payer: Commercial Managed Care - HMO | Admitting: Physical Therapy

## 2014-10-12 DIAGNOSIS — M25562 Pain in left knee: Secondary | ICD-10-CM | POA: Diagnosis not present

## 2014-10-12 DIAGNOSIS — M6281 Muscle weakness (generalized): Secondary | ICD-10-CM

## 2014-10-12 DIAGNOSIS — M6289 Other specified disorders of muscle: Secondary | ICD-10-CM | POA: Diagnosis not present

## 2014-10-12 DIAGNOSIS — R609 Edema, unspecified: Secondary | ICD-10-CM | POA: Diagnosis not present

## 2014-10-12 DIAGNOSIS — M25662 Stiffness of left knee, not elsewhere classified: Secondary | ICD-10-CM | POA: Diagnosis not present

## 2014-10-12 DIAGNOSIS — R262 Difficulty in walking, not elsewhere classified: Secondary | ICD-10-CM

## 2014-10-12 DIAGNOSIS — Z96652 Presence of left artificial knee joint: Secondary | ICD-10-CM

## 2014-10-12 DIAGNOSIS — R29898 Other symptoms and signs involving the musculoskeletal system: Secondary | ICD-10-CM

## 2014-10-12 NOTE — Therapy (Signed)
[place], [place], [address] Phone: [phone]   Fax:  [phone]  Physical Therapy Treatment  Patient Details  Name: Harold Nicholson MRN: [id number] Date of Birth: 1950-05-26 Referring Provider:  Sharilyn Sites, MD  Encounter Date: 10/12/2014      PT End of Session - 10/12/14 1202    Visit Number 2   Number of Visits 18   Date for PT Re-Evaluation 10/30/14   Authorization Type [employer]    Authorization Time Period 10/09/14 to 12/09/14   Authorization - Visit Number 2   Authorization - Number of Visits 10   PT Start Time 1104   PT Stop Time 1159   PT Time Calculation (min) 55 min   Activity Tolerance Patient tolerated treatment well   Behavior During Therapy [facility] for tasks assessed/performed      Past Medical History  Diagnosis Date  . Hypertension   . Hypercholesteremia   . Hiatal hernia   . Pure hyperglyceridemia   . Thyroid disease     Hypothyroid   . Impaired fasting glucose   . Arthritis   . Hypothyroidism   . Complication of anesthesia     pt stated he stopped breathing during surgery    Past Surgical History  Procedure Laterality Date  . Right knee      X 2  . Left knee arthroscopy    . Colonoscopy  10/07/2010    Procedure: COLONOSCOPY;  Surgeon: Jamesetta So;  [facility];  Service: Gastroenterology;  Laterality: N/A;  . Esophagogastroduodenoscopy  10/07/2010    Procedure: ESOPHAGOGASTRODUODENOSCOPY (EGD);  Surgeon: Jamesetta So;  [facility];  Service: Gastroenterology;  Laterality: N/A;  . Joint replacement      Right knee  . Cholecystectomy    . Cholecystectomy N/A 05/01/2013    Procedure: LAPAROSCOPIC CHOLECYSTECTOMY;  Surgeon: Jamesetta So, MD;  Location: [facility];  Service: General;  Laterality: N/A;  . Liver biopsy N/A 05/01/2013    Procedure: LIVER BIOPSY;  Surgeon: Jamesetta So, MD;  Location: [facility];  Service: General;  Laterality: N/A;  .  Eye surgery    . Total knee arthroplasty Left 09/17/2014    Procedure: TOTAL KNEE ARTHROPLASTY;  Surgeon: Vickey Huger, MD;  Location: [place];  Service: Orthopedics;  Laterality: Left;    There were no vitals filed for this visit.  Visit Diagnosis:  Status post total left knee replacement  Left knee pain  Edema  Difficulty walking  Knee stiffness, left  Leg weakness, bilateral  Proximal muscle weakness      Subjective Assessment - 10/12/14 1208    Subjective Pt states the swelling is bad in his knee all the way to his foot.  States she wants to elevate it but was told not to place antything under his leg.  Currenty wtih an ache rather than pain in his Lt knee.    Currently in Pain? No/denies                         [facility] Adult PT Treatment/Exercise - 10/12/14 1113    Knee/Hip Exercises: Stretches   Active Hamstring Stretch Both;3 reps;30 seconds   Active Hamstring Stretch Limitations 12" step   Gastroc Stretch Both;3 reps;30 seconds   Gastroc Stretch Limitations slantboard    Knee/Hip Exercises: Aerobic   Stationary Bike 8 minutes full revolutions seat 12   Knee/Hip Exercises: Standing  Heel Raises Both;10 reps   Heel Raises Limitations toeraises 10 reps   Knee Flexion Left;10 reps   Rocker Board Limitations x20AP, x20 lateral B HHA    Gait Training heel toe gait with SPC 100 feet   Manual Therapy   Manual Therapy Edema management;Soft tissue mobilization   Manual therapy comments Lt LE with elevation   Edema Management Retro massage   Soft tissue mobilization scar massage and MFR to perimeter of knee                PT Education - 10/12/14 1201    Education provided Yes   Education Details elevation, massage and ice to Lt LE and general mobility exericses.  Sign and symptoms of infection and blood clot   Person(s) Educated Patient   Methods Explanation   Comprehension Verbalized understanding          PT Short Term Goals - 10/09/14  1026    PT SHORT TERM GOAL #1   Title Patient will demonstate L knee extension of no more than 5 degrees and L knee flexion of no less than 110 degrees    Time 3   Period Weeks   Status New   PT SHORT TERM GOAL #2   Title Patient will be able to ambulate unlimited distances with cane and improved gait including equal step lengths, increased stance time L LE, improved posture, improved gait speed, increased trunk and pelvic rotation    Time 3   Period Weeks   Status New   PT SHORT TERM GOAL #3   Title Patient will be able to verbalize and correctly demonstrate appropriate strategies to assist in home management for reducing edema L knee    Time 3   Period Weeks   Status New   PT SHORT TERM GOAL #4   Title Patient will be independent in correctly and consistently performing appropriate HEP, to be updated PRN    Time 3   Period Weeks   Status New           PT Long Term Goals - 10/09/14 1028    PT LONG TERM GOAL #1   Title Patient will demonstrate L knee extension of 0 degrees and L knee flexion of at least 120 degrees    Time 6   Period Weeks   Status New   PT LONG TERM GOAL #2   Title Patient will demonstrate 5/5 strength in bilateral lower extremities and at least 4+/5 strength in proximal musculature    Time 6   Period Weeks   Status New   PT LONG TERM GOAL #3   Title Patient will be able to ambulate unlimited distances over even and uneven surfaces with no assistive device and pain 0/10 L knee    Time 6   Period Weeks   Status New   PT LONG TERM GOAL #4   Title Patient will be able to reciprocally ascend and descend full flight of stairs with no railings, step over step pattern with no circumduction, good eccentric control, and pain L knee 0/10   Time 6   Period Weeks   Status New               Plan - 10/12/14 1203    Clinical Impression Statement Continued education and instruction with gait insuring heel to toe wtihout gait deviations.  Major limitation  at this point is edema.  Pt with noted edema from just superior of knee to dorsal foot.  Pt instructed with retro massage to reduce edema as well as elevation/ice to further reduce edema.  Pt able to verbalize understanding.  Progressed with standing exerices and bike to increase ROM.  Pt without overal improvment in gait and reduction of swelling at end of session    PT Frequency --  2-3 times/week    PT Treatment/Interventions ADLs/Self Care Home Management;Cryotherapy;DME Instruction;Gait training;Stair training;Functional mobility training;Therapeutic activities;Therapeutic exercise;Balance training;Neuromuscular re-education;Patient/family education;Manual techniques   PT Next Visit Plan review HEP and goals (forgot to give eval); functional stretching and exercises for knee mobility and strength as tolerated, manual PRN    PT Home Exercise Plan given    Consulted and Agree with Plan of Care Patient        Problem List Patient Active Problem List   Diagnosis Date Noted  . S/P total knee arthroplasty 09/17/2014  . Leg pain 10/14/2011    Teena Irani, PTA/CLT [phone]  10/12/2014, 12:09 PM  [place] [address] [place], [place], [address] Phone: [phone]   Fax:  [phone]

## 2014-10-16 ENCOUNTER — Ambulatory Visit (HOSPITAL_COMMUNITY): Payer: Commercial Managed Care - HMO | Admitting: Physical Therapy

## 2014-10-16 DIAGNOSIS — R29898 Other symptoms and signs involving the musculoskeletal system: Secondary | ICD-10-CM

## 2014-10-16 DIAGNOSIS — R609 Edema, unspecified: Secondary | ICD-10-CM | POA: Diagnosis not present

## 2014-10-16 DIAGNOSIS — Z96652 Presence of left artificial knee joint: Secondary | ICD-10-CM

## 2014-10-16 DIAGNOSIS — M25562 Pain in left knee: Secondary | ICD-10-CM

## 2014-10-16 DIAGNOSIS — M6289 Other specified disorders of muscle: Secondary | ICD-10-CM | POA: Diagnosis not present

## 2014-10-16 DIAGNOSIS — R262 Difficulty in walking, not elsewhere classified: Secondary | ICD-10-CM | POA: Diagnosis not present

## 2014-10-16 DIAGNOSIS — M6281 Muscle weakness (generalized): Secondary | ICD-10-CM

## 2014-10-16 DIAGNOSIS — M25662 Stiffness of left knee, not elsewhere classified: Secondary | ICD-10-CM

## 2014-10-16 NOTE — Therapy (Signed)
[place], [place], [address] Phone: [phone]   Fax:  [phone]  Physical Therapy Treatment  Patient Details  Name: Harold Nicholson MRN: [id number] Date of Birth: 01/28/1951 Referring Provider:  Vickey Huger, MD  Encounter Date: 10/16/2014      PT End of Session - 10/16/14 1059    Visit Number 3   Number of Visits 18   Date for PT Re-Evaluation 10/30/14   Authorization Type [employer]    Authorization Time Period 10/09/14 to 12/09/14   Authorization - Visit Number 3   Authorization - Number of Visits 10   PT Start Time 1010   PT Stop Time 1105   PT Time Calculation (min) 55 min   Activity Tolerance Patient tolerated treatment well   Behavior During Therapy [facility] for tasks assessed/performed      Past Medical History  Diagnosis Date  . Hypertension   . Hypercholesteremia   . Hiatal hernia   . Pure hyperglyceridemia   . Thyroid disease     Hypothyroid   . Impaired fasting glucose   . Arthritis   . Hypothyroidism   . Complication of anesthesia     pt stated he stopped breathing during surgery    Past Surgical History  Procedure Laterality Date  . Right knee      X 2  . Left knee arthroscopy    . Colonoscopy  10/07/2010    Procedure: COLONOSCOPY;  Surgeon: Jamesetta So;  [facility];  Service: Gastroenterology;  Laterality: N/A;  . Esophagogastroduodenoscopy  10/07/2010    Procedure: ESOPHAGOGASTRODUODENOSCOPY (EGD);  Surgeon: Jamesetta So;  [facility];  Service: Gastroenterology;  Laterality: N/A;  . Joint replacement      Right knee  . Cholecystectomy    . Cholecystectomy N/A 05/01/2013    Procedure: LAPAROSCOPIC CHOLECYSTECTOMY;  Surgeon: Jamesetta So, MD;  [facility];  Service: General;  Laterality: N/A;  . Liver biopsy N/A 05/01/2013    Procedure: LIVER BIOPSY;  Surgeon: Jamesetta So, MD;  [facility];  Service: General;  Laterality: N/A;  .  Eye surgery    . Total knee arthroplasty Left 09/17/2014    Procedure: TOTAL KNEE ARTHROPLASTY;  Surgeon: Vickey Huger, MD;  Location: [place];  Service: Orthopedics;  Laterality: Left;    There were no vitals filed for this visit.  Visit Diagnosis:  Status post total left knee replacement  Left knee pain  Edema  Difficulty walking  Knee stiffness, left  Leg weakness, bilateral  Proximal muscle weakness      Subjective Assessment - 10/16/14 1013    Subjective Pt states his knee is still swelling and aches when he lays down at night.  No real pain currently but does have swelling and moderate heat around knee, very little redness.    Currently in Pain? No/denies            [facility] PT Assessment - 10/16/14 0001    AROM   Left Knee Extension 5  was 10 degrees 8/23   Left Knee Flexion 115  was 100 degrees 8/23                     [facility] Adult PT Treatment/Exercise - 10/16/14 1017    Knee/Hip Exercises: Stretches   Active Hamstring Stretch Both;3 reps;30 seconds   Active Hamstring Stretch Limitations 12" step   Knee: Self-Stretch to increase Flexion Left;10 seconds  Knee: Self-Stretch Limitations 10 reps onto 12" step   Gastroc Stretch Both;3 reps;30 seconds   Gastroc Stretch Limitations slantboard    Knee/Hip Exercises: Standing   Heel Raises Both;10 reps   Heel Raises Limitations toeraises 10 reps   Knee Flexion Left;10 reps   Forward Lunges Both;10 reps   Forward Lunges Limitations 4" step   Lateral Step Up Left;10 reps;Step Height: 4";Hand Hold: 1   Forward Step Up Left;10 reps;Hand Hold: 1;Step Height: 4"   Manual Therapy   Manual Therapy Edema management;Soft tissue mobilization   Manual therapy comments Lt LE with elevation   Edema Management Retro massage   Soft tissue mobilization scar massage and MFR to perimeter of knee                PT Education - 10/16/14 1028    Education provided Yes   Education Details given copy of  evaluation and explained goals and measurements   Person(s) Educated Patient   Methods Explanation;Handout   Comprehension Verbalized understanding          PT Short Term Goals - 10/16/14 1058    PT SHORT TERM GOAL #1   Title Patient will demonstate L knee extension of no more than 5 degrees and L knee flexion of no less than 110 degrees    Time 3   Period Weeks   Status Achieved   PT SHORT TERM GOAL #2   Title Patient will be able to ambulate unlimited distances with cane and improved gait including equal step lengths, increased stance time L LE, improved posture, improved gait speed, increased trunk and pelvic rotation    Time 3   Period Weeks   Status Achieved   PT SHORT TERM GOAL #3   Title Patient will be able to verbalize and correctly demonstrate appropriate strategies to assist in home management for reducing edema L knee    Time 3   Period Weeks   Status Achieved   PT SHORT TERM GOAL #4   Title Patient will be independent in correctly and consistently performing appropriate HEP, to be updated PRN    Time 3   Period Weeks   Status Achieved           PT Long Term Goals - 10/16/14 1058    PT LONG TERM GOAL #1   Title Patient will demonstrate L knee extension of 0 degrees and L knee flexion of at least 120 degrees    Time 6   Period Weeks   Status On-going   PT LONG TERM GOAL #2   Title Patient will demonstrate 5/5 strength in bilateral lower extremities and at least 4+/5 strength in proximal musculature    Time 6   Period Weeks   Status On-going   PT LONG TERM GOAL #3   Title Patient will be able to ambulate unlimited distances over even and uneven surfaces with no assistive device and pain 0/10 L knee    Time 6   Period Weeks   Status On-going   PT LONG TERM GOAL #4   Title Patient will be able to reciprocally ascend and descend full flight of stairs with no railings, step over step pattern with no circumduction, good eccentric control, and pain L knee 0/10    Time 6   Period Weeks   Status On-going               Plan - 10/16/14 1059    Clinical Impression Statement Pt is progressing   well with HEP and compliant with elevation, ice and having his wife massage his knee for swelling.  Educated patient on reason for tightness and numbness from surgery/scar tissue and swelling.  Pt verbalized understanding.  Lt knee ROM remeasured with 5 degree gain for extension and 15 degree gain for flexion (5-115).  Reviewed goals with all STG's currently met and progressing toward LTG's.  Added forward lunges, lateral and forward step ups today with therapist facilitation for forrm and reducing UE dependence.    PT Frequency --  2-3 times/week    PT Next Visit Plan Continue to progress functional stretching and exercises for knee mobility and strength as tolerated, manual PRN    PT Home Exercise Plan given    Consulted and Agree with Plan of Care Patient        Problem List Patient Active Problem List   Diagnosis Date Noted  . S/P total knee arthroplasty 09/17/2014  . Leg pain 10/14/2011    Amy B Frazier, PTA/CLT [phone]  10/16/2014, 11:05 AM   [facility] [address] , [place], [address] Phone: [phone]   Fax:  [phone]      

## 2014-10-18 ENCOUNTER — Ambulatory Visit (HOSPITAL_COMMUNITY): Payer: Commercial Managed Care - HMO | Attending: Orthopedic Surgery

## 2014-10-18 DIAGNOSIS — Z96652 Presence of left artificial knee joint: Secondary | ICD-10-CM | POA: Diagnosis not present

## 2014-10-18 DIAGNOSIS — M25662 Stiffness of left knee, not elsewhere classified: Secondary | ICD-10-CM | POA: Diagnosis not present

## 2014-10-18 DIAGNOSIS — M6289 Other specified disorders of muscle: Secondary | ICD-10-CM | POA: Insufficient documentation

## 2014-10-18 DIAGNOSIS — R262 Difficulty in walking, not elsewhere classified: Secondary | ICD-10-CM

## 2014-10-18 DIAGNOSIS — M25562 Pain in left knee: Secondary | ICD-10-CM | POA: Diagnosis not present

## 2014-10-18 DIAGNOSIS — R609 Edema, unspecified: Secondary | ICD-10-CM | POA: Diagnosis not present

## 2014-10-18 DIAGNOSIS — R29898 Other symptoms and signs involving the musculoskeletal system: Secondary | ICD-10-CM | POA: Diagnosis not present

## 2014-10-18 NOTE — Therapy (Signed)
[place], [place], [address] Phone: [phone]   Fax:  [phone]  Physical Therapy Treatment  Patient Details  Name: Harold Nicholson MRN: [id number] Date of Birth: 19-Mar-1950 Referring Provider:  Vickey Huger, MD  Encounter Date: 10/18/2014      PT End of Session - 10/18/14 0938    Visit Number 4   Number of Visits 18   Date for PT Re-Evaluation 10/30/14   Authorization Type [employer]    Authorization Time Period 10/09/14 to 12/09/14   Authorization - Visit Number 4   Authorization - Number of Visits 10   PT Start Time 7494   PT Stop Time 0940   PT Time Calculation (min) 45 min   Activity Tolerance Patient tolerated treatment well      Past Medical History  Diagnosis Date  . Hypertension   . Hypercholesteremia   . Hiatal hernia   . Pure hyperglyceridemia   . Thyroid disease     Hypothyroid   . Impaired fasting glucose   . Arthritis   . Hypothyroidism   . Complication of anesthesia     pt stated he stopped breathing during surgery    Past Surgical History  Procedure Laterality Date  . Right knee      X 2  . Left knee arthroscopy    . Colonoscopy  10/07/2010    Procedure: COLONOSCOPY;  Surgeon: Jamesetta So;  [facility];  Service: Gastroenterology;  Laterality: N/A;  . Esophagogastroduodenoscopy  10/07/2010    Procedure: ESOPHAGOGASTRODUODENOSCOPY (EGD);  Surgeon: Jamesetta So;  [facility];  Service: Gastroenterology;  Laterality: N/A;  . Joint replacement      Right knee  . Cholecystectomy    . Cholecystectomy N/A 05/01/2013    Procedure: LAPAROSCOPIC CHOLECYSTECTOMY;  Surgeon: Jamesetta So, MD;  [facility];  Service: General;  Laterality: N/A;  . Liver biopsy N/A 05/01/2013    Procedure: LIVER BIOPSY;  Surgeon: Jamesetta So, MD;  [facility];  Service: General;  Laterality: N/A;  . Eye surgery    . Total knee arthroplasty Left 09/17/2014   Procedure: TOTAL KNEE ARTHROPLASTY;  Surgeon: Vickey Huger, MD;  Location: [place];  Service: Orthopedics;  Laterality: Left;    There were no vitals filed for this visit.  Visit Diagnosis:  Status post total left knee replacement  Left knee pain  Edema  Difficulty walking  Knee stiffness, left  Leg weakness, bilateral      Subjective Assessment - 10/18/14 0853    Subjective Pt states he is still having swelling and aching in his knee.  Pt states he as walked with his leg ER as long as he can remeber    Currently in Pain? Yes   Pain Score 4    Pain Location Knee   Pain Orientation Left   Pain Descriptors / Indicators Aching                 [facility] Adult PT Treatment/Exercise - 10/18/14 0857    Exercises   Exercises Knee/Hip   Knee/Hip Exercises: Aerobic   Nustep hills 3 ; Level 4 x 8:00   Knee/Hip Exercises: Standing   Heel Raises 10 reps   Knee Flexion Left;10 reps   Lateral Step Up Left;10 reps;Step Height: 4"   Forward Step Up Left;10 reps   Rocker Board 2 minutes   Rocker Board Limitations RT/LT    SLS x3  Gait Training heel toe with LE neutral not ER   Knee/Hip Exercises: Supine   Quad Sets 10 reps   Heel Slides 10 reps   Terminal Knee Extension 10 reps   Knee/Hip Exercises: Prone   Hamstring Curl 10 reps   Hip Extension 10 reps   Manual Therapy   Manual Therapy Edema management;Soft tissue mobilization   Manual therapy comments Lt LE with elevation   Edema Management Retro massage                PT Education - 10/18/14 0938    Education provided Yes   Education Details Proper gait    Person(s) Educated Patient   Methods Explanation;Demonstration   Comprehension Verbalized understanding;Returned demonstration          PT Short Term Goals - 10/16/14 1058    PT SHORT TERM GOAL #1   Title Patient will demonstate L knee extension of no more than 5 degrees and L knee flexion of no less than 110 degrees    Time 3   Period Weeks    Status Achieved   PT SHORT TERM GOAL #2   Title Patient will be able to ambulate unlimited distances with cane and improved gait including equal step lengths, increased stance time L LE, improved posture, improved gait speed, increased trunk and pelvic rotation    Time 3   Period Weeks   Status Achieved   PT SHORT TERM GOAL #3   Title Patient will be able to verbalize and correctly demonstrate appropriate strategies to assist in home management for reducing edema L knee    Time 3   Period Weeks   Status Achieved   PT SHORT TERM GOAL #4   Title Patient will be independent in correctly and consistently performing appropriate HEP, to be updated PRN    Time 3   Period Weeks   Status Achieved           PT Long Term Goals - 10/16/14 1058    PT LONG TERM GOAL #1   Title Patient will demonstrate L knee extension of 0 degrees and L knee flexion of at least 120 degrees    Time 6   Period Weeks   Status On-going   PT LONG TERM GOAL #2   Title Patient will demonstrate 5/5 strength in bilateral lower extremities and at least 4+/5 strength in proximal musculature    Time 6   Period Weeks   Status On-going   PT LONG TERM GOAL #3   Title Patient will be able to ambulate unlimited distances over even and uneven surfaces with no assistive device and pain 0/10 L knee    Time 6   Period Weeks   Status On-going   PT LONG TERM GOAL #4   Title Patient will be able to reciprocally ascend and descend full flight of stairs with no railings, step over step pattern with no circumduction, good eccentric control, and pain L knee 0/10   Time 6   Period Weeks   Status On-going               Plan - 10/18/14 0939    Clinical Impression Statement (p) Pt comes into department ambulating with knee ER with no ankle motion.  Pt educated on proper gait and how it affects joints.  Pt Main concern is swelling states that at night his knee is warm as well.  Explained that this is not unusual but spent  extra time on manual.  PT Next Visit Plan (p) Continue to progress functional stretching and exercises for knee mobility and strength as tolerated, manual PRN         Problem List Patient Active Problem List   Diagnosis Date Noted  . S/P total knee arthroplasty 09/17/2014  . Leg pain 10/14/2011   Rayetta Humphrey, PT CLT [phone] 10/18/2014, 10:04 AM  [place], [place], [address] Phone: [phone]   Fax:  [phone]

## 2014-10-23 ENCOUNTER — Ambulatory Visit (HOSPITAL_COMMUNITY): Payer: Commercial Managed Care - HMO

## 2014-10-23 DIAGNOSIS — M25662 Stiffness of left knee, not elsewhere classified: Secondary | ICD-10-CM

## 2014-10-23 DIAGNOSIS — Z96652 Presence of left artificial knee joint: Secondary | ICD-10-CM | POA: Diagnosis not present

## 2014-10-23 DIAGNOSIS — M25562 Pain in left knee: Secondary | ICD-10-CM | POA: Diagnosis not present

## 2014-10-23 DIAGNOSIS — M6289 Other specified disorders of muscle: Secondary | ICD-10-CM | POA: Diagnosis not present

## 2014-10-23 DIAGNOSIS — R609 Edema, unspecified: Secondary | ICD-10-CM

## 2014-10-23 DIAGNOSIS — M6281 Muscle weakness (generalized): Secondary | ICD-10-CM

## 2014-10-23 DIAGNOSIS — R262 Difficulty in walking, not elsewhere classified: Secondary | ICD-10-CM

## 2014-10-23 DIAGNOSIS — R29898 Other symptoms and signs involving the musculoskeletal system: Secondary | ICD-10-CM

## 2014-10-23 NOTE — Therapy (Signed)
[place], [place], [address] Phone: [phone]   Fax:  [phone]  Physical Therapy Treatment  Patient Details  Name: Harold Nicholson MRN: [id number] Date of Birth: 12/10/1950 Referring Provider:  Vickey Huger, MD  Encounter Date: 10/23/2014      PT End of Session - 10/23/14 1135    Visit Number 6   Number of Visits 18   Date for PT Re-Evaluation 10/30/14   Authorization Type [employer]    Authorization Time Period 10/09/14 to 12/09/14   Authorization - Visit Number 6   Authorization - Number of Visits 10   PT Start Time 0845   PT Stop Time 0925   PT Time Calculation (min) 40 min   Equipment Utilized During Treatment Other (comment)  flat sheet   Activity Tolerance Patient tolerated treatment well;No increased pain   Behavior During Therapy [facility] for tasks assessed/performed      Past Medical History  Diagnosis Date  . Hypertension   . Hypercholesteremia   . Hiatal hernia   . Pure hyperglyceridemia   . Thyroid disease     Hypothyroid   . Impaired fasting glucose   . Arthritis   . Hypothyroidism   . Complication of anesthesia     pt stated he stopped breathing during surgery    Past Surgical History  Procedure Laterality Date  . Right knee      X 2  . Left knee arthroscopy    . Colonoscopy  10/07/2010    Procedure: COLONOSCOPY;  Surgeon: Jamesetta So;  [facility];  Service: Gastroenterology;  Laterality: N/A;  . Esophagogastroduodenoscopy  10/07/2010    Procedure: ESOPHAGOGASTRODUODENOSCOPY (EGD);  Surgeon: Jamesetta So;  [facility];  Service: Gastroenterology;  Laterality: N/A;  . Joint replacement      Right knee  . Cholecystectomy    . Cholecystectomy N/A 05/01/2013    Procedure: LAPAROSCOPIC CHOLECYSTECTOMY;  Surgeon: Jamesetta So, MD;  [facility]: [facility];  Service: General;  Laterality: N/A;  . Liver biopsy N/A 05/01/2013    Procedure: LIVER BIOPSY;   Surgeon: Jamesetta So, MD;  [facility];  Service: General;  Laterality: N/A;  . Eye surgery    . Total knee arthroplasty Left 09/17/2014    Procedure: TOTAL KNEE ARTHROPLASTY;  Surgeon: Vickey Huger, MD;  Location: [place];  Service: Orthopedics;  Laterality: Left;    There were no vitals filed for this visit.  Visit Diagnosis:  Status post total left knee replacement  Left knee pain  Edema  Difficulty walking  Knee stiffness, left  Leg weakness, bilateral  Proximal muscle weakness      Subjective Assessment - 10/23/14 0847    Subjective Pt reports he has been walking more, which feels pretty good, but can often feel worse at night. Icing is still helping, HEP is going well, just trying to manage tightness, heat, and aching at night.    Pertinent History L knee was replaced on August 1st by Dr. Ronnie Derby; got HHPT but has been discharged from thier services. On Lovenox as needed per patient.    Patient Stated Goals get knee back to the way it was    Currently in Pain? No/denies   Pain Score 0-No pain  unable to really assign a number, but reports that it is low-level, and persistent.    Pain Location Knee   Pain Orientation Left   Pain Descriptors / Indicators Aching  [facility] Adult PT Treatment/Exercise - 10/23/14 0001    Knee/Hip Exercises: Stretches   Active Hamstring Stretch 4 reps;20 seconds;Left  MET, 5sx20s   Active Hamstring Stretch Limitations supine, hip at 90 degrees   Knee: Self-Stretch to increase Flexion Left  supine with sheet around foot; 15x3sec   Knee: Self-Stretch Limitations 15x3sec   Gastroc Stretch 3 reps;30 seconds;Left   Gastroc Stretch Limitations supine, performed manually   Knee/Hip Exercises: Supine   Quad Sets 15 reps;Left;AAROM;Strengthening   [employer] 2 sets;15 reps;AROM;Left  7#   Heel Slides 15 reps;AAROM;Left  3 sec stretch    Straight Leg Raises AROM;Strengthening;Left;2 sets;15 reps   2#   Straight Leg Raises Limitations 2#   Manual Therapy   Manual Therapy Joint mobilization   Joint Mobilization Patella mods   3x60 seconds grade IV: med/lat, caudal cranial.   Soft tissue mobilization scar massage education  5 Minutes                PT Education - 10/23/14 1135    Education provided Yes   Education Details scar mobilization daily for 5 minutes   Person(s) Educated Patient   Methods Explanation;Demonstration   Comprehension Verbalized understanding;Returned demonstration          PT Short Term Goals - 10/16/14 1058    PT SHORT TERM GOAL #1   Title Patient will demonstate L knee extension of no more than 5 degrees and L knee flexion of no less than 110 degrees    Time 3   Period Weeks   Status Achieved   PT SHORT TERM GOAL #2   Title Patient will be able to ambulate unlimited distances with cane and improved gait including equal step lengths, increased stance time L LE, improved posture, improved gait speed, increased trunk and pelvic rotation    Time 3   Period Weeks   Status Achieved   PT SHORT TERM GOAL #3   Title Patient will be able to verbalize and correctly demonstrate appropriate strategies to assist in home management for reducing edema L knee    Time 3   Period Weeks   Status Achieved   PT SHORT TERM GOAL #4   Title Patient will be independent in correctly and consistently performing appropriate HEP, to be updated PRN    Time 3   Period Weeks   Status Achieved           PT Long Term Goals - 10/16/14 1058    PT LONG TERM GOAL #1   Title Patient will demonstrate L knee extension of 0 degrees and L knee flexion of at least 120 degrees    Time 6   Period Weeks   Status On-going   PT LONG TERM GOAL #2   Title Patient will demonstrate 5/5 strength in bilateral lower extremities and at least 4+/5 strength in proximal musculature    Time 6   Period Weeks   Status On-going   PT LONG TERM GOAL #3   Title Patient will be able to  ambulate unlimited distances over even and uneven surfaces with no assistive device and pain 0/10 L knee    Time 6   Period Weeks   Status On-going   PT LONG TERM GOAL #4   Title Patient will be able to reciprocally ascend and descend full flight of stairs with no railings, step over step pattern with no circumduction, good eccentric control, and pain L knee 0/10   Time 6  Period Weeks   Status On-going               Plan - 10/23/14 4045    Clinical Impression Statement Pt making progress in functional strength and isolated strengthening, as well as ROM improvements. Pt continues to show difficulty in improving activity tolerance, with most incrases in walking and exercise followed by swelling and pain, but pt is impoving in  self management of pain and sweling.    Pt will benefit from skilled therapeutic intervention in order to improve on the following deficits Abnormal gait;Decreased skin integrity;Hypomobility;Decreased scar mobility;Increased edema;Decreased strength;Pain;Decreased mobility;Difficulty walking;Decreased range of motion;Decreased coordination;Impaired flexibility   Rehab Potential Good   PT Duration 6 weeks   PT Treatment/Interventions ADLs/Self Care Home Management;Cryotherapy;DME Instruction;Gait training;Stair training;Functional mobility training;Therapeutic activities;Therapeutic exercise;Balance training;Neuromuscular re-education;Patient/family education;Manual techniques   PT Next Visit Plan Continue to progress functional stretching and exercises for knee mobility and strength as tolerated, manual PRN    PT Home Exercise Plan Updated, added self scar massage of surgical scar.    Consulted and Agree with Plan of Care Patient        Problem List Patient Active Problem List   Diagnosis Date Noted  . S/P total knee arthroplasty 09/17/2014  . Leg pain 10/14/2011    Hieu Herms C 10/23/2014, 11:38 AM  11:38 AM  Etta Grandchild, PT, DPT [place] License #  [id number]       [facility] [address] [place], [place], [address] Phone: [phone]   Fax:  [phone]

## 2014-10-23 NOTE — Patient Instructions (Signed)
Continue to progress walking, but plan on additional time thereafter for icing and elevation.

## 2014-10-25 ENCOUNTER — Ambulatory Visit (HOSPITAL_COMMUNITY): Payer: Commercial Managed Care - HMO | Admitting: Physical Therapy

## 2014-10-25 ENCOUNTER — Telehealth (HOSPITAL_COMMUNITY): Payer: Self-pay

## 2014-10-25 DIAGNOSIS — M25562 Pain in left knee: Secondary | ICD-10-CM | POA: Diagnosis not present

## 2014-10-25 DIAGNOSIS — R609 Edema, unspecified: Secondary | ICD-10-CM | POA: Diagnosis not present

## 2014-10-25 DIAGNOSIS — R262 Difficulty in walking, not elsewhere classified: Secondary | ICD-10-CM | POA: Diagnosis not present

## 2014-10-25 DIAGNOSIS — M25662 Stiffness of left knee, not elsewhere classified: Secondary | ICD-10-CM | POA: Diagnosis not present

## 2014-10-25 DIAGNOSIS — Z96652 Presence of left artificial knee joint: Secondary | ICD-10-CM

## 2014-10-25 DIAGNOSIS — R29898 Other symptoms and signs involving the musculoskeletal system: Secondary | ICD-10-CM | POA: Diagnosis not present

## 2014-10-25 DIAGNOSIS — M6289 Other specified disorders of muscle: Secondary | ICD-10-CM | POA: Diagnosis not present

## 2014-10-25 NOTE — Telephone Encounter (Signed)
Patient only want 2 appt per week.

## 2014-10-25 NOTE — Therapy (Signed)
[place], [place], [address] Phone: [phone]   Fax:  [phone]  Physical Therapy Treatment  Patient Details  Name: Harold Nicholson MRN: [id number] Date of Birth: 11/30/1950 Referring Provider:  Vickey Huger, MD  Encounter Date: 10/25/2014      PT End of Session - 10/25/14 1012    Visit Number 7   Number of Visits 18   Date for PT Re-Evaluation 10/30/14   Authorization Type [employer]    Authorization Time Period 10/09/14 to 12/09/14   Authorization - Visit Number 7   Authorization - Number of Visits 10   PT Start Time 0936   PT Stop Time 1018   PT Time Calculation (min) 42 min      Past Medical History  Diagnosis Date  . Hypertension   . Hypercholesteremia   . Hiatal hernia   . Pure hyperglyceridemia   . Thyroid disease     Hypothyroid   . Impaired fasting glucose   . Arthritis   . Hypothyroidism   . Complication of anesthesia     pt stated he stopped breathing during surgery    Past Surgical History  Procedure Laterality Date  . Right knee      X 2  . Left knee arthroscopy    . Colonoscopy  10/07/2010    Procedure: COLONOSCOPY;  Surgeon: Jamesetta So;  [facility];  Service: Gastroenterology;  Laterality: N/A;  . Esophagogastroduodenoscopy  10/07/2010    Procedure: ESOPHAGOGASTRODUODENOSCOPY (EGD);  Surgeon: Jamesetta So;  [facility];  Service: Gastroenterology;  Laterality: N/A;  . Joint replacement      Right knee  . Cholecystectomy    . Cholecystectomy N/A 05/01/2013    Procedure: LAPAROSCOPIC CHOLECYSTECTOMY;  Surgeon: Jamesetta So, MD;  [facility];  Service: General;  Laterality: N/A;  . Liver biopsy N/A 05/01/2013    Procedure: LIVER BIOPSY;  Surgeon: Jamesetta So, MD;  [facility];  Service: General;  Laterality: N/A;  . Eye surgery    . Total knee arthroplasty Left 09/17/2014    Procedure: TOTAL KNEE ARTHROPLASTY;  Surgeon: Vickey Huger, MD;  Location: [place];  Service: Orthopedics;  Laterality: Left;    There were no vitals filed for this visit.  Visit Diagnosis:  Status post total left knee replacement      Subjective Assessment - 10/25/14 0937    Subjective Pt states that he was at church yesterday sitting more than usual and his knee really swelled up.  Pt states that he feels stiff today.    Pain Score 3    Pain Location Knee   Pain Orientation Left   Pain Descriptors / Indicators Aching                [facility] Adult PT Treatment/Exercise - 10/25/14 0001    Knee/Hip Exercises: Stretches   Active Hamstring Stretch 3 reps;30 seconds  MET, 5sx20s   Active Hamstring Stretch Limitations supine, hip at 90 degrees   Knee: Self-Stretch to increase Flexion Left  supine with sheet around foot; 15x3sec   Knee: Self-Stretch Limitations 15x3sec   Gastroc Stretch 3 reps;30 seconds;Left   Gastroc Stretch Limitations slant board    Knee/Hip Exercises: Standing   Heel Raises Both;10 reps  no UE    Knee Flexion Strengthening;Left;10 reps   Knee Flexion Limitations 4#   Forward Lunges Left;10 reps   Side Lunges Both;10 reps   Lateral  Step Up Left;10 reps;Step Height: 4"   Forward Step Up Left;10 reps   Functional Squat 10 reps   Rocker Board 2 minutes   SLS 5x   Knee/Hip Exercises: Supine   Quad Sets 15 reps;Left;AAROM;Strengthening   Straight Leg Raises AROM;Strengthening;Left;2 sets;15 reps  2#   Straight Leg Raises Limitations 2#   Knee Extension --  3   Knee Flexion --  125   Knee/Hip Exercises: Prone   Hamstring Curl 10 reps   Hamstring Curl Limitations 4#   Hip Extension 10 reps   Hip Extension Limitations 4#                  PT Short Term Goals - 10/25/14 1013    PT SHORT TERM GOAL #1   Title Patient will demonstate L knee extension of no more than 5 degrees and L knee flexion of no less than 110 degrees    Time 3   Period Weeks   Status Achieved   PT SHORT TERM GOAL #2    Title Patient will be able to ambulate unlimited distances with cane and improved gait including equal step lengths, increased stance time L LE, improved posture, improved gait speed, increased trunk and pelvic rotation    Time 3   Period Weeks   Status Achieved   PT SHORT TERM GOAL #3   Title Patient will be able to verbalize and correctly demonstrate appropriate strategies to assist in home management for reducing edema L knee    Time 3   Period Weeks   Status Achieved   PT SHORT TERM GOAL #4   Title Patient will be independent in correctly and consistently performing appropriate HEP, to be updated PRN    Time 3   Period Weeks   Status Achieved           PT Long Term Goals - 10/25/14 1014    PT LONG TERM GOAL #1   Title Patient will demonstrate L knee extension of 0 degrees and L knee flexion of at least 120 degrees    Time 6   Status Partially Met   PT LONG TERM GOAL #2   Title Patient will demonstrate 5/5 strength in bilateral lower extremities and at least 4+/5 strength in proximal musculature    Time 6   Status Partially Met   PT LONG TERM GOAL #3   Title Patient will be able to ambulate unlimited distances over even and uneven surfaces with no assistive device and pain 0/10 L knee    Time 6   Period Weeks   Status On-going   PT LONG TERM GOAL #4   Title Patient will be able to reciprocally ascend and descend full flight of stairs with no railings, step over step pattern with no circumduction, good eccentric control, and pain L knee 0/10   Time 6   Period Weeks   Status On-going               Plan - 10/25/14 1016    Clinical Impression Statement Pt progressing well in ROM.  Pt continues to have decreased gluteal strength and balance with therapist facilitation needed for proper form with exercises that focus on these deficits for correct form.     PT Next Visit Plan begin sit to stand and stair climbing activities.         Problem List Patient Active  Problem List   Diagnosis Date Noted  . S/P total knee arthroplasty 09/17/2014  .  Leg pain 10/14/2011   Rayetta Humphrey, PT CLT [phone] 10/25/2014, 10:20 AM  [place], [place], [address] Phone: [phone]   Fax:  [phone]

## 2014-10-29 ENCOUNTER — Ambulatory Visit (HOSPITAL_COMMUNITY): Payer: Commercial Managed Care - HMO | Admitting: Physical Therapy

## 2014-10-31 ENCOUNTER — Ambulatory Visit (HOSPITAL_COMMUNITY): Payer: Commercial Managed Care - HMO | Admitting: Physical Therapy

## 2014-10-31 DIAGNOSIS — Z96652 Presence of left artificial knee joint: Secondary | ICD-10-CM

## 2014-10-31 DIAGNOSIS — M25662 Stiffness of left knee, not elsewhere classified: Secondary | ICD-10-CM | POA: Diagnosis not present

## 2014-10-31 DIAGNOSIS — R29898 Other symptoms and signs involving the musculoskeletal system: Secondary | ICD-10-CM | POA: Diagnosis not present

## 2014-10-31 DIAGNOSIS — R609 Edema, unspecified: Secondary | ICD-10-CM | POA: Diagnosis not present

## 2014-10-31 DIAGNOSIS — R262 Difficulty in walking, not elsewhere classified: Secondary | ICD-10-CM | POA: Diagnosis not present

## 2014-10-31 DIAGNOSIS — M6289 Other specified disorders of muscle: Secondary | ICD-10-CM | POA: Diagnosis not present

## 2014-10-31 DIAGNOSIS — M25562 Pain in left knee: Secondary | ICD-10-CM

## 2014-10-31 NOTE — Therapy (Signed)
[place], [place], [address] Phone: [phone]   Fax:  [phone]  Physical Therapy Treatment  Patient Details  Name: Harold Nicholson MRN: [id number] Date of Birth: 1950/08/23 Referring Provider:  Vickey Huger, MD  Encounter Date: 10/31/2014      PT End of Session - 10/31/14 1015    Visit Number 8   Number of Visits 18   Date for PT Re-Evaluation 10/30/14   Authorization Type [employer]    Authorization Time Period 10/09/14 to 12/09/14   Authorization - Visit Number 8   Authorization - Number of Visits 10   PT Start Time 0931   PT Stop Time 1017   PT Time Calculation (min) 46 min   Activity Tolerance Patient tolerated treatment well   Behavior During Therapy [facility] for tasks assessed/performed      Past Medical History  Diagnosis Date  . Hypertension   . Hypercholesteremia   . Hiatal hernia   . Pure hyperglyceridemia   . Thyroid disease     Hypothyroid   . Impaired fasting glucose   . Arthritis   . Hypothyroidism   . Complication of anesthesia     pt stated he stopped breathing during surgery    Past Surgical History  Procedure Laterality Date  . Right knee      X 2  . Left knee arthroscopy    . Colonoscopy  10/07/2010    Procedure: COLONOSCOPY;  Surgeon: Jamesetta So;  [facility];  Service: Gastroenterology;  Laterality: N/A;  . Esophagogastroduodenoscopy  10/07/2010    Procedure: ESOPHAGOGASTRODUODENOSCOPY (EGD);  Surgeon: Jamesetta So;  [facility];  Service: Gastroenterology;  Laterality: N/A;  . Joint replacement      Right knee  . Cholecystectomy    . Cholecystectomy N/A 05/01/2013    Procedure: LAPAROSCOPIC CHOLECYSTECTOMY;  Surgeon: Jamesetta So, MD;  [facility];  Service: General;  Laterality: N/A;  . Liver biopsy N/A 05/01/2013    Procedure: LIVER BIOPSY;  Surgeon: Jamesetta So, MD;  [facility];  Service: General;  Laterality: N/A;  .  Eye surgery    . Total knee arthroplasty Left 09/17/2014    Procedure: TOTAL KNEE ARTHROPLASTY;  Surgeon: Vickey Huger, MD;  Location: [place];  Service: Orthopedics;  Laterality: Left;    There were no vitals filed for this visit.  Visit Diagnosis:  Status post total left knee replacement  Left knee pain  Difficulty walking  Knee stiffness, left      Subjective Assessment - 10/31/14 0932    Subjective Pt reports that he walked a little bit farther than he normally does yesterday, and his knee is aching a little more today.    Currently in Pain? Yes   Pain Score 4    Pain Location Knee   Pain Orientation Left                         [facility] Adult PT Treatment/Exercise - 10/31/14 0001    Ambulation/Gait   Ambulation/Gait Yes   Ambulation/Gait Assistance 6: Modified independent (Device/Increase time)   Ambulation Distance (Feet) 225 Feet   Stairs Yes   Stairs Assistance 5: Supervision   Stair Management Technique No rails;One rail Right   Number of Stairs 8  4" x 2 RT, 7" x 2 RT   Height of Stairs 4  4" x 2 RT, 7" x 2 RT  Knee/Hip Exercises: Stretches   Active Hamstring Stretch 3 reps;30 seconds  MET, 5sx20s   Active Hamstring Stretch Limitations 12" step   Knee: Self-Stretch to increase Flexion Left;10 seconds   Knee: Self-Stretch Limitations 10 reps onto 12" step   Gastroc Stretch 3 reps;30 seconds;Left   Gastroc Stretch Limitations slant board    Knee/Hip Exercises: Aerobic   Nustep hills 3 level 4 x 8'   Knee/Hip Exercises: Standing   Heel Raises 15 reps  no UE   Terminal Knee Extension Limitations 15 reps LLE  green tband   Lateral Step Up Left;10 reps;Step Height: 6"   Forward Step Up Left;10 reps;Step Height: 6"   Functional Squat 10 reps   Functional Squat Limitations at mat table   Rocker Board 2 minutes   SLS 5x  6" max on LLE   Other Standing Knee Exercises tap ups at 6" step x 10   Knee/Hip Exercises: Seated   Sit to Sand 10  reps;without UE support                  PT Short Term Goals - 10/25/14 1013    PT SHORT TERM GOAL #1   Title Patient will demonstate L knee extension of no more than 5 degrees and L knee flexion of no less than 110 degrees    Time 3   Period Weeks   Status Achieved   PT SHORT TERM GOAL #2   Title Patient will be able to ambulate unlimited distances with cane and improved gait including equal step lengths, increased stance time L LE, improved posture, improved gait speed, increased trunk and pelvic rotation    Time 3   Period Weeks   Status Achieved   PT SHORT TERM GOAL #3   Title Patient will be able to verbalize and correctly demonstrate appropriate strategies to assist in home management for reducing edema L knee    Time 3   Period Weeks   Status Achieved   PT SHORT TERM GOAL #4   Title Patient will be independent in correctly and consistently performing appropriate HEP, to be updated PRN    Time 3   Period Weeks   Status Achieved           PT Long Term Goals - 10/25/14 1014    PT LONG TERM GOAL #1   Title Patient will demonstrate L knee extension of 0 degrees and L knee flexion of at least 120 degrees    Time 6   Status Partially Met   PT LONG TERM GOAL #2   Title Patient will demonstrate 5/5 strength in bilateral lower extremities and at least 4+/5 strength in proximal musculature    Time 6   Status Partially Met   PT LONG TERM GOAL #3   Title Patient will be able to ambulate unlimited distances over even and uneven surfaces with no assistive device and pain 0/10 L knee    Time 6   Period Weeks   Status On-going   PT LONG TERM GOAL #4   Title Patient will be able to reciprocally ascend and descend full flight of stairs with no railings, step over step pattern with no circumduction, good eccentric control, and pain L knee 0/10   Time 6   Period Weeks   Status On-going               Plan - 10/31/14 1210    Clinical Impression Statement  Treatment focused on improving ROM and  knee strength. Step ups and sit to stands were added to treatment today to improve quad and glut strength, and tap ups at 6" step were added to improve SLS. Stair training was completed on both 4" and 7" stairs, and pt required verbal cueing to avoid ER of bilateral feet during training.    PT Next Visit Plan Continue with quad and glut strengthening, manual therapy to scar tissue. Reassess next session        Problem List Patient Active Problem List   Diagnosis Date Noted  . S/P total knee arthroplasty 09/17/2014  . Leg pain 10/14/2011    Hilma Favors, PT, DPT [phone] 10/31/2014, 12:14 PM  [place] [address] [place], [place], [address] Phone: [phone]   Fax:  [phone]

## 2014-11-02 ENCOUNTER — Ambulatory Visit (HOSPITAL_COMMUNITY): Payer: Commercial Managed Care - HMO | Admitting: Physical Therapy

## 2014-11-02 DIAGNOSIS — R29898 Other symptoms and signs involving the musculoskeletal system: Secondary | ICD-10-CM | POA: Diagnosis not present

## 2014-11-02 DIAGNOSIS — M6289 Other specified disorders of muscle: Secondary | ICD-10-CM | POA: Diagnosis not present

## 2014-11-02 DIAGNOSIS — M25662 Stiffness of left knee, not elsewhere classified: Secondary | ICD-10-CM | POA: Diagnosis not present

## 2014-11-02 DIAGNOSIS — R262 Difficulty in walking, not elsewhere classified: Secondary | ICD-10-CM

## 2014-11-02 DIAGNOSIS — M25562 Pain in left knee: Secondary | ICD-10-CM | POA: Diagnosis not present

## 2014-11-02 DIAGNOSIS — R609 Edema, unspecified: Secondary | ICD-10-CM | POA: Diagnosis not present

## 2014-11-02 DIAGNOSIS — Z96652 Presence of left artificial knee joint: Secondary | ICD-10-CM

## 2014-11-02 NOTE — Therapy (Signed)
[place], [place], [address] Phone: [phone]   Fax:  [phone]  Physical Therapy Treatment  Patient Details  Name: Harold Nicholson MRN: [id number] Date of Birth: [birth year] Referring Provider:  Vickey Huger, MD  Encounter Date: 11/02/2014      PT End of Session - 11/02/14 1047    Visit Number 9   Number of Visits 18   Date for PT Re-Evaluation 11/30/14   Authorization Type [employer]    Authorization Time Period 10/09/14 to 12/09/14- gcodes done 9th visit   Authorization - Visit Number 9   Authorization - Number of Visits 19   PT Start Time 0930   PT Stop Time 1018   PT Time Calculation (min) 48 min   Activity Tolerance Patient tolerated treatment well   Behavior During Therapy [facility] for tasks assessed/performed      Past Medical History  Diagnosis Date  . Hypertension   . Hypercholesteremia   . Hiatal hernia   . Pure hyperglyceridemia   . Thyroid disease     Hypothyroid   . Impaired fasting glucose   . Arthritis   . Hypothyroidism   . Complication of anesthesia     pt stated he stopped breathing during surgery    Past Surgical History  Procedure Laterality Date  . Right knee      X 2  . Left knee arthroscopy    . Colonoscopy  10/07/2010    Procedure: COLONOSCOPY;  Surgeon: Jamesetta So;  [facility];  Service: Gastroenterology;  Laterality: N/A;  . Esophagogastroduodenoscopy  10/07/2010    Procedure: ESOPHAGOGASTRODUODENOSCOPY (EGD);  Surgeon: Jamesetta So;  [facility];  Service: Gastroenterology;  Laterality: N/A;  . Joint replacement      Right knee  . Cholecystectomy    . Cholecystectomy N/A 05/01/2013    Procedure: LAPAROSCOPIC CHOLECYSTECTOMY;  Surgeon: Jamesetta So, MD;  [facility]: [facility];  Service: General;  Laterality: N/A;  . Liver biopsy N/A 05/01/2013    Procedure: LIVER BIOPSY;  Surgeon: Jamesetta So, MD;  Location: [facility];  Service: General;   Laterality: N/A;  . Eye surgery    . Total knee arthroplasty Left 09/17/2014    Procedure: TOTAL KNEE ARTHROPLASTY;  Surgeon: Vickey Huger, MD;  Location: [facility];  Service: Orthopedics;  Laterality: Left;    There were no vitals filed for this visit.  Visit Diagnosis:  Status post total left knee replacement  Left knee pain  Difficulty walking  Knee stiffness, left  Leg weakness, bilateral      Subjective Assessment - 11/02/14 0939    Subjective Pt reports that he has noticed improvements in his strength, walking, and ROM since beginning PT. He is still having difficulty with ascending/descending stairs and with his balance.    How long can you sit comfortably? 9/16- no limitations   How long can you stand comfortably? 9/16- 15-20 minutes   How long can you walk comfortably? 9/16- 30 minutes   Currently in Pain? No/denies   Pain Score 0-No pain            [facility] PT Assessment - 11/02/14 0001    Observation/Other Assessments   Focus on Therapeutic Outcomes (FOTO)  29% limited   Functional Tests   Functional tests Single leg stance   Single Leg Stance   Comments 6" max   AROM   Left Knee Extension 0   Left Knee Flexion 125  Strength   Right/Left Hip Left   Right Hip Flexion 5/5   Right Hip Extension 4-/5   Right Hip ABduction 4+/5   Left Hip Flexion 4+/5   Left Hip Extension 4-/5   Left Hip ABduction 4+/5   Right Knee Flexion 5/5   Right Knee Extension 5/5   Left Knee Flexion 4+/5   Left Knee Extension 5/5   Transfers   Five time sit to stand comments  15.63   Ambulation/Gait   Ambulation/Gait Yes   Ambulation/Gait Assistance 7: Independent   Ambulation Distance (Feet) 225 Feet   Stairs Yes   Stairs Assistance 7: Independent   Stair Management Technique One rail Left;Alternating pattern   Number of Stairs 12  4x3RT   Height of Stairs 7                     [facility] Adult PT Treatment/Exercise - 11/02/14 0001    Knee/Hip Exercises: Stretches    Active Hamstring Stretch 3 reps;30 seconds  MET, 5sx20s   Active Hamstring Stretch Limitations 12" step   Knee: Self-Stretch to increase Flexion Left;10 seconds   Knee: Self-Stretch Limitations 10 reps onto 12" step   Gastroc Stretch 3 reps;30 seconds;Left   Gastroc Stretch Limitations slant board    Knee/Hip Exercises: Standing   Other Standing Knee Exercises tap ups at 6" step x 10                  PT Short Term Goals - 11/02/14 0959    PT SHORT TERM GOAL #1   Title Patient will demonstate L knee extension of no more than 5 degrees and L knee flexion of no less than 110 degrees    Time 3   Period Weeks   Status Achieved   PT SHORT TERM GOAL #2   Title Patient will be able to ambulate unlimited distances with cane and improved gait including equal step lengths, increased stance time L LE, improved posture, improved gait speed, increased trunk and pelvic rotation    Time 3   Period Weeks   Status Achieved   PT SHORT TERM GOAL #3   Title Patient will be able to verbalize and correctly demonstrate appropriate strategies to assist in home management for reducing edema L knee    Time 3   Period Weeks   Status Achieved   PT SHORT TERM GOAL #4   Title Patient will be independent in correctly and consistently performing appropriate HEP, to be updated PRN    Time 3   Period Weeks   Status Achieved           PT Long Term Goals - 11/02/14 1000    PT LONG TERM GOAL #1   Title Patient will demonstrate L knee extension of 0 degrees and L knee flexion of at least 120 degrees    Time 6   Period Weeks   Status Achieved   PT LONG TERM GOAL #2   Title Patient will demonstrate 5/5 strength in bilateral lower extremities and at least 4+/5 strength in proximal musculature    Time 6   Period Weeks   Status On-going   PT LONG TERM GOAL #3   Title Patient will be able to ambulate unlimited distances over even and uneven surfaces with no assistive device and pain 0/10 L knee     Time 6   Period Weeks   Status Achieved   PT LONG TERM GOAL #4   Title Patient  will be able to reciprocally ascend and descend full flight of stairs with no railings, step over step pattern with no circumduction, good eccentric control, and pain L knee 0/10   Time 6   Period Weeks   Status On-going               Plan - 2014-11-15 1050    Clinical Impression Statement Reassessment was completed today. Pt has made steady progress in regards to strength, ROM, gait, functional mobility, and balance. He continues to demonstrate weakness in hip extensors bilaterally, and also demonstrates decreased balance evidenced by SLS time of 6" on LLE. It was discussed with pt that he could be d/c at this time if he felt comfortable continuing independently. Pt reports that he would like to attend his follow up appt with his MD next week before being discharged. Pt will benefit from continuation of skilled services for 2-4 more sessions to further address his balance, hip strength, and ability to ascend/descend stairs without UE support.    Pt will benefit from skilled therapeutic intervention in order to improve on the following deficits Abnormal gait;Decreased skin integrity;Hypomobility;Decreased scar mobility;Increased edema;Decreased strength;Pain;Decreased mobility;Difficulty walking;Decreased range of motion;Decreased coordination;Impaired flexibility   PT Frequency 2x / week   PT Duration 2 weeks  1-2 weeks   PT Treatment/Interventions ADLs/Self Care Home Management;Cryotherapy;DME Instruction;Gait training;Stair training;Functional mobility training;Therapeutic activities;Therapeutic exercise;Balance training;Neuromuscular re-education;Patient/family education;Manual techniques   PT Next Visit Plan Continue with glut strengthening, stair training, and balance training.           G-Codes - 15-Nov-2014 1053    Functional Assessment Tool Used FOTO 29% limited   Mobility: Walking and Moving Around  Current Status [phone]) At least 20 percent but less than 40 percent impaired, limited or restricted   Mobility: Walking and Moving Around Goal Status [phone]) At least 20 percent but less than 40 percent impaired, limited or restricted      Problem List Patient Active Problem List   Diagnosis Date Noted  . S/P total knee arthroplasty 09/17/2014  . Leg pain 10/14/2011     Physical Therapy Progress Note  Dates of Reporting Period: 10/09/14 to 11/15/2014  Objective Reports of Subjective Statement: Pt demonstrates increased LE strength, improved gait mechanics, and improve functional mobility. He continues to demonstrate gluteal weakness bilaterally, has difficulty with stair negotiation, and demonstrates impaired balance.    Objective Measurements: see above  Goal Update: see above  Plan: Continue with hip strengthening, balance training, and stair training for 2-4 more sessions, then d/c to HEP.   Reason Skilled Services are Required: Pt will benefit from continued skilled services to improve gait mechanics and ability to negotiate stairs, decrease fall risk, and improve LE strength in order to return pt to PLOF.      Hilma Favors, PT, DPT [phone] Nov 15, 2014, 10:55 AM  [place], [place], [address] Phone: [phone]   Fax:  [phone]

## 2014-11-05 ENCOUNTER — Encounter (HOSPITAL_COMMUNITY): Payer: Medicare HMO | Admitting: Physical Therapy

## 2014-11-06 NOTE — Therapy (Addendum)
[place], [place], [address] Phone: [phone]   Fax:  [phone]  Patient Details  Name: Harold Nicholson MRN: [id number] Date of Birth: 26-Jul-1950 Referring Provider:  Vickey Huger, MD  Encounter Date: 11/06/2014   PHYSICAL THERAPY DISCHARGE SUMMARY  Visits from Start of Care: 9  Current functional level related to goals / functional outcomes: Per most recent re-assessment, continues to have difficulty with balance and stair navigation. However MD released patient from PT at most recent follow-up.   Remaining deficits: Balance, muscular strength, stair navigation   Education / Equipment: Advised of progress with PT at latest re-assess  Plan: Patient agrees to discharge.  Patient goals were partially met. Patient is being discharged due to the physician's request.  ?????       Deniece Ree PT, DPT [place], [place], [address] Phone: [phone]   Fax:  [phone]

## 2014-11-07 ENCOUNTER — Encounter (HOSPITAL_COMMUNITY): Payer: Medicare HMO | Admitting: Physical Therapy

## 2014-11-08 DIAGNOSIS — N183 Chronic kidney disease, stage 3 (moderate): Secondary | ICD-10-CM | POA: Diagnosis not present

## 2014-11-09 ENCOUNTER — Encounter (HOSPITAL_COMMUNITY): Payer: Medicare HMO | Admitting: Physical Therapy

## 2014-11-12 ENCOUNTER — Encounter (HOSPITAL_COMMUNITY): Payer: Medicare HMO | Admitting: Physical Therapy

## 2014-11-12 DIAGNOSIS — I10 Essential (primary) hypertension: Secondary | ICD-10-CM | POA: Diagnosis not present

## 2014-11-12 DIAGNOSIS — N183 Chronic kidney disease, stage 3 (moderate): Secondary | ICD-10-CM | POA: Diagnosis not present

## 2014-11-14 ENCOUNTER — Encounter (HOSPITAL_COMMUNITY): Payer: Medicare HMO | Admitting: Physical Therapy

## 2014-11-16 ENCOUNTER — Encounter (HOSPITAL_COMMUNITY): Payer: Medicare HMO | Admitting: Physical Therapy

## 2014-11-19 ENCOUNTER — Encounter (HOSPITAL_COMMUNITY): Payer: Medicare HMO | Admitting: Physical Therapy

## 2014-11-21 ENCOUNTER — Encounter (HOSPITAL_COMMUNITY): Payer: Medicare HMO | Admitting: Physical Therapy

## 2014-11-23 ENCOUNTER — Encounter (HOSPITAL_COMMUNITY): Payer: Medicare HMO

## 2014-11-26 ENCOUNTER — Encounter (HOSPITAL_COMMUNITY): Payer: Medicare HMO | Admitting: Physical Therapy

## 2014-11-28 ENCOUNTER — Encounter (HOSPITAL_COMMUNITY): Payer: Medicare HMO | Admitting: Physical Therapy

## 2014-11-30 ENCOUNTER — Encounter (HOSPITAL_COMMUNITY): Payer: Medicare HMO

## 2015-01-07 DIAGNOSIS — E039 Hypothyroidism, unspecified: Secondary | ICD-10-CM | POA: Diagnosis not present

## 2015-01-07 DIAGNOSIS — Z23 Encounter for immunization: Secondary | ICD-10-CM | POA: Diagnosis not present

## 2015-01-07 DIAGNOSIS — I10 Essential (primary) hypertension: Secondary | ICD-10-CM | POA: Diagnosis not present

## 2015-01-07 DIAGNOSIS — R7309 Other abnormal glucose: Secondary | ICD-10-CM | POA: Diagnosis not present

## 2015-01-07 DIAGNOSIS — E782 Mixed hyperlipidemia: Secondary | ICD-10-CM | POA: Diagnosis not present

## 2015-01-07 DIAGNOSIS — Z1389 Encounter for screening for other disorder: Secondary | ICD-10-CM | POA: Diagnosis not present

## 2015-01-08 DIAGNOSIS — Z96652 Presence of left artificial knee joint: Secondary | ICD-10-CM | POA: Diagnosis not present

## 2015-02-01 DIAGNOSIS — E784 Other hyperlipidemia: Secondary | ICD-10-CM | POA: Diagnosis not present

## 2015-02-01 DIAGNOSIS — Z6835 Body mass index (BMI) 35.0-35.9, adult: Secondary | ICD-10-CM | POA: Diagnosis not present

## 2015-02-01 DIAGNOSIS — I10 Essential (primary) hypertension: Secondary | ICD-10-CM | POA: Diagnosis not present

## 2015-02-01 DIAGNOSIS — Z0000 Encounter for general adult medical examination without abnormal findings: Secondary | ICD-10-CM | POA: Diagnosis not present

## 2015-02-01 DIAGNOSIS — R001 Bradycardia, unspecified: Secondary | ICD-10-CM | POA: Diagnosis not present

## 2015-03-12 ENCOUNTER — Other Ambulatory Visit: Payer: Self-pay | Admitting: Orthopedic Surgery

## 2015-03-12 DIAGNOSIS — M25511 Pain in right shoulder: Secondary | ICD-10-CM | POA: Insufficient documentation

## 2015-03-19 ENCOUNTER — Ambulatory Visit
Admission: RE | Admit: 2015-03-19 | Discharge: 2015-03-19 | Disposition: A | Discharge status: Home / Self Care | Payer: PPO | Source: Ambulatory Visit | Attending: Orthopedic Surgery | Admitting: Orthopedic Surgery

## 2015-03-19 DIAGNOSIS — M25511 Pain in right shoulder: Secondary | ICD-10-CM

## 2015-03-26 DIAGNOSIS — M75121 Complete rotator cuff tear or rupture of right shoulder, not specified as traumatic: Secondary | ICD-10-CM | POA: Insufficient documentation

## 2015-05-30 DIAGNOSIS — I10 Essential (primary) hypertension: Secondary | ICD-10-CM | POA: Insufficient documentation

## 2015-05-30 DIAGNOSIS — E039 Hypothyroidism, unspecified: Secondary | ICD-10-CM | POA: Insufficient documentation

## 2015-05-30 DIAGNOSIS — N189 Chronic kidney disease, unspecified: Secondary | ICD-10-CM | POA: Insufficient documentation

## 2015-05-30 DIAGNOSIS — K219 Gastro-esophageal reflux disease without esophagitis: Secondary | ICD-10-CM | POA: Insufficient documentation

## 2015-05-30 DIAGNOSIS — J302 Other seasonal allergic rhinitis: Secondary | ICD-10-CM | POA: Insufficient documentation

## 2015-07-19 ENCOUNTER — Encounter (HOSPITAL_COMMUNITY): Payer: Self-pay | Admitting: Occupational Therapy

## 2015-07-19 ENCOUNTER — Ambulatory Visit (HOSPITAL_COMMUNITY): Payer: PPO | Attending: Orthopedic Surgery | Admitting: Occupational Therapy

## 2015-07-19 DIAGNOSIS — M25511 Pain in right shoulder: Secondary | ICD-10-CM | POA: Insufficient documentation

## 2015-07-19 DIAGNOSIS — Z9889 Other specified postprocedural states: Secondary | ICD-10-CM | POA: Insufficient documentation

## 2015-07-19 DIAGNOSIS — R29898 Other symptoms and signs involving the musculoskeletal system: Secondary | ICD-10-CM | POA: Diagnosis present

## 2015-07-19 NOTE — Patient Instructions (Addendum)
SHOULDER: Flexion On Table   Place hands on table, elbows straight. Move hips away from body. Press hands down into table.  _10-15__ reps per set, _1-2__ sets per day  Abduction (Passive)   With arm out to side, resting on table, lower head toward arm, keeping trunk away from table.  Repeat _10-15___ times. Do _1-2___ sessions per day.  Copyright  VHI. All rights reserved.     Internal Rotation (Assistive)   Seated with elbow bent at right angle and held against side, slide arm on table surface in an inward arc. Repeat __10-15__ times. Do _1-2___ sessions per day. Activity: Use this motion to brush crumbs off the table.  Copyright  VHI. All rights reserved.    

## 2015-07-19 NOTE — Therapy (Addendum)
[place], [place], [address] Phone: [phone]   Fax:  [phone]  Occupational Therapy Evaluation  Patient Details  Name: Harold Nicholson MRN: [id number] Date of Birth: 02-Jul-1950 Referring Provider: Dr. Charlotte Sanes  Encounter Date: 07/19/2015      OT End of Session - 07/19/15 1435    Visit Number 1   Number of Visits 16   Date for OT Re-Evaluation 09/17/15  Mini reassessment 08/17/15   Authorization Type [employer]   Authorization Time Period Before 10th visit   Authorization - Visit Number 1   Authorization - Number of Visits 10   OT Start Time 1343   OT Stop Time 1420   OT Time Calculation (min) 37 min   Activity Tolerance Patient tolerated treatment well   Behavior During Therapy [facility] for tasks assessed/performed      Past Medical History  Diagnosis Date  . Hypertension   . Hypercholesteremia   . Hiatal hernia   . Pure hyperglyceridemia   . Thyroid disease     Hypothyroid   . Impaired fasting glucose   . Arthritis   . Hypothyroidism   . Complication of anesthesia     pt stated he stopped breathing during surgery    Past Surgical History  Procedure Laterality Date  . Right knee      X 2  . Left knee arthroscopy    . Colonoscopy  10/07/2010    Procedure: COLONOSCOPY;  Surgeon: Jamesetta So;  [facility];  Service: Gastroenterology;  Laterality: N/A;  . Esophagogastroduodenoscopy  10/07/2010    Procedure: ESOPHAGOGASTRODUODENOSCOPY (EGD);  Surgeon: Jamesetta So;  [facility];  Service: Gastroenterology;  Laterality: N/A;  . Joint replacement      Right knee  . Cholecystectomy    . Cholecystectomy N/A 05/01/2013    Procedure: LAPAROSCOPIC CHOLECYSTECTOMY;  Surgeon: Jamesetta So, MD;  [facility];  Service: General;  Laterality: N/A;  . Liver biopsy N/A 05/01/2013    Procedure: LIVER BIOPSY;  Surgeon: Jamesetta So, MD;  [facility];   Service: General;  Laterality: N/A;  . Eye surgery    . Total knee arthroplasty Left 09/17/2014    Procedure: TOTAL KNEE ARTHROPLASTY;  Surgeon: Vickey Huger, MD;  Location: [place];  Service: Orthopedics;  Laterality: Left;    There were no vitals filed for this visit.      Subjective Assessment - 07/19/15 1436    Subjective  S: The doctor told me not to move my arm at all.    Pertinent History Pt is a 65 y/o male s/p right rotator cuff repair on 06/11/15. Pt reports he has been taking the sling off at times and propping his arm up on pillows, otherwise wears the sling all the time. Pt was referred to occupational therapy for evaluation and treatment by Dr. Frederik Pear Tuohy.    Special Tests FOTO Score: 25/100 (75% impairment)   Patient Stated Goals To be able to use my arm.           [facility] OT Assessment - 07/19/15 1345    Assessment   Diagnosis Right RCR   Referring Provider Dr. Harrell Gave Tuohy   Onset Date 06/11/15   Prior Therapy PT for TKA in 2016   Precautions   Precautions Shoulder   Type of Shoulder Precautions Begin P/ROM at 6 weeks post-op: 6/6-flexion to 140, ER to 40.    Shoulder Interventions Shoulder  sling/immobilizer;At all times   Restrictions   Weight Bearing Restrictions Yes   RUE Weight Bearing Non weight bearing   Balance Screen   Has the patient fallen in the past 6 months No   Has the patient had a decrease in activity level because of a fear of falling?  No   Is the patient reluctant to leave their home because of a fear of falling?  No   Home  Environment   Family/patient expects to be discharged to: Private residence   Living Arrangements Spouse/significant other   Prior Function   Level of [place] with basic ADLs   Vocation On disability   Leisure Pt enjoys yardwork, gardening, trimming trees   ADL   ADL comments Pt is having difficulty with all ADL tasks including dressing, bathing, reaching overhead, lifting weighted objects,  sleeping.    Written Expression   Dominant Hand Right   Cognition   Overall Cognitive Status Within Functional Limits for tasks assessed   Observation/Other Assessments   Focus on Therapeutic Outcomes (FOTO)  25/100 (75% impairment)   ROM / Strength   AROM / PROM / Strength PROM;Strength;AROM   PROM   Overall PROM Comments Assessed supine, ER/IR adducted   PROM Assessment Site Shoulder   Right/Left Shoulder Right   Right Shoulder Flexion 65 Degrees   Right Shoulder ABduction 78 Degrees   Right Shoulder Internal Rotation 90 Degrees   Right Shoulder External Rotation 41 Degrees   Strength   Overall Strength Unable to assess;Due to precautions                         OT Education - 07/19/15 1410    Education provided Yes   Education Details table slides   Person(s) Educated Patient   Methods Explanation;Demonstration;Handout   Comprehension Verbalized understanding;Returned demonstration          OT Short Term Goals - 07/19/15 1501    OT SHORT TERM GOAL #1   Title Pt will be educated on HEP.    Time 4   Period Weeks   Status New   OT SHORT TERM GOAL #2   Title Pt will decrease pain to 5/10 or less in RUE to increase ability to perform daily tasks.    Time 4   Period Weeks   Status New   OT SHORT TERM GOAL #3   Title Pt will decrease fascial restrictions from mod to min amounts or less in the RUE to increase mobility required for daily task completion.    Time 4   Period Weeks   Status New   OT SHORT TERM GOAL #4   Title Pt will increase P/ROM to [facility] in RUE to increase participation in dressing tasks.    Time 4   Period Weeks   Status New   OT SHORT TERM GOAL #5   Title Pt will increase RUE strength to 3/5 to increase ability to perform grooming tasks.    Time 4   Period Weeks   Status New           OT Long Term Goals - 07/19/15 1504    OT LONG TERM GOAL #1   Title Pt will return to prior level of functioning and independence in daily  and leisure tasks using RUE as dominant.    Time 8   Period Weeks   Status New   OT LONG TERM GOAL #2   Title Pt will decrease pain  in the RUE to 2/10 or less to increase ability to use RUE as dominant during daily tasks.    Time 8   Period Weeks   Status New   OT LONG TERM GOAL #3   Title Pt will decrease fascial restrictions in RUE from min to trace amounts or less to improve flexibility required for daily and leisure tasks.    Time 8   Period Weeks   Status New   OT LONG TERM GOAL #4   Title Pt will increase A/ROM to [facility] in RUE to improve ability to reach into overhead cabinets during daily tasks.    Time 8   Period Weeks   Status New   OT LONG TERM GOAL #5   Title Pt will increase RUE strength to 4+/5 to improve ability to complete yardwork tasks.    Time 8   Period Weeks   Status New               Plan - August 08, 2015 1456    Clinical Impression Statement A: Pt is a 65 y/o male s/p right rotator cuff repair on 06/11/15, resulting in increased pain and fascial restrictions, decreased range of motion and strength in the RUE limiting pt's ability to complete daily and lesiure tasks using RUE as dominant. Pt educated on table slides and NWB status of RUE.    Rehab Potential Good   OT Frequency 2x / week   OT Duration 8 weeks   OT Treatment/Interventions Self-care/ADL training;Ultrasound;DME and/or AE instruction;Passive range of motion;Patient/family education;Cryotherapy;Electrical Stimulation;Moist Heat;Therapeutic exercise;Manual Therapy;Therapeutic activities   Plan P: Pt will benefit from skilled occupational therapy services to decrease pain and fascial restrictions, increase range of motion, strength, and functional use of RUE. Treatment plan: Myofascial release, P/ROM, AA/ROM, A/ROM, general RUE strengthening, scapular stability and strengthening, proximal shoulder strengthening.    OT Home Exercise Plan table slides   Consulted and Agree with Plan of Care Patient       Patient will benefit from skilled therapeutic intervention in order to improve the following deficits and impairments:  Decreased strength, Decreased knowledge of precautions, Pain, Impaired UE functional use, Decreased activity tolerance, Decreased range of motion, Increased fascial restricitons, Impaired flexibility  Visit Diagnosis: Status post rotator cuff repair  Pain in right shoulder  Other symptoms and signs involving the musculoskeletal system      G-Codes - Aug 08, 2015 1612    Functional Assessment Tool Used FOTO Score: 25/100 (75% impairment)   Functional Limitation Carrying, moving and handling objects   Carrying, Moving and Handling Objects Current Status [id number]) At least 60 percent but less than 80 percent impaired, limited or restricted   Carrying, Moving and Handling Objects Goal Status [id number]) At least 20 percent but less than 40 percent impaired, limited or restricted      Problem List Patient Active Problem List   Diagnosis Date Noted  . S/P total knee arthroplasty 09/17/2014  . Leg pain 10/14/2011    Guadelupe Sabin, OTR/L  [phone]  08-08-15, 4:13 PM  [place] [address] [place], [place], [address] Phone: [phone]   Fax:  [phone]  Name: JESSEY FRAGA MRN: [id number] Date of Birth: [birth year]

## 2015-07-24 ENCOUNTER — Ambulatory Visit (HOSPITAL_COMMUNITY): Payer: PPO

## 2015-07-24 ENCOUNTER — Encounter (HOSPITAL_COMMUNITY): Payer: Self-pay

## 2015-07-24 DIAGNOSIS — Z9889 Other specified postprocedural states: Secondary | ICD-10-CM | POA: Diagnosis not present

## 2015-07-24 DIAGNOSIS — M25511 Pain in right shoulder: Secondary | ICD-10-CM

## 2015-07-24 DIAGNOSIS — R29898 Other symptoms and signs involving the musculoskeletal system: Secondary | ICD-10-CM

## 2015-07-24 NOTE — Patient Instructions (Signed)
1) Seated Row   Sit up straight with elbows by your sides. Pull back with shoulders/elbows, keeping forearms straight, as if pulling back on the reins of a horse. Squeeze shoulder blades together. Repeat _10__times, __2-3__sets/day    2) Shoulder Elevation    Sit up straight with arms by your sides. Slowly bring your shoulders up towards your ears. Repeat_10__times, _2-3___ sets/day    3) Shoulder Extension    Sit up straight with both arms by your side, draw your arms back behind your waist. Keep your elbows straight. Repeat __10__times, _2-3___sets/day.        

## 2015-07-24 NOTE — Therapy (Addendum)
[place], [place], [address] Phone: [phone]   Fax:  [phone]  Occupational Therapy Treatment  Patient Details  Name: Harold Nicholson MRN: [id number] Date of Birth: 1950-07-06 Referring Provider: Dr. Charlotte Sanes  Encounter Date: 07/24/2015      OT End of Session - 07/24/15 1212    Visit Number 2   Number of Visits 16   Date for OT Re-Evaluation 09/17/15  Mini reassessment 08/17/15   Authorization Type [employer]   Authorization Time Period Before 10th visit   Authorization - Visit Number 2   Authorization - Number of Visits 10   OT Start Time 1115   OT Stop Time 1200   OT Time Calculation (min) 45 min   Activity Tolerance Patient tolerated treatment well   Behavior During Therapy [facility] for tasks assessed/performed      Past Medical History  Diagnosis Date  . Hypertension   . Hypercholesteremia   . Hiatal hernia   . Pure hyperglyceridemia   . Thyroid disease     Hypothyroid   . Impaired fasting glucose   . Arthritis   . Hypothyroidism   . Complication of anesthesia     pt stated he stopped breathing during surgery    Past Surgical History  Procedure Laterality Date  . Right knee      X 2  . Left knee arthroscopy    . Colonoscopy  10/07/2010    Procedure: COLONOSCOPY;  Surgeon: Jamesetta So;  [facility];  Service: Gastroenterology;  Laterality: N/A;  . Esophagogastroduodenoscopy  10/07/2010    Procedure: ESOPHAGOGASTRODUODENOSCOPY (EGD);  Surgeon: Jamesetta So;  [facility];  Service: Gastroenterology;  Laterality: N/A;  . Joint replacement      Right knee  . Cholecystectomy    . Cholecystectomy N/A 05/01/2013    Procedure: LAPAROSCOPIC CHOLECYSTECTOMY;  Surgeon: Jamesetta So, MD;  [facility]: [facility];  Service: General;  Laterality: N/A;  . Liver biopsy N/A 05/01/2013    Procedure: LIVER BIOPSY;  Surgeon: Jamesetta So, MD;  [facility]: [facility];   Service: General;  Laterality: N/A;  . Eye surgery    . Total knee arthroplasty Left 09/17/2014    Procedure: TOTAL KNEE ARTHROPLASTY;  Surgeon: Vickey Huger, MD;  Location: [place];  Service: Orthopedics;  Laterality: Left;    There were no vitals filed for this visit.          [facility] OT Assessment - 07/24/15 1140    Assessment   Diagnosis Right RCR   Precautions   Precautions Shoulder   Type of Shoulder Precautions Begin P/ROM at 6 weeks post-op: 6/6-flexion to 140, ER to 40.    Shoulder Interventions Shoulder sling/immobilizer;At all times                  OT Treatments/Exercises (OP) - 07/24/15 1140    Exercises   Exercises Shoulder   Shoulder Exercises: Supine   Protraction PROM;10 reps   Horizontal ABduction PROM;10 reps   External Rotation PROM;10 reps   Internal Rotation PROM;10 reps   Flexion PROM;10 reps   ABduction PROM;10 reps   Shoulder Exercises: Therapy Ball   Flexion 10 reps   ABduction 10 reps   Shoulder Exercises: ROM/Strengthening   Anterior Glide 3x10"   Shoulder Exercises: Isometric Strengthening   Flexion Supine;3X3"   Extension Supine;3X3"   External Rotation Supine;3X3"   Internal Rotation Supine;3X3"   ABduction Supine;3X3"  ADduction Supine;3X3"   Manual Therapy   Manual Therapy Myofascial release   Manual therapy comments Manual therapy completed prior to exercises.    Myofascial Release Myofascial release and manual stretching completed to  RUE upper arm, trapezius, and scapularis region to decrease fascial restrictions and increase joint mobility in a pain free zone.                 OT Education - 07/24/15 1144    Education provided Yes   Education Details A/ROM seated elevation, extension, and row   Person(s) Educated Patient   Methods Explanation;Demonstration;Verbal cues;Handout   Comprehension Returned demonstration;Verbalized understanding          OT Short Term Goals - 07/24/15 1216    OT SHORT TERM GOAL  #1   Title Pt will be educated on HEP.    Time 4   Period Weeks   Status On-going   OT SHORT TERM GOAL #2   Title Pt will decrease pain to 5/10 or less in RUE to increase ability to perform daily tasks.    Time 4   Period Weeks   Status On-going   OT SHORT TERM GOAL #3   Title Pt will decrease fascial restrictions from mod to min amounts or less in the RUE to increase mobility required for daily task completion.    Time 4   Period Weeks   Status On-going   OT SHORT TERM GOAL #4   Title Pt will increase P/ROM to [facility] in RUE to increase participation in dressing tasks.    Time 4   Period Weeks   Status On-going   OT SHORT TERM GOAL #5   Title Pt will increase RUE strength to 3/5 to increase ability to perform grooming tasks.    Time 4   Period Weeks   Status On-going           OT Long Term Goals - 07/24/15 1216    OT LONG TERM GOAL #1   Title Pt will return to prior level of functioning and independence in daily and leisure tasks using RUE as dominant.    Time 8   Period Weeks   Status On-going   OT LONG TERM GOAL #2   Title Pt will decrease pain in the RUE to 2/10 or less to increase ability to use RUE as dominant during daily tasks.    Time 8   Period Weeks   Status On-going   OT LONG TERM GOAL #3   Title Pt will decrease fascial restrictions in RUE from min to trace amounts or less to improve flexibility required for daily and leisure tasks.    Time 8   Period Weeks   Status On-going   OT LONG TERM GOAL #4   Title Pt will increase A/ROM to [facility] in RUE to improve ability to reach into overhead cabinets during daily tasks.    Time 8   Period Weeks   Status On-going   OT LONG TERM GOAL #5   Title Pt will increase RUE strength to 4+/5 to improve ability to complete yardwork tasks.    Time 8   Period Weeks   Status On-going               Plan - 07/24/15 1212    Clinical Impression Statement A: Initiated myofascial release, manual stretching, isometric  exercises, therapy ball exercises. Pt was given seated scapular A/ROM exercises for HEP. VC for form and technique.   Plan P:  Attempt thumb tacks.      Patient will benefit from skilled therapeutic intervention in order to improve the following deficits and impairments:  Decreased strength, Decreased knowledge of precautions, Pain, Impaired UE functional use, Decreased activity tolerance, Decreased range of motion, Increased fascial restricitons, Impaired flexibility  Visit Diagnosis: Pain in right shoulder  Other symptoms and signs involving the musculoskeletal system    Problem List Patient Active Problem List   Diagnosis Date Noted  . S/P total knee arthroplasty 09/17/2014  . Leg pain 10/14/2011     Ailene Ravel, OTR/L,CBIS  [phone]  07/24/2015, 12:17 PM  [place], [place], [address] Phone: [phone]   Fax:  [phone]  Name: Harold Nicholson MRN: [id number] Date of Birth: [birth year]

## 2015-07-26 ENCOUNTER — Ambulatory Visit (HOSPITAL_COMMUNITY): Payer: PPO | Admitting: Occupational Therapy

## 2015-07-26 ENCOUNTER — Encounter (HOSPITAL_COMMUNITY): Payer: Self-pay | Admitting: Occupational Therapy

## 2015-07-26 DIAGNOSIS — R29898 Other symptoms and signs involving the musculoskeletal system: Secondary | ICD-10-CM

## 2015-07-26 DIAGNOSIS — M25511 Pain in right shoulder: Secondary | ICD-10-CM

## 2015-07-26 DIAGNOSIS — Z9889 Other specified postprocedural states: Secondary | ICD-10-CM | POA: Diagnosis not present

## 2015-07-26 NOTE — Therapy (Addendum)
[place], [place], [address] Phone: [phone]   Fax:  [phone]  Occupational Therapy Treatment  Patient Details  Name: Harold Nicholson MRN: [id number] Date of Birth: 1950/12/08 Referring Provider: Dr. Charlotte Sanes  Encounter Date: 07/26/2015      OT End of Session - 07/26/15 1113    Visit Number 3   Number of Visits 16   Date for OT Re-Evaluation 09/17/15  Mini reassessment 08/17/15   Authorization Type [employer]   Authorization Time Period Before 10th visit   Authorization - Visit Number 3   Authorization - Number of Visits 10   OT Start Time 1030   OT Stop Time 1110   OT Time Calculation (min) 40 min   Activity Tolerance Patient tolerated treatment well   Behavior During Therapy [facility] for tasks assessed/performed      Past Medical History  Diagnosis Date  . Hypertension   . Hypercholesteremia   . Hiatal hernia   . Pure hyperglyceridemia   . Thyroid disease     Hypothyroid   . Impaired fasting glucose   . Arthritis   . Hypothyroidism   . Complication of anesthesia     pt stated he stopped breathing during surgery    Past Surgical History  Procedure Laterality Date  . Right knee      X 2  . Left knee arthroscopy    . Colonoscopy  10/07/2010    Procedure: COLONOSCOPY;  Surgeon: Jamesetta So;  [facility];  Service: Gastroenterology;  Laterality: N/A;  . Esophagogastroduodenoscopy  10/07/2010    Procedure: ESOPHAGOGASTRODUODENOSCOPY (EGD);  Surgeon: Jamesetta So;  [facility];  Service: Gastroenterology;  Laterality: N/A;  . Joint replacement      Right knee  . Cholecystectomy    . Cholecystectomy N/A 05/01/2013    Procedure: LAPAROSCOPIC CHOLECYSTECTOMY;  Surgeon: Jamesetta So, MD;  [facility];  Service: General;  Laterality: N/A;  . Liver biopsy N/A 05/01/2013    Procedure: LIVER BIOPSY;  Surgeon: Jamesetta So, MD;  [facility];   Service: General;  Laterality: N/A;  . Eye surgery    . Total knee arthroplasty Left 09/17/2014    Procedure: TOTAL KNEE ARTHROPLASTY;  Surgeon: Vickey Huger, MD;  Location: [place];  Service: Orthopedics;  Laterality: Left;    There were no vitals filed for this visit.      Subjective Assessment - 07/26/15 1027    Subjective  S: I was sore after the last time I came down here.    Currently in Pain? No/denies            [facility] OT Assessment - 07/26/15 1026    Assessment   Diagnosis Right RCR   Precautions   Precautions Shoulder   Type of Shoulder Precautions Begin P/ROM at 6 weeks post-op: 6/6-flexion to 140, ER to 40.    Shoulder Interventions Shoulder sling/immobilizer;At all times                  OT Treatments/Exercises (OP) - 07/26/15 1032    Exercises   Exercises Shoulder   Shoulder Exercises: Supine   Protraction PROM;10 reps   Horizontal ABduction PROM;10 reps   External Rotation PROM;10 reps   Internal Rotation PROM;10 reps   Flexion PROM;10 reps   ABduction PROM;10 reps   Shoulder Exercises: Seated   Elevation AROM;10 reps   Extension AROM;10 reps   Row AROM;10 reps  Shoulder Exercises: Therapy Ball   Flexion 15 reps   ABduction 15 reps   Shoulder Exercises: ROM/Strengthening   Thumb Tacks low thumb tacks 1'   Anterior Glide 3x10"   Shoulder Exercises: Isometric Strengthening   Flexion Supine;3X3"   Extension Supine;3X3"   External Rotation Supine;3X3"   Internal Rotation Supine;3X3"   ABduction Supine;3X3"   ADduction Supine;3X3"   Manual Therapy   Manual Therapy Myofascial release   Manual therapy comments Manual therapy completed prior to exercises.    Myofascial Release Myofascial release and manual stretching completed to  RUE upper arm, trapezius, and scapularis region to decrease fascial restrictions and increase joint mobility in a pain free zone.                   OT Short Term Goals - 07/24/15 1216    OT SHORT TERM  GOAL #1   Title Pt will be educated on HEP.    Time 4   Period Weeks   Status On-going   OT SHORT TERM GOAL #2   Title Pt will decrease pain to 5/10 or less in RUE to increase ability to perform daily tasks.    Time 4   Period Weeks   Status On-going   OT SHORT TERM GOAL #3   Title Pt will decrease fascial restrictions from mod to min amounts or less in the RUE to increase mobility required for daily task completion.    Time 4   Period Weeks   Status On-going   OT SHORT TERM GOAL #4   Title Pt will increase P/ROM to [facility] in RUE to increase participation in dressing tasks.    Time 4   Period Weeks   Status On-going   OT SHORT TERM GOAL #5   Title Pt will increase RUE strength to 3/5 to increase ability to perform grooming tasks.    Time 4   Period Weeks   Status On-going           OT Long Term Goals - 07/24/15 1216    OT LONG TERM GOAL #1   Title Pt will return to prior level of functioning and independence in daily and leisure tasks using RUE as dominant.    Time 8   Period Weeks   Status On-going   OT LONG TERM GOAL #2   Title Pt will decrease pain in the RUE to 2/10 or less to increase ability to use RUE as dominant during daily tasks.    Time 8   Period Weeks   Status On-going   OT LONG TERM GOAL #3   Title Pt will decrease fascial restrictions in RUE from min to trace amounts or less to improve flexibility required for daily and leisure tasks.    Time 8   Period Weeks   Status On-going   OT LONG TERM GOAL #4   Title Pt will increase A/ROM to [facility] in RUE to improve ability to reach into overhead cabinets during daily tasks.    Time 8   Period Weeks   Status On-going   OT LONG TERM GOAL #5   Title Pt will increase RUE strength to 4+/5 to improve ability to complete yardwork tasks.    Time 8   Period Weeks   Status On-going               Plan - 07/26/15 1113    Clinical Impression Statement A: Continued manual therapy, isometric exercises, therapy  ball, added low thumb  tacks. Pt reports home exercises are going well, does experience increased pain with abduction during table slides. Verbal cuing during scapular A/ROM exercises for depressing right shoulder.    Rehab Potential Good   OT Frequency 2x / week   OT Duration 8 weeks   OT Treatment/Interventions Self-care/ADL training;Ultrasound;DME and/or AE instruction;Passive range of motion;Patient/family education;Cryotherapy;Electrical Stimulation;Moist Heat;Therapeutic exercise;Manual Therapy;Therapeutic activities   Plan P: Increase isometrics to 3X5, continue to work on increasing P/ROM to WNL.    Consulted and Agree with Plan of Care Patient      Patient will benefit from skilled therapeutic intervention in order to improve the following deficits and impairments:  Decreased strength, Decreased knowledge of precautions, Pain, Impaired UE functional use, Decreased activity tolerance, Decreased range of motion, Increased fascial restricitons, Impaired flexibility  Visit Diagnosis: Pain in right shoulder  Other symptoms and signs involving the musculoskeletal system    Problem List Patient Active Problem List   Diagnosis Date Noted  . S/P total knee arthroplasty 09/17/2014  . Leg pain 10/14/2011    Guadelupe Sabin, OTR/L  [phone]  07/26/2015, 11:16 AM  [place], [place], [address] Phone: [phone]   Fax:  [phone]  Name: BUB FALKOWITZ MRN: [id number] Date of Birth: 06-06-1950

## 2015-07-31 ENCOUNTER — Ambulatory Visit (HOSPITAL_COMMUNITY): Payer: PPO

## 2015-07-31 DIAGNOSIS — Z9889 Other specified postprocedural states: Secondary | ICD-10-CM | POA: Diagnosis not present

## 2015-07-31 DIAGNOSIS — R29898 Other symptoms and signs involving the musculoskeletal system: Secondary | ICD-10-CM

## 2015-07-31 NOTE — Therapy (Addendum)
[place], [place], [address] Phone: [phone]   Fax:  [phone]  Occupational Therapy Treatment  Patient Details  Name: Harold Nicholson MRN: [id number] Date of Birth: [birth year] Referring Provider: Dr. Charlotte Sanes  Encounter Date: 07/31/2015      OT End of Session - 07/31/15 1252    Visit Number 4   Number of Visits 16   Date for OT Re-Evaluation 09/17/15  Mini reassessment 08/17/15   Authorization Type [employer]   Authorization Time Period Before 10th visit   Authorization - Visit Number 4   Authorization - Number of Visits 10   OT Start Time 1034  Pt requested to leave early.   OT Stop Time 1110   OT Time Calculation (min) 36 min   Activity Tolerance Patient tolerated treatment well   Behavior During Therapy [facility] for tasks assessed/performed      Past Medical History  Diagnosis Date  . Hypertension   . Hypercholesteremia   . Hiatal hernia   . Pure hyperglyceridemia   . Thyroid disease     Hypothyroid   . Impaired fasting glucose   . Arthritis   . Hypothyroidism   . Complication of anesthesia     pt stated he stopped breathing during surgery    Past Surgical History  Procedure Laterality Date  . Right knee      X 2  . Left knee arthroscopy    . Colonoscopy  10/07/2010    Procedure: COLONOSCOPY;  Surgeon: Jamesetta So;  [facility];  Service: Gastroenterology;  Laterality: N/A;  . Esophagogastroduodenoscopy  10/07/2010    Procedure: ESOPHAGOGASTRODUODENOSCOPY (EGD);  Surgeon: Jamesetta So;  [facility];  Service: Gastroenterology;  Laterality: N/A;  . Joint replacement      Right knee  . Cholecystectomy    . Cholecystectomy N/A 05/01/2013    Procedure: LAPAROSCOPIC CHOLECYSTECTOMY;  Surgeon: Jamesetta So, MD;  [facility];  Service: General;  Laterality: N/A;  . Liver biopsy N/A 05/01/2013    Procedure: LIVER BIOPSY;  Surgeon: Jamesetta So, MD;  [facility];  Service: General;  Laterality: N/A;  . Eye surgery    . Total knee arthroplasty Left 09/17/2014    Procedure: TOTAL KNEE ARTHROPLASTY;  Surgeon: Vickey Huger, MD;  Location: [place];  Service: Orthopedics;  Laterality: Left;    There were no vitals filed for this visit.      Subjective Assessment - 07/31/15 1247    Subjective  S: I can tell I'm getting better.   Currently in Pain? No/denies            [facility] OT Assessment - 07/31/15 1250    Assessment   Diagnosis Right RCR   Precautions   Precautions Shoulder   Type of Shoulder Precautions Begin P/ROM at 6 weeks post-op: 6/6-flexion to 140, ER to 40.    Shoulder Interventions Shoulder sling/immobilizer;At all times                  OT Treatments/Exercises (OP) - 07/31/15 1054    Exercises   Exercises Shoulder   Shoulder Exercises: Supine   Protraction PROM;10 reps   Horizontal ABduction PROM;10 reps   External Rotation PROM;10 reps   Internal Rotation PROM;10 reps   Flexion PROM;10 reps   ABduction PROM;10 reps   Shoulder Exercises: Therapy Ball   Flexion 15 reps   ABduction 15 reps   Shoulder Exercises:  ROM/Strengthening   Anterior Glide 3x10"   Caudal Glide 3x10"   Shoulder Exercises: Isometric Strengthening   Flexion Supine;3X5"   Extension Supine;3X5"   External Rotation Supine;3X5"   Internal Rotation Supine;3X5"   ABduction Supine;3X5"   ADduction Supine;3X5"   Manual Therapy   Manual Therapy Myofascial release   Manual therapy comments Manual therapy completed prior to exercises.    Myofascial Release Myofascial release and manual stretching completed to  RUE upper arm, trapezius, and scapularis region to decrease fascial restrictions and increase joint mobility in a pain free zone.                   OT Short Term Goals - 07/24/15 1216    OT SHORT TERM GOAL #1   Title Pt will be educated on HEP.    Time 4   Period Weeks   Status On-going   OT SHORT  TERM GOAL #2   Title Pt will decrease pain to 5/10 or less in RUE to increase ability to perform daily tasks.    Time 4   Period Weeks   Status On-going   OT SHORT TERM GOAL #3   Title Pt will decrease fascial restrictions from mod to min amounts or less in the RUE to increase mobility required for daily task completion.    Time 4   Period Weeks   Status On-going   OT SHORT TERM GOAL #4   Title Pt will increase P/ROM to [facility] in RUE to increase participation in dressing tasks.    Time 4   Period Weeks   Status On-going   OT SHORT TERM GOAL #5   Title Pt will increase RUE strength to 3/5 to increase ability to perform grooming tasks.    Time 4   Period Weeks   Status On-going           OT Long Term Goals - 07/24/15 1216    OT LONG TERM GOAL #1   Title Pt will return to prior level of functioning and independence in daily and leisure tasks using RUE as dominant.    Time 8   Period Weeks   Status On-going   OT LONG TERM GOAL #2   Title Pt will decrease pain in the RUE to 2/10 or less to increase ability to use RUE as dominant during daily tasks.    Time 8   Period Weeks   Status On-going   OT LONG TERM GOAL #3   Title Pt will decrease fascial restrictions in RUE from min to trace amounts or less to improve flexibility required for daily and leisure tasks.    Time 8   Period Weeks   Status On-going   OT LONG TERM GOAL #4   Title Pt will increase A/ROM to [facility] in RUE to improve ability to reach into overhead cabinets during daily tasks.    Time 8   Period Weeks   Status On-going   OT LONG TERM GOAL #5   Title Pt will increase RUE strength to 4+/5 to improve ability to complete yardwork tasks.    Time 8   Period Weeks   Status On-going               Plan - 07/31/15 1255    Clinical Impression Statement A: Increased isometrics to 3x5 and added caudal glide stretch. Patient making great progress towards therapy goals. Min VC needed for feorm and technique.    Plan  P: Continue to follow  protocol. Continue with thumb tacks and add pro/ret/elev/dep.      Patient will benefit from skilled therapeutic intervention in order to improve the following deficits and impairments:  Decreased strength, Decreased knowledge of precautions, Pain, Impaired UE functional use, Decreased activity tolerance, Decreased range of motion, Increased fascial restricitons, Impaired flexibility  Visit Diagnosis: Other symptoms and signs involving the musculoskeletal system    Problem List Patient Active Problem List   Diagnosis Date Noted  . S/P total knee arthroplasty 09/17/2014  . Leg pain 10/14/2011    Ailene Ravel, OTR/L,CBIS  [phone]  07/31/2015, 1:32 PM  [place] [address] [place], [place], [address] Phone: [phone]   Fax:  [phone]  Name: Harold Nicholson MRN: [id number] Date of Birth: 1950-06-15

## 2015-08-02 ENCOUNTER — Ambulatory Visit (HOSPITAL_COMMUNITY): Payer: PPO | Admitting: Occupational Therapy

## 2015-08-02 ENCOUNTER — Encounter (HOSPITAL_COMMUNITY): Payer: Self-pay | Admitting: Occupational Therapy

## 2015-08-02 DIAGNOSIS — R29898 Other symptoms and signs involving the musculoskeletal system: Secondary | ICD-10-CM

## 2015-08-02 DIAGNOSIS — M25511 Pain in right shoulder: Secondary | ICD-10-CM

## 2015-08-02 DIAGNOSIS — Z9889 Other specified postprocedural states: Secondary | ICD-10-CM | POA: Diagnosis not present

## 2015-08-02 NOTE — Therapy (Addendum)
[place], [place], [address] Phone: [phone]   Fax:  [phone]  Occupational Therapy Treatment  Patient Details  Name: Harold Nicholson MRN: [id number] Date of Birth: 10-22-1950 Referring Provider: Dr. Charlotte Sanes  Encounter Date: 08/02/2015      OT End of Session - 08/02/15 1106    Visit Number 5   Number of Visits 16   Date for OT Re-Evaluation 09/17/15  Mini reassessment 08/17/15   Authorization Type [employer]   Authorization Time Period Before 10th visit   Authorization - Visit Number 5   Authorization - Number of Visits 10   OT Start Time 1024   OT Stop Time 1105   OT Time Calculation (min) 41 min   Activity Tolerance Patient tolerated treatment well   Behavior During Therapy [facility] for tasks assessed/performed      Past Medical History  Diagnosis Date  . Hypertension   . Hypercholesteremia   . Hiatal hernia   . Pure hyperglyceridemia   . Thyroid disease     Hypothyroid   . Impaired fasting glucose   . Arthritis   . Hypothyroidism   . Complication of anesthesia     pt stated he stopped breathing during surgery    Past Surgical History  Procedure Laterality Date  . Right knee      X 2  . Left knee arthroscopy    . Colonoscopy  10/07/2010    Procedure: COLONOSCOPY;  Surgeon: Jamesetta So;  [facility];  Service: Gastroenterology;  Laterality: N/A;  . Esophagogastroduodenoscopy  10/07/2010    Procedure: ESOPHAGOGASTRODUODENOSCOPY (EGD);  Surgeon: Jamesetta So;  [facility];  Service: Gastroenterology;  Laterality: N/A;  . Joint replacement      Right knee  . Cholecystectomy    . Cholecystectomy N/A 05/01/2013    Procedure: LAPAROSCOPIC CHOLECYSTECTOMY;  Surgeon: Jamesetta So, MD;  [facility];  Service: General;  Laterality: N/A;  . Liver biopsy N/A 05/01/2013    Procedure: LIVER BIOPSY;  Surgeon: Jamesetta So, MD;  [facility];   Service: General;  Laterality: N/A;  . Eye surgery    . Total knee arthroplasty Left 09/17/2014    Procedure: TOTAL KNEE ARTHROPLASTY;  Surgeon: Vickey Huger, MD;  Location: [place];  Service: Orthopedics;  Laterality: Left;    There were no vitals filed for this visit.      Subjective Assessment - 08/02/15 1022    Subjective  S: I mowed the grass yesterday.    Currently in Pain? No/denies            [facility] OT Assessment - 08/02/15 1021    Assessment   Diagnosis Right RCR   Precautions   Precautions Shoulder   Type of Shoulder Precautions Begin P/ROM at 6 weeks post-op: 6/6-flexion to 140, ER to 40.    Shoulder Interventions Shoulder sling/immobilizer;At all times                  OT Treatments/Exercises (OP) - 08/02/15 1026    Exercises   Exercises Shoulder   Shoulder Exercises: Supine   Protraction PROM;10 reps   Horizontal ABduction PROM;10 reps   External Rotation PROM;10 reps   Internal Rotation PROM;10 reps   Flexion PROM;10 reps   ABduction PROM;10 reps   Shoulder Exercises: Seated   Elevation AROM;15 reps   Extension AROM;15 reps   Row AROM;15 reps   Shoulder Exercises: Therapy Diona Foley  Flexion 15 reps   ABduction 15 reps   Shoulder Exercises: ROM/Strengthening   Thumb Tacks low thumb tacks 1'   Anterior Glide 3x10"   Caudal Glide 3x10"   Prot/Ret//Elev/Dep 1'   Shoulder Exercises: Isometric Strengthening   Flexion Supine;3X5"   Extension Supine;3X5"   External Rotation Supine;3X5"   Internal Rotation Supine;3X5"   ABduction Supine;3X5"   ADduction Supine;3X5"   Manual Therapy   Manual Therapy Myofascial release   Manual therapy comments Manual therapy completed prior to exercises.    Myofascial Release Myofascial release and manual stretching completed to  RUE upper arm, trapezius, and scapularis region to decrease fascial restrictions and increase joint mobility in a pain free zone.                   OT Short Term Goals - 07/24/15  1216    OT SHORT TERM GOAL #1   Title Pt will be educated on HEP.    Time 4   Period Weeks   Status On-going   OT SHORT TERM GOAL #2   Title Pt will decrease pain to 5/10 or less in RUE to increase ability to perform daily tasks.    Time 4   Period Weeks   Status On-going   OT SHORT TERM GOAL #3   Title Pt will decrease fascial restrictions from mod to min amounts or less in the RUE to increase mobility required for daily task completion.    Time 4   Period Weeks   Status On-going   OT SHORT TERM GOAL #4   Title Pt will increase P/ROM to [facility] in RUE to increase participation in dressing tasks.    Time 4   Period Weeks   Status On-going   OT SHORT TERM GOAL #5   Title Pt will increase RUE strength to 3/5 to increase ability to perform grooming tasks.    Time 4   Period Weeks   Status On-going           OT Long Term Goals - 07/24/15 1216    OT LONG TERM GOAL #1   Title Pt will return to prior level of functioning and independence in daily and leisure tasks using RUE as dominant.    Time 8   Period Weeks   Status On-going   OT LONG TERM GOAL #2   Title Pt will decrease pain in the RUE to 2/10 or less to increase ability to use RUE as dominant during daily tasks.    Time 8   Period Weeks   Status On-going   OT LONG TERM GOAL #3   Title Pt will decrease fascial restrictions in RUE from min to trace amounts or less to improve flexibility required for daily and leisure tasks.    Time 8   Period Weeks   Status On-going   OT LONG TERM GOAL #4   Title Pt will increase A/ROM to [facility] in RUE to improve ability to reach into overhead cabinets during daily tasks.    Time 8   Period Weeks   Status On-going   OT LONG TERM GOAL #5   Title Pt will increase RUE strength to 4+/5 to improve ability to complete yardwork tasks.    Time 8   Period Weeks   Status On-going               Plan - 08/02/15 1107    Clinical Impression Statement A: Added prot/ret/elev/dep, continued  with P/ROM, scapular A/ROM,  and shoulder glides. Pt continues to improve P/ROM working towards WNL. Min verbal cuing required for form during exercises. Pt reports exercises are going well at home.    Rehab Potential Good   OT Frequency 2x / week   OT Duration 8 weeks   OT Treatment/Interventions Self-care/ADL training;Ultrasound;DME and/or AE instruction;Passive range of motion;Patient/family education;Cryotherapy;Electrical Stimulation;Moist Heat;Therapeutic exercise;Manual Therapy;Therapeutic activities   Plan P: Continue to follow protocol, increase isometrics to 5X5"   Consulted and Agree with Plan of Care Patient      Patient will benefit from skilled therapeutic intervention in order to improve the following deficits and impairments:  Decreased strength, Decreased knowledge of precautions, Pain, Impaired UE functional use, Decreased activity tolerance, Decreased range of motion, Increased fascial restricitons, Impaired flexibility  Visit Diagnosis: Other symptoms and signs involving the musculoskeletal system  Pain in right shoulder    Problem List Patient Active Problem List   Diagnosis Date Noted  . S/P total knee arthroplasty 09/17/2014  . Leg pain 10/14/2011    Guadelupe Sabin, OTR/L  [phone]  08/02/2015, 11:09 AM  [place], [place], [address] Phone: [phone]   Fax:  [phone]  Name: Harold Nicholson MRN: [id number] Date of Birth: [birth year]

## 2015-08-07 ENCOUNTER — Encounter (HOSPITAL_COMMUNITY): Payer: Self-pay

## 2015-08-07 ENCOUNTER — Ambulatory Visit (HOSPITAL_COMMUNITY): Payer: PPO

## 2015-08-07 DIAGNOSIS — R29898 Other symptoms and signs involving the musculoskeletal system: Secondary | ICD-10-CM

## 2015-08-07 DIAGNOSIS — Z9889 Other specified postprocedural states: Secondary | ICD-10-CM | POA: Diagnosis not present

## 2015-08-07 NOTE — Therapy (Signed)
[place], [place], [address] Phone: [phone]   Fax:  [phone]  Occupational Therapy Treatment  Patient Details  Name: Harold Nicholson MRN: [id number] Date of Birth: [birth year] Referring Provider: Dr. Charlotte Sanes  Encounter Date: 08/07/2015      OT End of Session - 08/07/15 1155    Visit Number 6   Number of Visits 16   Date for OT Re-Evaluation 09/17/15  Mini reassessment 08/17/15   Authorization Type [employer]   Authorization Time Period Before 10th visit   Authorization - Visit Number 6   Authorization - Number of Visits 10   OT Start Time 0900   OT Stop Time 0945   OT Time Calculation (min) 45 min   Activity Tolerance Patient tolerated treatment well   Behavior During Therapy [facility] for tasks assessed/performed      Past Medical History  Diagnosis Date  . Hypertension   . Hypercholesteremia   . Hiatal hernia   . Pure hyperglyceridemia   . Thyroid disease     Hypothyroid   . Impaired fasting glucose   . Arthritis   . Hypothyroidism   . Complication of anesthesia     pt stated he stopped breathing during surgery    Past Surgical History  Procedure Laterality Date  . Right knee      X 2  . Left knee arthroscopy    . Colonoscopy  10/07/2010    Procedure: COLONOSCOPY;  Surgeon: Jamesetta So;  [facility];  Service: Gastroenterology;  Laterality: N/A;  . Esophagogastroduodenoscopy  10/07/2010    Procedure: ESOPHAGOGASTRODUODENOSCOPY (EGD);  Surgeon: Jamesetta So;  [facility];  Service: Gastroenterology;  Laterality: N/A;  . Joint replacement      Right knee  . Cholecystectomy    . Cholecystectomy N/A 05/01/2013    Procedure: LAPAROSCOPIC CHOLECYSTECTOMY;  Surgeon: Jamesetta So, MD;  [facility];  Service: General;  Laterality: N/A;  . Liver biopsy N/A 05/01/2013    Procedure: LIVER BIOPSY;  Surgeon: Jamesetta So, MD;  [facility];   Service: General;  Laterality: N/A;  . Eye surgery    . Total knee arthroplasty Left 09/17/2014    Procedure: TOTAL KNEE ARTHROPLASTY;  Surgeon: Vickey Huger, MD;  Location: [place];  Service: Orthopedics;  Laterality: Left;    There were no vitals filed for this visit.      Subjective Assessment - 08/07/15 0920    Subjective  S: My arm feels good today.   Currently in Pain? No/denies            [facility] OT Assessment - 08/07/15 [id number]    Assessment   Diagnosis Right RCR   Precautions   Precautions Shoulder   Type of Shoulder Precautions Follow standard protocol. Begin AA/ROM and progress to A/ROM and strengthening.   Shoulder Interventions Shoulder sling/immobilizer;For comfort                  OT Treatments/Exercises (OP) - 08/07/15 [id number]    Exercises   Exercises Shoulder   Shoulder Exercises: Supine   Protraction PROM;AAROM;10 reps   Horizontal ABduction PROM;AAROM;10 reps   External Rotation PROM;AAROM;10 reps   Internal Rotation PROM;AAROM;10 reps   Flexion PROM;AAROM;10 reps   ABduction PROM;AAROM;10 reps   Shoulder Exercises: Standing   Protraction AAROM;10 reps   Flexion AAROM;10 reps   Shoulder Exercises: Pulleys   Flexion 1 minute   Manual Therapy  Manual Therapy Myofascial release   Manual therapy comments Manual therapy completed prior to exercises.    Myofascial Release Myofascial release and manual stretching completed to  RUE upper arm, trapezius, and scapularis region to decrease fascial restrictions and increase joint mobility in a pain free zone.                 OT Education - 08/07/15 [phone]    Education provided Yes   Education Details AA/ROM exercises   Person(s) Educated Patient   Methods Explanation;Demonstration;Verbal cues;Handout   Comprehension Returned demonstration;Verbalized understanding          OT Short Term Goals - 07/24/15 1216    OT SHORT TERM GOAL #1   Title Pt will be educated on HEP.    Time 4   Period  Weeks   Status On-going   OT SHORT TERM GOAL #2   Title Pt will decrease pain to 5/10 or less in RUE to increase ability to perform daily tasks.    Time 4   Period Weeks   Status On-going   OT SHORT TERM GOAL #3   Title Pt will decrease fascial restrictions from mod to min amounts or less in the RUE to increase mobility required for daily task completion.    Time 4   Period Weeks   Status On-going   OT SHORT TERM GOAL #4   Title Pt will increase P/ROM to [facility] in RUE to increase participation in dressing tasks.    Time 4   Period Weeks   Status On-going   OT SHORT TERM GOAL #5   Title Pt will increase RUE strength to 3/5 to increase ability to perform grooming tasks.    Time 4   Period Weeks   Status On-going           OT Long Term Goals - 07/24/15 1216    OT LONG TERM GOAL #1   Title Pt will return to prior level of functioning and independence in daily and leisure tasks using RUE as dominant.    Time 8   Period Weeks   Status On-going   OT LONG TERM GOAL #2   Title Pt will decrease pain in the RUE to 2/10 or less to increase ability to use RUE as dominant during daily tasks.    Time 8   Period Weeks   Status On-going   OT LONG TERM GOAL #3   Title Pt will decrease fascial restrictions in RUE from min to trace amounts or less to improve flexibility required for daily and leisure tasks.    Time 8   Period Weeks   Status On-going   OT LONG TERM GOAL #4   Title Pt will increase A/ROM to [facility] in RUE to improve ability to reach into overhead cabinets during daily tasks.    Time 8   Period Weeks   Status On-going   OT LONG TERM GOAL #5   Title Pt will increase RUE strength to 4+/5 to improve ability to complete yardwork tasks.    Time 8   Period Weeks   Status On-going               Plan - 08/07/15 1156    Clinical Impression Statement A: Progressed to AA/ROM supine and standing this session. Pt required min VC for intial form and technique. Updated HEP to  include new exercises.    Plan P: Add pulleys in abduction and proximal shoulder strengthening.  Patient will benefit from skilled therapeutic intervention in order to improve the following deficits and impairments:  Decreased strength, Decreased knowledge of precautions, Pain, Impaired UE functional use, Decreased activity tolerance, Decreased range of motion, Increased fascial restricitons, Impaired flexibility  Visit Diagnosis: Other symptoms and signs involving the musculoskeletal system    Problem List Patient Active Problem List   Diagnosis Date Noted  . S/P total knee arthroplasty 09/17/2014  . Leg pain 10/14/2011    Ailene Ravel, OTR/L,CBIS  [phone]  08/07/2015, 11:57 AM  [place] [address] [place], [place], [address] Phone: [phone]   Fax:  [phone]  Name: Harold Nicholson MRN: [id number] Date of Birth: [birth year]

## 2015-08-07 NOTE — Patient Instructions (Signed)
Perform each exercise ___10-12_____ reps. 2-3x days.   Protraction - STANDING  Start by holding a wand or cane at chest height.  Next, slowly push the wand outwards in front of your body so that your elbows become fully straightened. Then, return to the original position.     Shoulder FLEXION - STANDING - PALMS UP  In the standing position, hold a wand/cane with both arms, palms up on both sides. Raise up the wand/cane allowing your unaffected arm to perform most of the effort. Your affected arm should be partially relaxed.      Internal/External ROTATION - STANDING  In the standing position, hold a wand/cane with both hands keeping your elbows bent. Move your arms and wand/cane to one side.  Your affected arm should be partially relaxed while your unaffected arm performs most of the effort.       Shoulder ABDUCTION - STANDING  While holding a wand/cane palm face up on the injured side and palm face down on the uninjured side, slowly raise up your injured arm to the side.                     Horizontal Abduction/Adduction      Straight arms holding cane at shoulder height, bring cane to right, center, left. Repeat starting to left.   Copyright  [employer]. All rights reserved.       

## 2015-08-09 ENCOUNTER — Ambulatory Visit (HOSPITAL_COMMUNITY): Payer: PPO | Admitting: Occupational Therapy

## 2015-08-09 ENCOUNTER — Encounter (HOSPITAL_COMMUNITY): Payer: Self-pay | Admitting: Occupational Therapy

## 2015-08-09 DIAGNOSIS — Z9889 Other specified postprocedural states: Secondary | ICD-10-CM | POA: Diagnosis not present

## 2015-08-09 DIAGNOSIS — M25511 Pain in right shoulder: Secondary | ICD-10-CM

## 2015-08-09 DIAGNOSIS — R29898 Other symptoms and signs involving the musculoskeletal system: Secondary | ICD-10-CM

## 2015-08-09 NOTE — Therapy (Signed)
[place], [place], [address] Phone: [phone]   Fax:  [phone]  Occupational Therapy Treatment  Patient Details  Name: Harold Nicholson MRN: [id number] Date of Birth: 1950/03/16 Referring Provider: Dr. Charlotte Sanes  Encounter Date: 08/09/2015      OT End of Session - 08/09/15 1111    Visit Number 7   Number of Visits 16   Date for OT Re-Evaluation 09/17/15  Mini reassessment 08/17/15   Authorization Type [employer]   Authorization Time Period Before 10th visit   Authorization - Visit Number 7   Authorization - Number of Visits 10   OT Start Time 1028   OT Stop Time 1110   OT Time Calculation (min) 42 min   Activity Tolerance Patient tolerated treatment well   Behavior During Therapy [facility] for tasks assessed/performed      Past Medical History  Diagnosis Date  . Hypertension   . Hypercholesteremia   . Hiatal hernia   . Pure hyperglyceridemia   . Thyroid disease     Hypothyroid   . Impaired fasting glucose   . Arthritis   . Hypothyroidism   . Complication of anesthesia     pt stated he stopped breathing during surgery    Past Surgical History  Procedure Laterality Date  . Right knee      X 2  . Left knee arthroscopy    . Colonoscopy  10/07/2010    Procedure: COLONOSCOPY;  Surgeon: Jamesetta So;  [facility];  Service: Gastroenterology;  Laterality: N/A;  . Esophagogastroduodenoscopy  10/07/2010    Procedure: ESOPHAGOGASTRODUODENOSCOPY (EGD);  Surgeon: Jamesetta So;  [facility];  Service: Gastroenterology;  Laterality: N/A;  . Joint replacement      Right knee  . Cholecystectomy    . Cholecystectomy N/A 05/01/2013    Procedure: LAPAROSCOPIC CHOLECYSTECTOMY;  Surgeon: Jamesetta So, MD;  [facility];  Service: General;  Laterality: N/A;  . Liver biopsy N/A 05/01/2013    Procedure: LIVER BIOPSY;  Surgeon: Jamesetta So, MD;  [facility];   Service: General;  Laterality: N/A;  . Eye surgery    . Total knee arthroplasty Left 09/17/2014    Procedure: TOTAL KNEE ARTHROPLASTY;  Surgeon: Vickey Huger, MD;  Location: [place];  Service: Orthopedics;  Laterality: Left;    There were no vitals filed for this visit.      Subjective Assessment - 08/09/15 1029    Subjective  S: I was really sore on Thursday after my session on Wednesday.    Currently in Pain? No/denies            [facility] OT Assessment - 08/09/15 1029    Assessment   Diagnosis Right RCR   Precautions   Precautions Shoulder   Type of Shoulder Precautions Follow standard protocol. Begin AA/ROM and progress to A/ROM and strengthening.   Shoulder Interventions Shoulder sling/immobilizer;For comfort                  OT Treatments/Exercises (OP) - 08/09/15 1030    Exercises   Exercises Shoulder   Shoulder Exercises: Supine   Protraction PROM;AAROM;10 reps   Horizontal ABduction PROM;AAROM;10 reps   External Rotation PROM;AAROM;10 reps   Internal Rotation PROM;AAROM;10 reps   Flexion PROM;AAROM;10 reps   ABduction PROM;AAROM;10 reps   Shoulder Exercises: Standing   Protraction AAROM;10 reps   Horizontal ABduction AAROM;10 reps   External Rotation AAROM;10 reps  Internal Rotation AAROM;10 reps   Flexion AAROM;10 reps   ABduction AAROM;10 reps   Shoulder Exercises: Pulleys   Flexion 1 minute   ABduction 1 minute   Shoulder Exercises: ROM/Strengthening   Thumb Tacks 1'   Proximal Shoulder Strengthening, Supine 10X each no rest breaks   Manual Therapy   Manual Therapy Myofascial release   Manual therapy comments Manual therapy completed prior to exercises.    Myofascial Release Myofascial release and manual stretching completed to  RUE upper arm, trapezius, and scapularis region to decrease fascial restrictions and increase joint mobility in a pain free zone.                   OT Short Term Goals - 07/24/15 1216    OT SHORT TERM GOAL #1    Title Pt will be educated on HEP.    Time 4   Period Weeks   Status On-going   OT SHORT TERM GOAL #2   Title Pt will decrease pain to 5/10 or less in RUE to increase ability to perform daily tasks.    Time 4   Period Weeks   Status On-going   OT SHORT TERM GOAL #3   Title Pt will decrease fascial restrictions from mod to min amounts or less in the RUE to increase mobility required for daily task completion.    Time 4   Period Weeks   Status On-going   OT SHORT TERM GOAL #4   Title Pt will increase P/ROM to [facility] in RUE to increase participation in dressing tasks.    Time 4   Period Weeks   Status On-going   OT SHORT TERM GOAL #5   Title Pt will increase RUE strength to 3/5 to increase ability to perform grooming tasks.    Time 4   Period Weeks   Status On-going           OT Long Term Goals - 07/24/15 1216    OT LONG TERM GOAL #1   Title Pt will return to prior level of functioning and independence in daily and leisure tasks using RUE as dominant.    Time 8   Period Weeks   Status On-going   OT LONG TERM GOAL #2   Title Pt will decrease pain in the RUE to 2/10 or less to increase ability to use RUE as dominant during daily tasks.    Time 8   Period Weeks   Status On-going   OT LONG TERM GOAL #3   Title Pt will decrease fascial restrictions in RUE from min to trace amounts or less to improve flexibility required for daily and leisure tasks.    Time 8   Period Weeks   Status On-going   OT LONG TERM GOAL #4   Title Pt will increase A/ROM to [facility] in RUE to improve ability to reach into overhead cabinets during daily tasks.    Time 8   Period Weeks   Status On-going   OT LONG TERM GOAL #5   Title Pt will increase RUE strength to 4+/5 to improve ability to complete yardwork tasks.    Time 8   Period Weeks   Status On-going               Plan - 08/09/15 1111    Clinical Impression Statement A: Added remaining AA/ROM exercises in standing, proximal shoulder  strengthening in supine, abduction with pulleys. Pt required verbal cuing for form intermittently during exercises, reports  exercises are going well at home.    Rehab Potential Good   OT Frequency 2x / week   OT Duration 8 weeks   OT Treatment/Interventions Self-care/ADL training;Ultrasound;DME and/or AE instruction;Passive range of motion;Patient/family education;Cryotherapy;Electrical Stimulation;Moist Heat;Therapeutic exercise;Manual Therapy;Therapeutic activities   Plan P: Increase supine repetitions to 15, add wall wash, resume prot/ret/elev/dep      Patient will benefit from skilled therapeutic intervention in order to improve the following deficits and impairments:  Decreased strength, Decreased knowledge of precautions, Pain, Impaired UE functional use, Decreased activity tolerance, Decreased range of motion, Increased fascial restricitons, Impaired flexibility  Visit Diagnosis: Other symptoms and signs involving the musculoskeletal system  Pain in right shoulder    Problem List Patient Active Problem List   Diagnosis Date Noted  . S/P total knee arthroplasty 09/17/2014  . Leg pain 10/14/2011    Guadelupe Sabin, OTR/L  [phone] 08/09/2015, 11:13 AM  [place], [place], [address] Phone: [phone]   Fax:  [phone]  Name: DYON ROTERT MRN: [id number] Date of Birth: 1950-07-14

## 2015-08-14 ENCOUNTER — Ambulatory Visit (HOSPITAL_COMMUNITY): Payer: PPO

## 2015-08-14 DIAGNOSIS — R29898 Other symptoms and signs involving the musculoskeletal system: Secondary | ICD-10-CM

## 2015-08-14 DIAGNOSIS — Z9889 Other specified postprocedural states: Secondary | ICD-10-CM | POA: Diagnosis not present

## 2015-08-14 NOTE — Therapy (Signed)
[place], [place], [address] Phone: [phone]   Fax:  [phone]  Occupational Therapy Treatment  Patient Details  Name: Harold Nicholson MRN: [id number] Date of Birth: [birth year] Referring Provider: Dr. Charlotte Sanes  Encounter Date: 08/14/2015      OT End of Session - 08/14/15 1110    Visit Number 8   Number of Visits 16   Date for OT Re-Evaluation 09/17/15  Mini reassessment 08/17/15   Authorization Type [employer]   Authorization Time Period Before 10th visit   Authorization - Visit Number 8   Authorization - Number of Visits 10   OT Start Time 1035   OT Stop Time 1115   OT Time Calculation (min) 40 min   Activity Tolerance Patient tolerated treatment well   Behavior During Therapy [facility] for tasks assessed/performed      Past Medical History  Diagnosis Date  . Hypertension   . Hypercholesteremia   . Hiatal hernia   . Pure hyperglyceridemia   . Thyroid disease     Hypothyroid   . Impaired fasting glucose   . Arthritis   . Hypothyroidism   . Complication of anesthesia     pt stated he stopped breathing during surgery    Past Surgical History  Procedure Laterality Date  . Right knee      X 2  . Left knee arthroscopy    . Colonoscopy  10/07/2010    Procedure: COLONOSCOPY;  Surgeon: Jamesetta So;  [facility];  Service: Gastroenterology;  Laterality: N/A;  . Esophagogastroduodenoscopy  10/07/2010    Procedure: ESOPHAGOGASTRODUODENOSCOPY (EGD);  Surgeon: Jamesetta So;  [facility];  Service: Gastroenterology;  Laterality: N/A;  . Joint replacement      Right knee  . Cholecystectomy    . Cholecystectomy N/A 05/01/2013    Procedure: LAPAROSCOPIC CHOLECYSTECTOMY;  Surgeon: Jamesetta So, MD;  [facility]: [facility];  Service: General;  Laterality: N/A;  . Liver biopsy N/A 05/01/2013    Procedure: LIVER BIOPSY;  Surgeon: Jamesetta So, MD;  Location[facility];   Service: General;  Laterality: N/A;  . Eye surgery    . Total knee arthroplasty Left 09/17/2014    Procedure: TOTAL KNEE ARTHROPLASTY;  Surgeon: Vickey Huger, MD;  Location: [place];  Service: Orthopedics;  Laterality: Left;    There were no vitals filed for this visit.      Subjective Assessment - 08/14/15 1209    Subjective  S: When I used the stick I was sore.    Currently in Pain? No/denies                      OT Treatments/Exercises (OP) - 08/14/15 1055    Exercises   Exercises Shoulder   Shoulder Exercises: Supine   Protraction PROM;5 reps;AAROM;15 reps   Horizontal ABduction PROM;5 reps;AAROM;15 reps   External Rotation PROM;5 reps;AAROM;15 reps   Internal Rotation PROM;5 reps;AAROM;15 reps   Flexion PROM;5 reps;AAROM;15 reps   ABduction PROM;5 reps;AAROM;12 reps   Shoulder Exercises: Standing   Protraction AAROM;12 reps   Horizontal ABduction AAROM;12 reps   External Rotation AAROM;12 reps   Internal Rotation AAROM;12 reps   Flexion AAROM;12 reps   ABduction AAROM;12 reps   Shoulder Exercises: Pulleys   Flexion 1 minute   ABduction 1 minute   Shoulder Exercises: ROM/Strengthening   Wall Wash 1'   Thumb Tacks 1'   Proximal Shoulder Strengthening,  Supine 12X with no rest breaks   Prot/Ret//Elev/Dep 1'  physical and verbal cues needed   Manual Therapy   Manual Therapy Myofascial release   Manual therapy comments Manual therapy completed prior to exercises.                   OT Short Term Goals - 07/24/15 1216    OT SHORT TERM GOAL #1   Title Pt will be educated on HEP.    Time 4   Period Weeks   Status On-going   OT SHORT TERM GOAL #2   Title Pt will decrease pain to 5/10 or less in RUE to increase ability to perform daily tasks.    Time 4   Period Weeks   Status On-going   OT SHORT TERM GOAL #3   Title Pt will decrease fascial restrictions from mod to min amounts or less in the RUE to increase mobility required for daily task  completion.    Time 4   Period Weeks   Status On-going   OT SHORT TERM GOAL #4   Title Pt will increase P/ROM to [facility] in RUE to increase participation in dressing tasks.    Time 4   Period Weeks   Status On-going   OT SHORT TERM GOAL #5   Title Pt will increase RUE strength to 3/5 to increase ability to perform grooming tasks.    Time 4   Period Weeks   Status On-going           OT Long Term Goals - 07/24/15 1216    OT LONG TERM GOAL #1   Title Pt will return to prior level of functioning and independence in daily and leisure tasks using RUE as dominant.    Time 8   Period Weeks   Status On-going   OT LONG TERM GOAL #2   Title Pt will decrease pain in the RUE to 2/10 or less to increase ability to use RUE as dominant during daily tasks.    Time 8   Period Weeks   Status On-going   OT LONG TERM GOAL #3   Title Pt will decrease fascial restrictions in RUE from min to trace amounts or less to improve flexibility required for daily and leisure tasks.    Time 8   Period Weeks   Status On-going   OT LONG TERM GOAL #4   Title Pt will increase A/ROM to [facility] in RUE to improve ability to reach into overhead cabinets during daily tasks.    Time 8   Period Weeks   Status On-going   OT LONG TERM GOAL #5   Title Pt will increase RUE strength to 4+/5 to improve ability to complete yardwork tasks.    Time 8   Period Weeks   Status On-going               Plan - 08/14/15 1111    Clinical Impression Statement A: Added wall wash and increased supine AA/ROM to 15 repetitions. Pt required physical and verbal cues for pro/ret/elev/dep for proper form and technique.   Plan P: mini reassesment      Patient will benefit from skilled therapeutic intervention in order to improve the following deficits and impairments:  Decreased strength, Decreased knowledge of precautions, Pain, Impaired UE functional use, Decreased activity tolerance, Decreased range of motion, Increased fascial  restricitons, Impaired flexibility  Visit Diagnosis: Other symptoms and signs involving the musculoskeletal system    Problem List Patient  Active Problem List   Diagnosis Date Noted  . S/P total knee arthroplasty 09/17/2014  . Leg pain 10/14/2011    Ailene Ravel, OTR/L,CBIS  [phone]  08/14/2015, 12:19 PM  [place] [address] [place], [place], [address] Phone: [phone]   Fax:  [phone]  Name: Harold Nicholson MRN: [id number] Date of Birth: March 01, 1950

## 2015-08-21 ENCOUNTER — Ambulatory Visit (HOSPITAL_COMMUNITY): Payer: PPO | Attending: Orthopedic Surgery

## 2015-08-21 ENCOUNTER — Encounter (HOSPITAL_COMMUNITY): Payer: Self-pay

## 2015-08-21 DIAGNOSIS — M25511 Pain in right shoulder: Secondary | ICD-10-CM | POA: Diagnosis present

## 2015-08-21 DIAGNOSIS — Z9889 Other specified postprocedural states: Secondary | ICD-10-CM | POA: Diagnosis present

## 2015-08-21 DIAGNOSIS — R29898 Other symptoms and signs involving the musculoskeletal system: Secondary | ICD-10-CM | POA: Insufficient documentation

## 2015-08-21 NOTE — Therapy (Signed)
[place], [place], [address] Phone: [phone]   Fax:  [phone]  Occupational Therapy Treatment And mini reassessment Patient Details  Name: Harold Nicholson MRN: [id number] Date of Birth: May 30, 1950 Referring Provider: Dr. Charlotte Sanes  Encounter Date: 08/21/2015      OT End of Session - 08/21/15 1114    Visit Number 9   Number of Visits 16   Date for OT Re-Evaluation 09/17/15   Authorization Type [employer]   Authorization Time Period Before 19th visit   Authorization - Visit Number 9   Authorization - Number of Visits 19   OT Start Time 1035  mini reassessment   OT Stop Time 1115   OT Time Calculation (min) 40 min   Activity Tolerance Patient tolerated treatment well   Behavior During Therapy [facility] for tasks assessed/performed      Past Medical History  Diagnosis Date  . Hypertension   . Hypercholesteremia   . Hiatal hernia   . Pure hyperglyceridemia   . Thyroid disease     Hypothyroid   . Impaired fasting glucose   . Arthritis   . Hypothyroidism   . Complication of anesthesia     pt stated he stopped breathing during surgery    Past Surgical History  Procedure Laterality Date  . Right knee      X 2  . Left knee arthroscopy    . Colonoscopy  10/07/2010    Procedure: COLONOSCOPY;  Surgeon: Jamesetta So;  [facility];  Service: Gastroenterology;  Laterality: N/A;  . Esophagogastroduodenoscopy  10/07/2010    Procedure: ESOPHAGOGASTRODUODENOSCOPY (EGD);  Surgeon: Jamesetta So;  [facility];  Service: Gastroenterology;  Laterality: N/A;  . Joint replacement      Right knee  . Cholecystectomy    . Cholecystectomy N/A 05/01/2013    Procedure: LAPAROSCOPIC CHOLECYSTECTOMY;  Surgeon: Jamesetta So, MD;  [facility];  Service: General;  Laterality: N/A;  . Liver biopsy N/A 05/01/2013    Procedure: LIVER BIOPSY;  Surgeon: Jamesetta So, MD;  [facility];  Service: General;  Laterality: N/A;  . Eye surgery    . Total knee arthroplasty Left 09/17/2014    Procedure: TOTAL KNEE ARTHROPLASTY;  Surgeon: Vickey Huger, MD;  Location: [place];  Service: Orthopedics;  Laterality: Left;    There were no vitals filed for this visit.          [facility] OT Assessment - 08/21/15 1038    Assessment   Diagnosis Right RCR   Precautions   Precautions Shoulder   Type of Shoulder Precautions Follow standard protocol. Begin AA/ROM and progress to A/ROM and strengthening.   Shoulder Interventions Shoulder sling/immobilizer;For comfort   AROM   Overall AROM Comments Assessed  seated. IR/er adducted. A/ROM was not assessed prior to this date.    AROM Assessment Site Shoulder   Right/Left Shoulder Right   Right Shoulder Flexion 155 Degrees   Right Shoulder ABduction 155 Degrees   Right Shoulder Internal Rotation 90 Degrees   Right Shoulder External Rotation 78 Degrees   PROM   Overall PROM  Within functional limits for tasks performed   Overall PROM Comments Assessed supine, IR/er adducted   PROM Assessment Site Shoulder   Right/Left Shoulder Right   Strength   Overall Strength Comments Assessed seated. IR/er adducted. Strength assessed for the first time today.   Strength Assessment Site Shoulder   Right/Left Shoulder Right  Right Shoulder Flexion 3+/5   Right Shoulder ABduction 3+/5   Right Shoulder Internal Rotation 4+/5   Right Shoulder External Rotation 3+/5                  OT Treatments/Exercises (OP) - 08/21/15 1049    Exercises   Exercises Shoulder   Shoulder Exercises: Supine   Protraction PROM;5 reps;AAROM;15 reps   Horizontal ABduction PROM;5 reps;AAROM;15 reps   External Rotation PROM;5 reps;AAROM;15 reps   Internal Rotation PROM;5 reps;AAROM;15 reps   Flexion PROM;5 reps;AAROM;15 reps   ABduction PROM;5 reps;AAROM;12 reps   Shoulder Exercises: Standing   Protraction AAROM;15 reps   Horizontal ABduction AAROM;15  reps   External Rotation AAROM;15 reps   Internal Rotation AAROM;15 reps   Flexion AAROM;15 reps   ABduction AAROM;15 reps   Shoulder Exercises: Pulleys   Flexion 1 minute   ABduction 1 minute   Shoulder Exercises: ROM/Strengthening   Wall Wash 1'   Proximal Shoulder Strengthening, Seated 10X with no rests   Manual Therapy   Manual Therapy Myofascial release   Manual therapy comments Manual therapy completed prior to exercises.    Myofascial Release Myofascial release and manual stretching completed to  RUE upper arm, trapezius, and scapularis region to decrease fascial restrictions and increase joint mobility in a pain free zone.                   OT Short Term Goals - 08/21/15 1112    OT SHORT TERM GOAL #1   Title Pt will be educated on HEP.    Time 4   Period Weeks   Status Achieved   OT SHORT TERM GOAL #2   Title Pt will decrease pain to 5/10 or less in RUE to increase ability to perform daily tasks.    Time 4   Period Weeks   Status Achieved   OT SHORT TERM GOAL #3   Title Pt will decrease fascial restrictions from mod to min amounts or less in the RUE to increase mobility required for daily task completion.    Time 4   Period Weeks   Status Achieved   OT SHORT TERM GOAL #4   Title Pt will increase P/ROM to [facility] in RUE to increase participation in dressing tasks.    Time 4   Period Weeks   Status Achieved   OT SHORT TERM GOAL #5   Title Pt will increase RUE strength to 3/5 to increase ability to perform grooming tasks.    Time 4   Period Weeks   Status Achieved           OT Long Term Goals - 07/24/15 1216    OT LONG TERM GOAL #1   Title Pt will return to prior level of functioning and independence in daily and leisure tasks using RUE as dominant.    Time 8   Period Weeks   Status On-going   OT LONG TERM GOAL #2   Title Pt will decrease pain in the RUE to 2/10 or less to increase ability to use RUE as dominant during daily tasks.    Time 8    Period Weeks   Status On-going   OT LONG TERM GOAL #3   Title Pt will decrease fascial restrictions in RUE from min to trace amounts or less to improve flexibility required for daily and leisure tasks.    Time 8   Period Weeks   Status On-going   OT LONG TERM GOAL #  4   Title Pt will increase A/ROM to [facility] in RUE to improve ability to reach into overhead cabinets during daily tasks.    Time 8   Period Weeks   Status On-going   OT LONG TERM GOAL #5   Title Pt will increase RUE strength to 4+/5 to improve ability to complete yardwork tasks.    Time 8   Period Weeks   Status On-going               Plan - 09-15-15 1114    Clinical Impression Statement A: Mini reassessment completed this date. patient has met all short term goals and has made improvement with strength and ROM measurements today. Patient reports that he is able to complete more activities with his right arm particularly activities below his shoulder level. Pt continues to have deficits related to pain level during tasks and strength.    Plan P: Continue with AA/ROM exercises supine and standing. Add A/ROM prone if able to tolerate.      Patient will benefit from skilled therapeutic intervention in order to improve the following deficits and impairments:  Decreased strength, Decreased knowledge of precautions, Pain, Impaired UE functional use, Decreased activity tolerance, Decreased range of motion, Increased fascial restricitons, Impaired flexibility  Visit Diagnosis: Other symptoms and signs involving the musculoskeletal system  Pain in right shoulder  Status post rotator cuff repair      G-Codes - September 15, 2015 1117    Functional Assessment Tool Used FOTO score: 60/100 (40% impaired)   Functional Limitation Carrying, moving and handling objects   Carrying, Moving and Handling Objects Current Status (L9379) At least 40 percent but less than 60 percent impaired, limited or restricted   Carrying, Moving and Handling  Objects Goal Status (K2409) At least 20 percent but less than 40 percent impaired, limited or restricted      Problem List Patient Active Problem List   Diagnosis Date Noted  . S/P total knee arthroplasty 09/17/2014  . Leg pain 10/14/2011   Ailene Ravel, OTR/L,CBIS  [phone]  09-15-15, 11:30 AM  [place] [address] [place], [place], [address] Phone: [phone]   Fax:  [phone]  Name: KDEN WAGSTER MRN: [id number] Date of Birth: 1950/08/20

## 2015-08-23 ENCOUNTER — Encounter (HOSPITAL_COMMUNITY): Payer: Self-pay | Admitting: Occupational Therapy

## 2015-08-23 ENCOUNTER — Ambulatory Visit (HOSPITAL_COMMUNITY): Payer: PPO | Admitting: Occupational Therapy

## 2015-08-23 DIAGNOSIS — R29898 Other symptoms and signs involving the musculoskeletal system: Secondary | ICD-10-CM

## 2015-08-23 DIAGNOSIS — M25511 Pain in right shoulder: Secondary | ICD-10-CM

## 2015-08-23 NOTE — Therapy (Signed)
[place], [place], [address] Phone: [phone]   Fax:  [phone]  Occupational Therapy Treatment  Patient Details  Name: Harold Nicholson MRN: [id number] Date of Birth: 11-Feb-1951 Referring Provider: Dr. Charlotte Sanes  Encounter Date: 08/23/2015      OT End of Session - 08/23/15 1114    Visit Number 10   Number of Visits 16   Date for OT Re-Evaluation 09/17/15   Authorization Type [employer]   Authorization Time Period Before 19th visit   Authorization - Visit Number 10   Authorization - Number of Visits 19   OT Start Time 1030   OT Stop Time 1113   OT Time Calculation (min) 43 min   Activity Tolerance Patient tolerated treatment well   Behavior During Therapy [facility] for tasks assessed/performed      Past Medical History  Diagnosis Date  . Hypertension   . Hypercholesteremia   . Hiatal hernia   . Pure hyperglyceridemia   . Thyroid disease     Hypothyroid   . Impaired fasting glucose   . Arthritis   . Hypothyroidism   . Complication of anesthesia     pt stated he stopped breathing during surgery    Past Surgical History  Procedure Laterality Date  . Right knee      X 2  . Left knee arthroscopy    . Colonoscopy  10/07/2010    Procedure: COLONOSCOPY;  Surgeon: Jamesetta So;  [facility];  Service: Gastroenterology;  Laterality: N/A;  . Esophagogastroduodenoscopy  10/07/2010    Procedure: ESOPHAGOGASTRODUODENOSCOPY (EGD);  Surgeon: Jamesetta So;  [facility];  Service: Gastroenterology;  Laterality: N/A;  . Joint replacement      Right knee  . Cholecystectomy    . Cholecystectomy N/A 05/01/2013    Procedure: LAPAROSCOPIC CHOLECYSTECTOMY;  Surgeon: Jamesetta So, MD;  [facility];  Service: General;  Laterality: N/A;  . Liver biopsy N/A 05/01/2013    Procedure: LIVER BIOPSY;  Surgeon: Jamesetta So, MD;  [facility];  Service: General;  Laterality:  N/A;  . Eye surgery    . Total knee arthroplasty Left 09/17/2014    Procedure: TOTAL KNEE ARTHROPLASTY;  Surgeon: Vickey Huger, MD;  Location: [place];  Service: Orthopedics;  Laterality: Left;    There were no vitals filed for this visit.      Subjective Assessment - 08/23/15 1029    Subjective  S: I slept on it wrong and it's been sore ever since.    Currently in Pain? Yes   Pain Score 1    Pain Location Shoulder   Pain Orientation Right   Pain Descriptors / Indicators Sore   Pain Type Acute pain   Pain Radiating Towards n/a   Pain Onset 1 to 4 weeks ago   Pain Frequency Intermittent   Aggravating Factors  movement, use   Pain Relieving Factors ice   Effect of Pain on Daily Activities limited use of RUE during daily tasks   Multiple Pain Sites No            [facility] OT Assessment - 08/23/15 1028    Assessment   Diagnosis Right RCR   Precautions   Precautions Shoulder   Type of Shoulder Precautions Follow standard protocol. Begin AA/ROM and progress to A/ROM and strengthening.   Shoulder Interventions Shoulder sling/immobilizer;For comfort  OT Treatments/Exercises (OP) - 08/23/15 1033    Exercises   Exercises Shoulder   Shoulder Exercises: Supine   Protraction PROM;5 reps;AAROM;15 reps   Horizontal ABduction PROM;5 reps;AAROM;15 reps   External Rotation PROM;5 reps;AAROM;15 reps   Internal Rotation PROM;5 reps;AAROM;15 reps   Flexion PROM;5 reps;AAROM;15 reps   ABduction PROM;5 reps;AAROM;12 reps   Shoulder Exercises: Standing   Protraction AAROM;15 reps   Horizontal ABduction AAROM;15 reps   External Rotation AAROM;15 reps   Internal Rotation AAROM;15 reps   Flexion AAROM;15 reps   ABduction AAROM;15 reps   Shoulder Exercises: Pulleys   Flexion 2 minutes   ABduction 2 minutes   Shoulder Exercises: ROM/Strengthening   Wall Wash 2'   Proximal Shoulder Strengthening, Supine 12X with no rest breaks   Prot/Ret//Elev/Dep 1'   Manual  Therapy   Manual Therapy Myofascial release   Manual therapy comments Manual therapy completed prior to exercises.    Myofascial Release Myofascial release and manual stretching completed to  RUE upper arm, trapezius, and scapularis region to decrease fascial restrictions and increase joint mobility in a pain free zone.                   OT Short Term Goals - 08/21/15 1112    OT SHORT TERM GOAL #1   Title Pt will be educated on HEP.    Time 4   Period Weeks   Status Achieved   OT SHORT TERM GOAL #2   Title Pt will decrease pain to 5/10 or less in RUE to increase ability to perform daily tasks.    Time 4   Period Weeks   Status Achieved   OT SHORT TERM GOAL #3   Title Pt will decrease fascial restrictions from mod to min amounts or less in the RUE to increase mobility required for daily task completion.    Time 4   Period Weeks   Status Achieved   OT SHORT TERM GOAL #4   Title Pt will increase P/ROM to [facility] in RUE to increase participation in dressing tasks.    Time 4   Period Weeks   Status Achieved   OT SHORT TERM GOAL #5   Title Pt will increase RUE strength to 3/5 to increase ability to perform grooming tasks.    Time 4   Period Weeks   Status Achieved           OT Long Term Goals - 07/24/15 1216    OT LONG TERM GOAL #1   Title Pt will return to prior level of functioning and independence in daily and leisure tasks using RUE as dominant.    Time 8   Period Weeks   Status On-going   OT LONG TERM GOAL #2   Title Pt will decrease pain in the RUE to 2/10 or less to increase ability to use RUE as dominant during daily tasks.    Time 8   Period Weeks   Status On-going   OT LONG TERM GOAL #3   Title Pt will decrease fascial restrictions in RUE from min to trace amounts or less to improve flexibility required for daily and leisure tasks.    Time 8   Period Weeks   Status On-going   OT LONG TERM GOAL #4   Title Pt will increase A/ROM to [facility] in RUE to improve  ability to reach into overhead cabinets during daily tasks.    Time 8   Period Weeks   Status On-going  OT LONG TERM GOAL #5   Title Pt will increase RUE strength to 4+/5 to improve ability to complete yardwork tasks.    Time 8   Period Weeks   Status On-going               Plan - 08/23/15 1114    Clinical Impression Statement A: Continued with AA/ROM, rest break needed during standing horizontal abduction due to fatigue. Unable to add prone A/ROM due to  inability to tolerate lying in prone position. Verbal cuing for form required during exercises. Pt reports he does 10-15 repetitions at home with rest breaks as needed.    Rehab Potential Good   OT Frequency 2x / week   OT Duration 8 weeks   OT Treatment/Interventions Self-care/ADL training;Ultrasound;DME and/or AE instruction;Passive range of motion;Patient/family education;Cryotherapy;Electrical Stimulation;Moist Heat;Therapeutic exercise;Manual Therapy;Therapeutic activities   Plan P: Add red scapular theraband.    Consulted and Agree with Plan of Care Patient      Patient will benefit from skilled therapeutic intervention in order to improve the following deficits and impairments:  Decreased strength, Decreased knowledge of precautions, Pain, Impaired UE functional use, Decreased activity tolerance, Decreased range of motion, Increased fascial restricitons, Impaired flexibility  Visit Diagnosis: Other symptoms and signs involving the musculoskeletal system  Pain in right shoulder    Problem List Patient Active Problem List   Diagnosis Date Noted  . S/P total knee arthroplasty 09/17/2014  . Leg pain 10/14/2011    Guadelupe Sabin, OTR/L  [phone]  08/23/2015, 11:17 AM  [place], [place], [address] Phone: [phone]   Fax:  [phone]  Name: Harold Nicholson MRN: [id number] Date of Birth: Dec 30, 1950

## 2015-08-28 ENCOUNTER — Ambulatory Visit (HOSPITAL_COMMUNITY): Payer: PPO

## 2015-08-28 ENCOUNTER — Encounter (HOSPITAL_COMMUNITY): Payer: Self-pay

## 2015-08-28 DIAGNOSIS — R29898 Other symptoms and signs involving the musculoskeletal system: Secondary | ICD-10-CM | POA: Diagnosis not present

## 2015-08-28 NOTE — Therapy (Signed)
[place], [place], [address] Phone: [phone]   Fax:  [phone]  Occupational Therapy Treatment  Patient Details  Name: Harold Nicholson MRN: [id number] Date of Birth: 1950-04-14 Referring Provider: Dr. Charlotte Sanes  Encounter Date: 08/28/2015      OT End of Session - 08/28/15 1112    Visit Number 11   Number of Visits 16   Date for OT Re-Evaluation 09/17/15   Authorization Type [employer]   Authorization Time Period Before 19th visit   Authorization - Visit Number 11   Authorization - Number of Visits 19   OT Start Time 1034   OT Stop Time 1115   OT Time Calculation (min) 41 min   Activity Tolerance Patient tolerated treatment well   Behavior During Therapy [facility] for tasks assessed/performed      Past Medical History  Diagnosis Date  . Hypertension   . Hypercholesteremia   . Hiatal hernia   . Pure hyperglyceridemia   . Thyroid disease     Hypothyroid   . Impaired fasting glucose   . Arthritis   . Hypothyroidism   . Complication of anesthesia     pt stated he stopped breathing during surgery    Past Surgical History  Procedure Laterality Date  . Right knee      X 2  . Left knee arthroscopy    . Colonoscopy  10/07/2010    Procedure: COLONOSCOPY;  Surgeon: Jamesetta So;  [facility];  Service: Gastroenterology;  Laterality: N/A;  . Esophagogastroduodenoscopy  10/07/2010    Procedure: ESOPHAGOGASTRODUODENOSCOPY (EGD);  Surgeon: Jamesetta So;  [facility];  Service: Gastroenterology;  Laterality: N/A;  . Joint replacement      Right knee  . Cholecystectomy    . Cholecystectomy N/A 05/01/2013    Procedure: LAPAROSCOPIC CHOLECYSTECTOMY;  Surgeon: Jamesetta So, MD;  [facility]: [facility];  Service: General;  Laterality: N/A;  . Liver biopsy N/A 05/01/2013    Procedure: LIVER BIOPSY;  Surgeon: Jamesetta So, MD;  [facility];  Service: General;  Laterality:  N/A;  . Eye surgery    . Total knee arthroplasty Left 09/17/2014    Procedure: TOTAL KNEE ARTHROPLASTY;  Surgeon: Vickey Huger, MD;  Location: [place];  Service: Orthopedics;  Laterality: Left;    There were no vitals filed for this visit.      Subjective Assessment - 08/28/15 1048    Subjective  S: I cleaned the pool yesterday is that ok?   Currently in Pain? No/denies            [facility] OT Assessment - 08/28/15 1048    Assessment   Diagnosis Right RCR   Precautions   Precautions Shoulder   Type of Shoulder Precautions Follow standard protocol. Begin AA/ROM and progress to A/ROM and strengthening.                  OT Treatments/Exercises (OP) - 08/28/15 1049    Exercises   Exercises Shoulder   Shoulder Exercises: Supine   Protraction PROM;5 reps;AROM;10 reps   Horizontal ABduction PROM;5 reps;AROM;10 reps   External Rotation PROM;5 reps;AROM;10 reps   Internal Rotation PROM;5 reps;AROM;10 reps   Flexion PROM;5 reps;AROM;10 reps   ABduction PROM;5 reps;AROM;10 reps   Shoulder Exercises: Standing   Protraction AROM;10 reps   Horizontal ABduction AROM;10 reps   External Rotation AROM;10 reps   Internal Rotation AROM;10 reps   Flexion AROM;10  reps   ABduction AROM;10 reps   Extension Theraband;10 reps   Theraband Level (Shoulder Extension) Level 2 (Red)   Row Theraband;10 reps   Theraband Level (Shoulder Row) Level 2 (Red)   Retraction Theraband;10 reps   Theraband Level (Shoulder Retraction) Level 2 (Red)   Shoulder Exercises: ROM/Strengthening   UBE (Upper Arm Bike) Level 1 2' forward 2' reverse   X to V Arms 10X   Proximal Shoulder Strengthening, Supine 10X with no rest breaks   Proximal Shoulder Strengthening, Seated 10X with no rests   Manual Therapy   Manual Therapy Myofascial release   Manual therapy comments Manual therapy completed prior to exercises.    Myofascial Release Myofascial release and manual stretching completed to  RUE upper arm,  trapezius, and scapularis region to decrease fascial restrictions and increase joint mobility in a pain free zone.                 OT Education - 08/28/15 1056    Education provided Yes   Education Details A/ROM exercises   Person(s) Educated Patient   Methods Explanation;Demonstration;Verbal cues;Handout   Comprehension Returned demonstration;Verbalized understanding          OT Short Term Goals - 08/28/15 1114    OT SHORT TERM GOAL #1   Title Pt will be educated on HEP.    Time 4   Period Weeks   OT SHORT TERM GOAL #2   Title Pt will decrease pain to 5/10 or less in RUE to increase ability to perform daily tasks.    Time 4   Period Weeks   OT SHORT TERM GOAL #3   Title Pt will decrease fascial restrictions from mod to min amounts or less in the RUE to increase mobility required for daily task completion.    Time 4   Period Weeks   OT SHORT TERM GOAL #4   Title Pt will increase P/ROM to [facility] in RUE to increase participation in dressing tasks.    Time 4   Period Weeks   OT SHORT TERM GOAL #5   Title Pt will increase RUE strength to 3/5 to increase ability to perform grooming tasks.    Time 4   Period Weeks           OT Long Term Goals - 07/24/15 1216    OT LONG TERM GOAL #1   Title Pt will return to prior level of functioning and independence in daily and leisure tasks using RUE as dominant.    Time 8   Period Weeks   Status On-going   OT LONG TERM GOAL #2   Title Pt will decrease pain in the RUE to 2/10 or less to increase ability to use RUE as dominant during daily tasks.    Time 8   Period Weeks   Status On-going   OT LONG TERM GOAL #3   Title Pt will decrease fascial restrictions in RUE from min to trace amounts or less to improve flexibility required for daily and leisure tasks.    Time 8   Period Weeks   Status On-going   OT LONG TERM GOAL #4   Title Pt will increase A/ROM to [facility] in RUE to improve ability to reach into overhead cabinets during  daily tasks.    Time 8   Period Weeks   Status On-going   OT LONG TERM GOAL #5   Title Pt will increase RUE strength to 4+/5 to improve ability to complete yardwork  tasks.    Time 8   Period Weeks   Status On-going               Plan - 08/28/15 1113    Clinical Impression Statement A: Progressed to A/ROM exercises supine and standing, added X to V arms, scapular theraband exercises, and updated his HEP. VC and physical cues for form and technique during theraband exercises.    Plan P: Add scapular theraband to HEP when appropriate. Add overhead lacing.      Patient will benefit from skilled therapeutic intervention in order to improve the following deficits and impairments:  Decreased strength, Decreased knowledge of precautions, Pain, Impaired UE functional use, Decreased activity tolerance, Decreased range of motion, Increased fascial restricitons, Impaired flexibility  Visit Diagnosis: Other symptoms and signs involving the musculoskeletal system    Problem List Patient Active Problem List   Diagnosis Date Noted  . S/P total knee arthroplasty 09/17/2014  . Leg pain 10/14/2011    Ailene Ravel, OTR/L,CBIS  [phone]  08/28/2015, 11:21 AM  [place], [place], [address] Phone: [phone]   Fax:  [phone]  Name: Harold Nicholson MRN: [id number] Date of Birth: 12-Jun-1950

## 2015-08-28 NOTE — Patient Instructions (Signed)
1) Shoulder Protraction    Begin with elbows by your side, slowly "punch" straight out in front of you keeping arms/elbows straight.      2) Shoulder Flexion  Supine:     Standing:         Begin with arms at your side with thumbs pointed up, slowly raise both arms up and forward towards overhead.               3) Horizontal abduction/adduction  Supine:   Standing:           Begin with arms straight out in front of you, bring out to the side in at "T" shape. Keep arms straight entire time.                 4) Internal & External Rotation    *No band* -Stand with elbows at the side and elbows bent 90 degrees. Move your forearms away from your body, then bring back inward toward the body.     5) Shoulder Abduction  Supine:     Standing:       Lying on your back begin with your arms flat on the table next to your side. Slowly move your arms out to the side so that they go overhead, in a jumping jack or snow angel movement.

## 2015-08-30 ENCOUNTER — Encounter (HOSPITAL_COMMUNITY): Payer: Self-pay | Admitting: Occupational Therapy

## 2015-08-30 ENCOUNTER — Ambulatory Visit (HOSPITAL_COMMUNITY): Payer: PPO | Admitting: Occupational Therapy

## 2015-08-30 DIAGNOSIS — R29898 Other symptoms and signs involving the musculoskeletal system: Secondary | ICD-10-CM

## 2015-08-30 DIAGNOSIS — M25511 Pain in right shoulder: Secondary | ICD-10-CM

## 2015-08-30 NOTE — Therapy (Signed)
[place], [place], [address] Phone: [phone]   Fax:  [phone]  Occupational Therapy Treatment  Patient Details  Name: Harold Nicholson MRN: [id number] Date of Birth: September 05, 1950 Referring Provider: Dr. Charlotte Sanes  Encounter Date: 08/30/2015      OT End of Session - 08/30/15 1110    Visit Number 12   Number of Visits 16   Date for OT Re-Evaluation 09/17/15   Authorization Type [employer]   Authorization Time Period Before 19th visit   Authorization - Visit Number 12   Authorization - Number of Visits 19   OT Start Time 1026   OT Stop Time 1110   OT Time Calculation (min) 44 min   Activity Tolerance Patient tolerated treatment well   Behavior During Therapy [facility] for tasks assessed/performed      Past Medical History  Diagnosis Date  . Hypertension   . Hypercholesteremia   . Hiatal hernia   . Pure hyperglyceridemia   . Thyroid disease     Hypothyroid   . Impaired fasting glucose   . Arthritis   . Hypothyroidism   . Complication of anesthesia     pt stated he stopped breathing during surgery    Past Surgical History  Procedure Laterality Date  . Right knee      X 2  . Left knee arthroscopy    . Colonoscopy  10/07/2010    Procedure: COLONOSCOPY;  Surgeon: Jamesetta So;  [facility];  Service: Gastroenterology;  Laterality: N/A;  . Esophagogastroduodenoscopy  10/07/2010    Procedure: ESOPHAGOGASTRODUODENOSCOPY (EGD);  Surgeon: Jamesetta So;  [facility];  Service: Gastroenterology;  Laterality: N/A;  . Joint replacement      Right knee  . Cholecystectomy    . Cholecystectomy N/A 05/01/2013    Procedure: LAPAROSCOPIC CHOLECYSTECTOMY;  Surgeon: Jamesetta So, MD;  [facility];  Service: General;  Laterality: N/A;  . Liver biopsy N/A 05/01/2013    Procedure: LIVER BIOPSY;  Surgeon: Jamesetta So, MD;  [facility];  Service: General;  Laterality:  N/A;  . Eye surgery    . Total knee arthroplasty Left 09/17/2014    Procedure: TOTAL KNEE ARTHROPLASTY;  Surgeon: Vickey Huger, MD;  Location: [place];  Service: Orthopedics;  Laterality: Left;    There were no vitals filed for this visit.      Subjective Assessment - 08/30/15 1025    Subjective  S: I'm a little sore from those new exercises.    Currently in Pain? No/denies            [facility] OT Assessment - 08/30/15 1025    Assessment   Diagnosis Right RCR   Precautions   Precautions Shoulder   Type of Shoulder Precautions Follow standard protocol. Begin AA/ROM and progress to A/ROM and strengthening.   Shoulder Interventions Shoulder sling/immobilizer;For comfort                  OT Treatments/Exercises (OP) - 08/30/15 1028    Exercises   Exercises Shoulder   Shoulder Exercises: Supine   Protraction PROM;5 reps;AROM;10 reps   Horizontal ABduction PROM;5 reps;AROM;10 reps   External Rotation PROM;5 reps;AROM;10 reps   Internal Rotation PROM;5 reps;AROM;10 reps   Flexion PROM;5 reps;AROM;10 reps   ABduction PROM;5 reps;AROM;10 reps   Shoulder Exercises: Standing   Protraction AROM;10 reps   Horizontal ABduction AROM;10 reps   External Rotation AROM;10 reps  Internal Rotation AROM;10 reps   Flexion AROM;10 reps   ABduction AROM;10 reps   Extension Theraband;10 reps   Theraband Level (Shoulder Extension) Level 2 (Red)   Row Theraband;10 reps   Theraband Level (Shoulder Row) Level 2 (Red)   Retraction Theraband;10 reps   Theraband Level (Shoulder Retraction) Level 2 (Red)   Shoulder Exercises: ROM/Strengthening   UBE (Upper Arm Bike) Level 1 3' forward 3' reverse   Over Head Lace 1'   X to V Arms 10X   Proximal Shoulder Strengthening, Supine 10X with no rest breaks   Proximal Shoulder Strengthening, Seated 10X with no rests  1 rest break   Manual Therapy   Manual Therapy Myofascial release   Manual therapy comments Manual therapy completed prior to  exercises.    Myofascial Release Myofascial release and manual stretching completed to  RUE upper arm, trapezius, and scapularis region to decrease fascial restrictions and increase joint mobility in a pain free zone.                 OT Education - 08/30/15 1109    Education provided Yes   Education Details scapular theraband exercises   Person(s) Educated Patient   Methods Explanation;Demonstration;Handout   Comprehension Verbalized understanding;Returned demonstration          OT Short Term Goals - 08/28/15 1114    OT SHORT TERM GOAL #1   Title Pt will be educated on HEP.    Time 4   Period Weeks   OT SHORT TERM GOAL #2   Title Pt will decrease pain to 5/10 or less in RUE to increase ability to perform daily tasks.    Time 4   Period Weeks   OT SHORT TERM GOAL #3   Title Pt will decrease fascial restrictions from mod to min amounts or less in the RUE to increase mobility required for daily task completion.    Time 4   Period Weeks   OT SHORT TERM GOAL #4   Title Pt will increase P/ROM to [facility] in RUE to increase participation in dressing tasks.    Time 4   Period Weeks   OT SHORT TERM GOAL #5   Title Pt will increase RUE strength to 3/5 to increase ability to perform grooming tasks.    Time 4   Period Weeks           OT Long Term Goals - 07/24/15 1216    OT LONG TERM GOAL #1   Title Pt will return to prior level of functioning and independence in daily and leisure tasks using RUE as dominant.    Time 8   Period Weeks   Status On-going   OT LONG TERM GOAL #2   Title Pt will decrease pain in the RUE to 2/10 or less to increase ability to use RUE as dominant during daily tasks.    Time 8   Period Weeks   Status On-going   OT LONG TERM GOAL #3   Title Pt will decrease fascial restrictions in RUE from min to trace amounts or less to improve flexibility required for daily and leisure tasks.    Time 8   Period Weeks   Status On-going   OT LONG TERM GOAL #4    Title Pt will increase A/ROM to [facility] in RUE to improve ability to reach into overhead cabinets during daily tasks.    Time 8   Period Weeks   Status On-going   OT LONG TERM  GOAL #5   Title Pt will increase RUE strength to 4+/5 to improve ability to complete yardwork tasks.    Time 8   Period Weeks   Status On-going               Plan - 08/30/15 1110    Clinical Impression Statement A: Added overhead lacing, continued A/ROM exercises along with scapular theraband. Pt required intermittent verbal cuing for form, visual and tactile cuing for retraction with scapular theraband. Updated HEP for scapular theraband.    Rehab Potential Good   OT Frequency 2x / week   OT Duration 8 weeks   OT Treatment/Interventions Self-care/ADL training;Ultrasound;DME and/or AE instruction;Passive range of motion;Patient/family education;Cryotherapy;Electrical Stimulation;Moist Heat;Therapeutic exercise;Manual Therapy;Therapeutic activities   Plan P: Take measurements for MD appt. Follow up on scapular theraband HEP. Increase supine repetitions to 12, add w arms   OT Home Exercise Plan scapular theraband HEP   Consulted and Agree with Plan of Care Patient      Patient will benefit from skilled therapeutic intervention in order to improve the following deficits and impairments:  Decreased strength, Decreased knowledge of precautions, Pain, Impaired UE functional use, Decreased activity tolerance, Decreased range of motion, Increased fascial restricitons, Impaired flexibility  Visit Diagnosis: Other symptoms and signs involving the musculoskeletal system  Pain in right shoulder    Problem List Patient Active Problem List   Diagnosis Date Noted  . S/P total knee arthroplasty 09/17/2014  . Leg pain 10/14/2011    Guadelupe Sabin, OTR/L  [phone]  08/30/2015, 11:12 AM  [place], [place], [address] Phone: [phone]   Fax:   [phone]  Name: Harold Nicholson MRN: [id number] Date of Birth: 11-25-1950

## 2015-08-30 NOTE — Patient Instructions (Signed)

## 2015-09-04 ENCOUNTER — Ambulatory Visit (HOSPITAL_COMMUNITY): Payer: PPO

## 2015-09-04 DIAGNOSIS — M25511 Pain in right shoulder: Secondary | ICD-10-CM

## 2015-09-04 DIAGNOSIS — R29898 Other symptoms and signs involving the musculoskeletal system: Secondary | ICD-10-CM

## 2015-09-04 DIAGNOSIS — Z9889 Other specified postprocedural states: Secondary | ICD-10-CM

## 2015-09-04 NOTE — Therapy (Signed)
[place], [place], [address] Phone: [phone]   Fax:  [phone]  Occupational Therapy Treatment  Patient Details  Name: Harold Nicholson MRN: [id number] Date of Birth: 07-23-1950 Referring Provider: Dr. Charlotte Sanes  Encounter Date: 09/04/2015      OT End of Session - 09/04/15 1140    Visit Number 13   Number of Visits 16   Date for OT Re-Evaluation 09/17/15   Authorization Type [employer]   Authorization Time Period Before 19th visit   Authorization - Visit Number 54   Authorization - Number of Visits 19   OT Start Time 1035   OT Stop Time 1115   OT Time Calculation (min) 40 min   Activity Tolerance Patient tolerated treatment well   Behavior During Therapy [facility] for tasks assessed/performed      Past Medical History  Diagnosis Date  . Hypertension   . Hypercholesteremia   . Hiatal hernia   . Pure hyperglyceridemia   . Thyroid disease     Hypothyroid   . Impaired fasting glucose   . Arthritis   . Hypothyroidism   . Complication of anesthesia     pt stated he stopped breathing during surgery    Past Surgical History  Procedure Laterality Date  . Right knee      X 2  . Left knee arthroscopy    . Colonoscopy  10/07/2010    Procedure: COLONOSCOPY;  Surgeon: Jamesetta So;  [facility];  Service: Gastroenterology;  Laterality: N/A;  . Esophagogastroduodenoscopy  10/07/2010    Procedure: ESOPHAGOGASTRODUODENOSCOPY (EGD);  Surgeon: Jamesetta So;  [facility];  Service: Gastroenterology;  Laterality: N/A;  . Joint replacement      Right knee  . Cholecystectomy    . Cholecystectomy N/A 05/01/2013    Procedure: LAPAROSCOPIC CHOLECYSTECTOMY;  Surgeon: Jamesetta So, MD;  [facility];  Service: General;  Laterality: N/A;  . Liver biopsy N/A 05/01/2013    Procedure: LIVER BIOPSY;  Surgeon: Jamesetta So, MD;  [facility];  Service: General;  Laterality:  N/A;  . Eye surgery    . Total knee arthroplasty Left 09/17/2014    Procedure: TOTAL KNEE ARTHROPLASTY;  Surgeon: Vickey Huger, MD;  Location: [place];  Service: Orthopedics;  Laterality: Left;    There were no vitals filed for this visit.      Subjective Assessment - 09/04/15 1148    Currently in Pain? Yes   Pain Score 1    Pain Location Shoulder   Pain Orientation Right   Pain Descriptors / Indicators Sore   Pain Type Acute pain   Pain Radiating Towards N/A   Pain Onset In the past 7 days   Pain Frequency Intermittent   Aggravating Factors  Movement   Pain Relieving Factors ice   Effect of Pain on Daily Activities limited use of RUE during daily tasks   Multiple Pain Sites No            [facility] OT Assessment - 09/04/15 1045    Assessment   Diagnosis Right RCR   Precautions   Precautions Shoulder   Type of Shoulder Precautions Follow standard protocol. Begin AA/ROM and progress to A/ROM and strengthening.   AROM   Overall AROM Comments Assessed  seated. IR/er adducted.    AROM Assessment Site Shoulder   Right/Left Shoulder Right   Right Shoulder Flexion 180 Degrees  previous: 155   Right  Shoulder ABduction 180 Degrees  previous: 155   Right Shoulder Internal Rotation 90 Degrees  previous: same   Right Shoulder External Rotation 90 Degrees  previous: 78   PROM   Overall PROM  Within functional limits for tasks performed   PROM Assessment Site Shoulder   Right/Left Shoulder Right   Strength   Overall Strength Comments Assessed seated. IR/er adducted.    Strength Assessment Site Shoulder   Right/Left Shoulder Right   Right Shoulder Flexion 3+/5  previous; 3+/5   Right Shoulder ABduction 3+/5  previous: 3+/5   Right Shoulder Internal Rotation 4+/5  previous: same   Right Shoulder External Rotation 4-/5  previous: 3+/5                  OT Treatments/Exercises (OP) - 09/04/15 1050    Shoulder Exercises: Supine   Protraction PROM;5 reps   Horizontal  ABduction PROM;5 reps   External Rotation PROM;5 reps   Internal Rotation PROM;5 reps   Flexion PROM;12 reps   ABduction PROM;12 reps   Shoulder Exercises: Prone   Other Prone Exercises [place] exercises; 10X   Other Prone Exercises scapular raises; 10X   Shoulder Exercises: Standing   Protraction AROM;12 reps   Horizontal ABduction AROM;12 reps   External Rotation AROM;Theraband;12 reps   Theraband Level (Shoulder External Rotation) Level 2 (Red)   Internal Rotation AROM;12 reps   Flexion AROM;12 reps   ABduction AROM;12 reps   Extension Theraband;12 reps   Theraband Level (Shoulder Extension) Level 2 (Red)   Row Theraband;12 reps   Theraband Level (Shoulder Row) Level 2 (Red)   Retraction Theraband;12 reps   Theraband Level (Shoulder Retraction) Level 2 (Red)   Shoulder Exercises: ROM/Strengthening   UBE (Upper Arm Bike) Level 1 3' forward 3' reverse   X to V Arms 12X   Proximal Shoulder Strengthening, Seated 12X with no rest breaks   Ball on Wall 1' flexion 1' abduction green ball   Manual Therapy   Manual Therapy Myofascial release   Manual therapy comments Manual therapy completed prior to exercises.    Myofascial Release Myofascial release and manual stretching completed to  RUE upper arm, trapezius, and scapularis region to decrease fascial restrictions and increase joint mobility in a pain free zone.                   OT Short Term Goals - 08/28/15 1114    OT SHORT TERM GOAL #1   Title Pt will be educated on HEP.    Time 4   Period Weeks   OT SHORT TERM GOAL #2   Title Pt will decrease pain to 5/10 or less in RUE to increase ability to perform daily tasks.    Time 4   Period Weeks   OT SHORT TERM GOAL #3   Title Pt will decrease fascial restrictions from mod to min amounts or less in the RUE to increase mobility required for daily task completion.    Time 4   Period Weeks   OT SHORT TERM GOAL #4   Title Pt will increase P/ROM to [facility] in RUE to  increase participation in dressing tasks.    Time 4   Period Weeks   OT SHORT TERM GOAL #5   Title Pt will increase RUE strength to 3/5 to increase ability to perform grooming tasks.    Time 4   Period Weeks           OT Long Term Goals -  07/24/15 1216    OT LONG TERM GOAL #1   Title Pt will return to prior level of functioning and independence in daily and leisure tasks using RUE as dominant.    Time 8   Period Weeks   Status On-going   OT LONG TERM GOAL #2   Title Pt will decrease pain in the RUE to 2/10 or less to increase ability to use RUE as dominant during daily tasks.    Time 8   Period Weeks   Status On-going   OT LONG TERM GOAL #3   Title Pt will decrease fascial restrictions in RUE from min to trace amounts or less to improve flexibility required for daily and leisure tasks.    Time 8   Period Weeks   Status On-going   OT LONG TERM GOAL #4   Title Pt will increase A/ROM to [facility] in RUE to improve ability to reach into overhead cabinets during daily tasks.    Time 8   Period Weeks   Status On-going   OT LONG TERM GOAL #5   Title Pt will increase RUE strength to 4+/5 to improve ability to complete yardwork tasks.    Time 8   Period Weeks   Status On-going               Plan - 09/04/15 1141    Clinical Impression Statement A: Measurements taken for MD appointment. Patient was given print out of measurements to take to MD appointment. Added prone exercises to focus on stability of shoulder and scapular region. VC for form and technique.   Plan P: Continue to focus on shoulder and scapular stability.       Patient will benefit from skilled therapeutic intervention in order to improve the following deficits and impairments:  Decreased strength, Decreased knowledge of precautions, Pain, Impaired UE functional use, Decreased activity tolerance, Decreased range of motion, Increased fascial restricitons, Impaired flexibility  Visit Diagnosis: Other symptoms  and signs involving the musculoskeletal system  Pain in right shoulder  Status post rotator cuff repair    Problem List Patient Active Problem List   Diagnosis Date Noted  . S/P total knee arthroplasty 09/17/2014  . Leg pain 10/14/2011    Ailene Ravel, OTR/L,CBIS  [phone]  09/04/2015, 11:50 AM  [place], [place], [address] Phone: [phone]   Fax:  [phone]  Name: Harold Nicholson MRN: [id number] Date of Birth: Dec 13, 1950

## 2015-09-06 ENCOUNTER — Encounter (HOSPITAL_COMMUNITY): Payer: Self-pay | Admitting: Occupational Therapy

## 2015-09-06 ENCOUNTER — Ambulatory Visit (HOSPITAL_COMMUNITY): Payer: PPO | Admitting: Occupational Therapy

## 2015-09-06 DIAGNOSIS — R29898 Other symptoms and signs involving the musculoskeletal system: Secondary | ICD-10-CM | POA: Diagnosis not present

## 2015-09-06 DIAGNOSIS — M25511 Pain in right shoulder: Secondary | ICD-10-CM

## 2015-09-06 NOTE — Therapy (Signed)
[place], [place], [address] Phone: [phone]   Fax:  [phone]  Occupational Therapy Treatment  Patient Details  Name: Harold Nicholson MRN: [id number] Date of Birth: [birth year] Referring Provider: Dr. Charlotte Sanes  Encounter Date: 09/06/2015      OT End of Session - 09/06/15 1247    Visit Number 14   Number of Visits 16   Date for OT Re-Evaluation 09/17/15   Authorization Type [employer]   Authorization Time Period Before 19th visit   Authorization - Visit Number 37   Authorization - Number of Visits 19   OT Start Time 1035   OT Stop Time 1114   OT Time Calculation (min) 39 min   Activity Tolerance Patient tolerated treatment well   Behavior During Therapy [facility] for tasks assessed/performed      Past Medical History  Diagnosis Date  . Hypertension   . Hypercholesteremia   . Hiatal hernia   . Pure hyperglyceridemia   . Thyroid disease     Hypothyroid   . Impaired fasting glucose   . Arthritis   . Hypothyroidism   . Complication of anesthesia     pt stated he stopped breathing during surgery    Past Surgical History  Procedure Laterality Date  . Right knee      X 2  . Left knee arthroscopy    . Colonoscopy  10/07/2010    Procedure: COLONOSCOPY;  Surgeon: Jamesetta So;  [facility];  Service: Gastroenterology;  Laterality: N/A;  . Esophagogastroduodenoscopy  10/07/2010    Procedure: ESOPHAGOGASTRODUODENOSCOPY (EGD);  Surgeon: Jamesetta So;  [facility];  Service: Gastroenterology;  Laterality: N/A;  . Joint replacement      Right knee  . Cholecystectomy    . Cholecystectomy N/A 05/01/2013    Procedure: LAPAROSCOPIC CHOLECYSTECTOMY;  Surgeon: Jamesetta So, MD;  Location: [facility];  Service: General;  Laterality: N/A;  . Liver biopsy N/A 05/01/2013    Procedure: LIVER BIOPSY;  Surgeon: Jamesetta So, MD;  Location: [facility];  Service: General;  Laterality:  N/A;  . Eye surgery    . Total knee arthroplasty Left 09/17/2014    Procedure: TOTAL KNEE ARTHROPLASTY;  Surgeon: Vickey Huger, MD;  Location: [place];  Service: Orthopedics;  Laterality: Left;    There were no vitals filed for this visit.      Subjective Assessment - 09/06/15 1047    Subjective  S: I went to the doctor yesterday and now I'm confused.    Currently in Pain? Yes   Pain Score 1    Pain Location Shoulder   Pain Orientation Right   Pain Descriptors / Indicators Sore   Pain Type Acute pain   Pain Radiating Towards n/a   Pain Onset More than a month ago   Pain Frequency Intermittent   Aggravating Factors  end range movement   Pain Relieving Factors ice   Effect of Pain on Daily Activities limited use of RUE during daily tasks   Multiple Pain Sites No            [facility] OT Assessment - 09/06/15 1034    Assessment   Diagnosis Right RCR   Precautions   Precautions Shoulder   Type of Shoulder Precautions Follow standard protocol. Begin AA/ROM and progress to A/ROM and strengthening.                  OT Treatments/Exercises (OP) -  09/06/15 1056    Exercises   Exercises Shoulder   Shoulder Exercises: Supine   Protraction PROM;5 reps;AROM;12 reps   Horizontal ABduction PROM;5 reps;AROM;12 reps   External Rotation PROM;5 reps;AROM;12 reps   Internal Rotation PROM;5 reps;AROM;12 reps   Flexion PROM;5 reps;AROM;12 reps   ABduction PROM;5 reps;AROM;12 reps   Shoulder Exercises: Prone   Other Prone Exercises Hughston exercises; 10X   Shoulder Exercises: Standing   Protraction AROM;12 reps   Horizontal ABduction AROM;12 reps   External Rotation AROM;12 reps   Internal Rotation AROM;12 reps   Flexion AROM;12 reps   ABduction AROM;12 reps   Shoulder Exercises: ROM/Strengthening   "W" Arms 10X   X to V Arms 12X   Proximal Shoulder Strengthening, Supine 12X with no rest breaks   Proximal Shoulder Strengthening, Seated 12X with no rest breaks                   OT Short Term Goals - 08/28/15 1114    OT SHORT TERM GOAL #1   Title Pt will be educated on HEP.    Time 4   Period Weeks   OT SHORT TERM GOAL #2   Title Pt will decrease pain to 5/10 or less in RUE to increase ability to perform daily tasks.    Time 4   Period Weeks   OT SHORT TERM GOAL #3   Title Pt will decrease fascial restrictions from mod to min amounts or less in the RUE to increase mobility required for daily task completion.    Time 4   Period Weeks   OT SHORT TERM GOAL #4   Title Pt will increase P/ROM to [facility] in RUE to increase participation in dressing tasks.    Time 4   Period Weeks   OT SHORT TERM GOAL #5   Title Pt will increase RUE strength to 3/5 to increase ability to perform grooming tasks.    Time 4   Period Weeks           OT Long Term Goals - 07/24/15 1216    OT LONG TERM GOAL #1   Title Pt will return to prior level of functioning and independence in daily and leisure tasks using RUE as dominant.    Time 8   Period Weeks   Status On-going   OT LONG TERM GOAL #2   Title Pt will decrease pain in the RUE to 2/10 or less to increase ability to use RUE as dominant during daily tasks.    Time 8   Period Weeks   Status On-going   OT LONG TERM GOAL #3   Title Pt will decrease fascial restrictions in RUE from min to trace amounts or less to improve flexibility required for daily and leisure tasks.    Time 8   Period Weeks   Status On-going   OT LONG TERM GOAL #4   Title Pt will increase A/ROM to [facility] in RUE to improve ability to reach into overhead cabinets during daily tasks.    Time 8   Period Weeks   Status On-going   OT LONG TERM GOAL #5   Title Pt will increase RUE strength to 4+/5 to improve ability to complete yardwork tasks.    Time 8   Period Weeks   Status On-going               Plan - 09/06/15 1247    Clinical Impression Statement A: Pt went to MD for follow-up  and reports MD upset that pt is moving arm  volitionally with A/ROM. Pt reports MD stated "you've torn it" when pt shook MD's hand. Pt is now 12 weeks out from surgery, new orders states to continue A/ROM with updated HEPs. Pt is not experiencing any pain aside from expected soreness after completing exercises, resolves within "a few hours." Continued A/ROM this session, intermittent verbal cuing for form.    Rehab Potential Good   OT Frequency 2x / week   OT Duration 8 weeks   OT Treatment/Interventions Self-care/ADL training;Ultrasound;DME and/or AE instruction;Passive range of motion;Patient/family education;Cryotherapy;Electrical Stimulation;Moist Heat;Therapeutic exercise;Manual Therapy;Therapeutic activities   Plan P: Continue A/ROM, focusing on shoulder and scapular stability, update HEP as necessary.    Consulted and Agree with Plan of Care Patient      Patient will benefit from skilled therapeutic intervention in order to improve the following deficits and impairments:  Decreased strength, Decreased knowledge of precautions, Pain, Impaired UE functional use, Decreased activity tolerance, Decreased range of motion, Increased fascial restricitons, Impaired flexibility  Visit Diagnosis: Other symptoms and signs involving the musculoskeletal system  Pain in right shoulder    Problem List Patient Active Problem List   Diagnosis Date Noted  . S/P total knee arthroplasty 09/17/2014  . Leg pain 10/14/2011    Guadelupe Sabin, OTR/L  [phone]  09/06/2015, 12:52 PM  [place], [place], [address] Phone: [phone]   Fax:  [phone]  Name: Harold Nicholson MRN: [id number] Date of Birth: [birth year]

## 2015-09-11 ENCOUNTER — Ambulatory Visit (HOSPITAL_COMMUNITY): Payer: PPO

## 2015-09-11 ENCOUNTER — Encounter (HOSPITAL_COMMUNITY): Payer: Self-pay

## 2015-09-11 DIAGNOSIS — R29898 Other symptoms and signs involving the musculoskeletal system: Secondary | ICD-10-CM

## 2015-09-11 DIAGNOSIS — M25511 Pain in right shoulder: Secondary | ICD-10-CM

## 2015-09-11 NOTE — Therapy (Signed)
[place], [place], [address] Phone: [phone]   Fax:  [phone]  Occupational Therapy Treatment  Patient Details  Name: Harold Nicholson MRN: [id number] Date of Birth: February 22, 1950 Referring Provider: Dr. Charlotte Sanes  Encounter Date: 09/11/2015      OT End of Session - 09/11/15 1108    Visit Number 15   Number of Visits 16   Date for OT Re-Evaluation 09/17/15   Authorization Type [employer]   Authorization Time Period Before 19th visit   Authorization - Visit Number 15   Authorization - Number of Visits 19   OT Start Time 1033   OT Stop Time 1117   OT Time Calculation (min) 44 min   Activity Tolerance Patient tolerated treatment well   Behavior During Therapy [facility] for tasks assessed/performed      Past Medical History:  Diagnosis Date  . Arthritis   . Complication of anesthesia    pt stated he stopped breathing during surgery  . Hiatal hernia   . Hypercholesteremia   . Hypertension   . Hypothyroidism   . Impaired fasting glucose   . Pure hyperglyceridemia   . Thyroid disease    Hypothyroid     Past Surgical History:  Procedure Laterality Date  . CHOLECYSTECTOMY    . CHOLECYSTECTOMY N/A 05/01/2013   Procedure: LAPAROSCOPIC CHOLECYSTECTOMY;  Surgeon: Jamesetta So, MD;  [facility];  Service: General;  Laterality: N/A;  . COLONOSCOPY  10/07/2010   Procedure: COLONOSCOPY;  Surgeon: Jamesetta So;  [facility];  Service: Gastroenterology;  Laterality: N/A;  . ESOPHAGOGASTRODUODENOSCOPY  10/07/2010   Procedure: ESOPHAGOGASTRODUODENOSCOPY (EGD);  Surgeon: Jamesetta So;  [facility];  Service: Gastroenterology;  Laterality: N/A;  . EYE SURGERY    . JOINT REPLACEMENT     Right knee  . Left Knee Arthroscopy    . LIVER BIOPSY N/A 05/01/2013   Procedure: LIVER BIOPSY;  Surgeon: Jamesetta So, MD;  [facility];  Service: General;  Laterality: N/A;  .  right knee     X 2  . TOTAL KNEE ARTHROPLASTY Left 09/17/2014   Procedure: TOTAL KNEE ARTHROPLASTY;  Surgeon: Vickey Huger, MD;  Location: [place];  Service: Orthopedics;  Laterality: Left;    There were no vitals filed for this visit.      Subjective Assessment - 09/11/15 1055    Subjective  S: When i wake up in the morning is a little stiff but it loosens up after a while.    Currently in Pain? No/denies            [facility] OT Assessment - 09/11/15 1056      Assessment   Diagnosis Right RCR     Precautions   Precautions Shoulder   Type of Shoulder Precautions Follow standard protocol. Begin AA/ROM and progress to A/ROM and strengthening.                  OT Treatments/Exercises (OP) - 09/11/15 1057      Exercises   Exercises Shoulder     Shoulder Exercises: Supine   Protraction PROM;5 reps;AROM;12 reps   Horizontal ABduction PROM;5 reps;AROM;12 reps   External Rotation PROM;5 reps;AROM;12 reps   Internal Rotation PROM;5 reps;AROM;12 reps   Flexion PROM;5 reps;AROM;12 reps   ABduction PROM;5 reps;AROM;12 reps     Shoulder Exercises: Standing   Protraction AROM;12 reps   Horizontal ABduction AROM;12 reps   External Rotation  AROM;12 reps   Internal Rotation AROM;12 reps   Flexion AROM;12 reps   ABduction AROM;12 reps   Extension Theraband;12 reps   Theraband Level (Shoulder Extension) Level 2 (Red)   Row Theraband;12 reps   Theraband Level (Shoulder Row) Level 3 (Green)   Retraction Theraband;12 reps   Theraband Level (Shoulder Retraction) Level 2 (Red)     Shoulder Exercises: ROM/Strengthening   UBE (Upper Arm Bike) Level 2 3' forward 3' reverse   Over Head Lace 2'   "W" Arms 10X   X to V Arms 12X   Proximal Shoulder Strengthening, Supine 12X with no rest breaks   Proximal Shoulder Strengthening, Seated 12X with no rest breaks   Ball on Wall 1' flexion 1' abduction green ball     Manual Therapy   Manual Therapy Myofascial release   Manual therapy  comments Manual therapy completed prior to exercises.    Myofascial Release Myofascial release and manual stretching completed to  RUE upper arm, trapezius, and scapularis region to decrease fascial restrictions and increase joint mobility in a pain free zone.                   OT Short Term Goals - 08/28/15 1114      OT SHORT TERM GOAL #1   Title Pt will be educated on HEP.    Time 4   Period Weeks     OT SHORT TERM GOAL #2   Title Pt will decrease pain to 5/10 or less in RUE to increase ability to perform daily tasks.    Time 4   Period Weeks     OT SHORT TERM GOAL #3   Title Pt will decrease fascial restrictions from mod to min amounts or less in the RUE to increase mobility required for daily task completion.    Time 4   Period Weeks     OT SHORT TERM GOAL #4   Title Pt will increase P/ROM to [facility] in RUE to increase participation in dressing tasks.    Time 4   Period Weeks     OT SHORT TERM GOAL #5   Title Pt will increase RUE strength to 3/5 to increase ability to perform grooming tasks.    Time 4   Period Weeks           OT Long Term Goals - 07/24/15 1216      OT LONG TERM GOAL #1   Title Pt will return to prior level of functioning and independence in daily and leisure tasks using RUE as dominant.    Time 8   Period Weeks   Status On-going     OT LONG TERM GOAL #2   Title Pt will decrease pain in the RUE to 2/10 or less to increase ability to use RUE as dominant during daily tasks.    Time 8   Period Weeks   Status On-going     OT LONG TERM GOAL #3   Title Pt will decrease fascial restrictions in RUE from min to trace amounts or less to improve flexibility required for daily and leisure tasks.    Time 8   Period Weeks   Status On-going     OT LONG TERM GOAL #4   Title Pt will increase A/ROM to [facility] in RUE to improve ability to reach into overhead cabinets during daily tasks.    Time 8   Period Weeks   Status On-going     OT LONG TERM  GOAL  #5   Title Pt will increase RUE strength to 4+/5 to improve ability to complete yardwork tasks.    Time 8   Period Weeks   Status On-going               Plan - 09/11/15 1108    Clinical Impression Statement A: Continueed with A/ROM exercises and required min VC for form and technique.    Plan P: Progress to 1# weight as long as MD approves to begin strengthening.       Patient will benefit from skilled therapeutic intervention in order to improve the following deficits and impairments:  Decreased strength, Decreased knowledge of precautions, Pain, Impaired UE functional use, Decreased activity tolerance, Decreased range of motion, Increased fascial restricitons, Impaired flexibility  Visit Diagnosis: Other symptoms and signs involving the musculoskeletal system  Pain in right shoulder    Problem List Patient Active Problem List   Diagnosis Date Noted  . S/P total knee arthroplasty 09/17/2014  . Leg pain 10/14/2011   Ailene Ravel, OTR/L,CBIS  [phone]  09/11/2015, 12:05 PM  [place] [address] [place], [place], [address] Phone: [phone]   Fax:  [phone]  Name: Harold Nicholson MRN: [id number] Date of Birth: 12-05-1950

## 2015-09-13 ENCOUNTER — Ambulatory Visit (HOSPITAL_COMMUNITY): Payer: PPO | Admitting: Occupational Therapy

## 2015-09-13 ENCOUNTER — Encounter (HOSPITAL_COMMUNITY): Payer: Self-pay | Admitting: Occupational Therapy

## 2015-09-13 DIAGNOSIS — R29898 Other symptoms and signs involving the musculoskeletal system: Secondary | ICD-10-CM

## 2015-09-13 DIAGNOSIS — M25511 Pain in right shoulder: Secondary | ICD-10-CM

## 2015-09-13 NOTE — Therapy (Signed)
[place], [place], [address] Phone: [phone]   Fax:  [phone]  Occupational Therapy Treatment  Patient Details  Name: Harold Nicholson MRN: [id number] Date of Birth: [birth year] Referring Provider: Dr. Charlotte Sanes  Encounter Date: 09/13/2015      OT End of Session - 09/13/15 1115    Visit Number 16   Number of Visits 17   Date for OT Re-Evaluation 09/17/15   Authorization Type [employer]   Authorization Time Period Before 19th visit   Authorization - Visit Number 16   Authorization - Number of Visits 19   OT Start Time 1031   OT Stop Time 1116   OT Time Calculation (min) 45 min   Activity Tolerance Patient tolerated treatment well   Behavior During Therapy [facility] for tasks assessed/performed      Past Medical History:  Diagnosis Date  . Arthritis   . Complication of anesthesia    pt stated he stopped breathing during surgery  . Hiatal hernia   . Hypercholesteremia   . Hypertension   . Hypothyroidism   . Impaired fasting glucose   . Pure hyperglyceridemia   . Thyroid disease    Hypothyroid     Past Surgical History:  Procedure Laterality Date  . CHOLECYSTECTOMY    . CHOLECYSTECTOMY N/A 05/01/2013   Procedure: LAPAROSCOPIC CHOLECYSTECTOMY;  Surgeon: Jamesetta So, MD;  [facility];  Service: General;  Laterality: N/A;  . COLONOSCOPY  10/07/2010   Procedure: COLONOSCOPY;  Surgeon: Jamesetta So;  [facility];  Service: Gastroenterology;  Laterality: N/A;  . ESOPHAGOGASTRODUODENOSCOPY  10/07/2010   Procedure: ESOPHAGOGASTRODUODENOSCOPY (EGD);  Surgeon: Jamesetta So;  [facility];  Service: Gastroenterology;  Laterality: N/A;  . EYE SURGERY    . JOINT REPLACEMENT     Right knee  . Left Knee Arthroscopy    . LIVER BIOPSY N/A 05/01/2013   Procedure: LIVER BIOPSY;  Surgeon: Jamesetta So, MD;  [facility];  Service: General;  Laterality: N/A;  .  right knee     X 2  . TOTAL KNEE ARTHROPLASTY Left 09/17/2014   Procedure: TOTAL KNEE ARTHROPLASTY;  Surgeon: Vickey Huger, MD;  Location: [place];  Service: Orthopedics;  Laterality: Left;    There were no vitals filed for this visit.      Subjective Assessment - 09/13/15 1035    Subjective  S: I put a gallon of tea in the refrigerator this morning with no trouble.    Currently in Pain? No/denies            [facility] OT Assessment - 09/13/15 1033      Assessment   Diagnosis Right RCR     Precautions   Precautions Shoulder   Type of Shoulder Precautions Follow standard protocol. Begin AA/ROM and progress to A/ROM and strengthening.                  OT Treatments/Exercises (OP) - 09/13/15 1046      Exercises   Exercises Shoulder     Shoulder Exercises: Supine   Protraction PROM;5 reps;AROM;15 reps   Horizontal ABduction PROM;5 reps;AROM;15 reps   External Rotation PROM;5 reps;AROM;15 reps   Internal Rotation PROM;5 reps;AROM;15 reps   Flexion PROM;5 reps;AROM;15 reps   ABduction PROM;5 reps;AROM;15 reps     Shoulder Exercises: Prone   Other Prone Exercises Hughston exercises; 10X     Shoulder Exercises: Standing   Protraction AROM;15  reps   Horizontal ABduction AROM;15 reps  2 rest breaks   External Rotation AROM;15 reps   Internal Rotation AROM;15 reps   Flexion AROM;15 reps   ABduction AROM;15 reps   Extension Theraband;15 reps   Theraband Level (Shoulder Extension) Level 2 (Red)   Row Theraband;15 reps   Theraband Level (Shoulder Row) Level 2 (Red)   Retraction Theraband;15 reps   Theraband Level (Shoulder Retraction) Level 2 (Red)     Shoulder Exercises: ROM/Strengthening   UBE (Upper Arm Bike) Level 2 3' forward 3' reverse   X to V Arms 12X   Proximal Shoulder Strengthening, Supine 15X with no rest breaks   Proximal Shoulder Strengthening, Seated 12X with no rest breaks   Ball on Wall 1' flexion 1' abduction green ball     Manual Therapy    Manual Therapy Myofascial release   Manual therapy comments Manual therapy completed prior to exercises.    Myofascial Release Myofascial release and manual stretching completed to  RUE upper arm, trapezius, and scapularis region to decrease fascial restrictions and increase joint mobility in a pain free zone.                   OT Short Term Goals - 08/28/15 1114      OT SHORT TERM GOAL #1   Title Pt will be educated on HEP.    Time 4   Period Weeks     OT SHORT TERM GOAL #2   Title Pt will decrease pain to 5/10 or less in RUE to increase ability to perform daily tasks.    Time 4   Period Weeks     OT SHORT TERM GOAL #3   Title Pt will decrease fascial restrictions from mod to min amounts or less in the RUE to increase mobility required for daily task completion.    Time 4   Period Weeks     OT SHORT TERM GOAL #4   Title Pt will increase P/ROM to [facility] in RUE to increase participation in dressing tasks.    Time 4   Period Weeks     OT SHORT TERM GOAL #5   Title Pt will increase RUE strength to 3/5 to increase ability to perform grooming tasks.    Time 4   Period Weeks           OT Long Term Goals - 07/24/15 1216      OT LONG TERM GOAL #1   Title Pt will return to prior level of functioning and independence in daily and leisure tasks using RUE as dominant.    Time 8   Period Weeks   Status On-going     OT LONG TERM GOAL #2   Title Pt will decrease pain in the RUE to 2/10 or less to increase ability to use RUE as dominant during daily tasks.    Time 8   Period Weeks   Status On-going     OT LONG TERM GOAL #3   Title Pt will decrease fascial restrictions in RUE from min to trace amounts or less to improve flexibility required for daily and leisure tasks.    Time 8   Period Weeks   Status On-going     OT LONG TERM GOAL #4   Title Pt will increase A/ROM to [facility] in RUE to improve ability to reach into overhead cabinets during daily tasks.    Time 8    Period Weeks   Status On-going  OT LONG TERM GOAL #5   Title Pt will increase RUE strength to 4+/5 to improve ability to complete yardwork tasks.    Time 8   Period Weeks   Status On-going               Plan - 09/13/15 1116    Clinical Impression Statement A: Contacted MD to inquire about strengthening, MD orders are to continue A/ROM at this time. Resumed prone hughston exercises, continued with A/ROM increasing repetitions to 15 in supine and standing. Pt with occasional verbal cuing for form, intermittent rest breaks due to fatigue. Pt reports he picked up a gallon of tea this morning and placed in the refrigerator with his RUE with no pain or weakness.    Rehab Potential Good   OT Frequency 2x / week   OT Duration 8 weeks   OT Treatment/Interventions Self-care/ADL training;Ultrasound;DME and/or AE instruction;Passive range of motion;Patient/family education;Cryotherapy;Electrical Stimulation;Moist Heat;Therapeutic exercise;Manual Therapy;Therapeutic activities   Plan P: Reassessment. Continue with A/ROM plan of care, focusing on form and activity tolerance.       Patient will benefit from skilled therapeutic intervention in order to improve the following deficits and impairments:  Decreased strength, Decreased knowledge of precautions, Pain, Impaired UE functional use, Decreased activity tolerance, Decreased range of motion, Increased fascial restricitons, Impaired flexibility  Visit Diagnosis: Other symptoms and signs involving the musculoskeletal system  Pain in right shoulder    Problem List Patient Active Problem List   Diagnosis Date Noted  . S/P total knee arthroplasty 09/17/2014  . Leg pain 10/14/2011     Guadelupe Sabin, OTR/L  [phone] 09/13/2015, 11:19 AM  [place], [place], [address] Phone: [phone]   Fax:  [phone]  Name: TIRAN KOCHEL MRN: [id number] Date of Birth:  16-Dec-1950

## 2015-09-18 ENCOUNTER — Encounter (HOSPITAL_COMMUNITY): Payer: Self-pay | Admitting: Occupational Therapy

## 2015-09-18 ENCOUNTER — Ambulatory Visit (HOSPITAL_COMMUNITY): Payer: PPO | Attending: Orthopedic Surgery | Admitting: Occupational Therapy

## 2015-09-18 DIAGNOSIS — M25511 Pain in right shoulder: Secondary | ICD-10-CM | POA: Diagnosis present

## 2015-09-18 DIAGNOSIS — R29898 Other symptoms and signs involving the musculoskeletal system: Secondary | ICD-10-CM | POA: Insufficient documentation

## 2015-09-18 NOTE — Therapy (Signed)
[place], [place], [address] Phone: [phone]   Fax:  [phone]  Occupational Therapy Reassessment and Recertification  Patient Details  Name: Harold Nicholson MRN: [id number] Date of Birth: 1950-07-29 Referring Provider: Dr. Charlotte Sanes  Encounter Date: 09/18/2015      OT End of Session - 09/18/15 1208    Visit Number 17   Number of Visits 25   Date for OT Re-Evaluation 10/16/15   Authorization Type [employer]   Authorization Time Period Before 19th visit   Authorization - Visit Number 65   Authorization - Number of Visits 19   OT Start Time 1123   OT Stop Time 1209   OT Time Calculation (min) 46 min   Activity Tolerance Patient tolerated treatment well   Behavior During Therapy [facility] for tasks assessed/performed      Past Medical History:  Diagnosis Date  . Arthritis   . Complication of anesthesia    pt stated he stopped breathing during surgery  . Hiatal hernia   . Hypercholesteremia   . Hypertension   . Hypothyroidism   . Impaired fasting glucose   . Pure hyperglyceridemia   . Thyroid disease    Hypothyroid     Past Surgical History:  Procedure Laterality Date  . CHOLECYSTECTOMY    . CHOLECYSTECTOMY N/A 05/01/2013   Procedure: LAPAROSCOPIC CHOLECYSTECTOMY;  Surgeon: Jamesetta So, MD;  [facility];  Service: General;  Laterality: N/A;  . COLONOSCOPY  10/07/2010   Procedure: COLONOSCOPY;  Surgeon: Jamesetta So;  [facility];  Service: Gastroenterology;  Laterality: N/A;  . ESOPHAGOGASTRODUODENOSCOPY  10/07/2010   Procedure: ESOPHAGOGASTRODUODENOSCOPY (EGD);  Surgeon: Jamesetta So;  [facility];  Service: Gastroenterology;  Laterality: N/A;  . EYE SURGERY    . JOINT REPLACEMENT     Right knee  . Left Knee Arthroscopy    . LIVER BIOPSY N/A 05/01/2013   Procedure: LIVER BIOPSY;  Surgeon: Jamesetta So, MD;  [facility]: [facility];  Service: General;   Laterality: N/A;  . right knee     X 2  . TOTAL KNEE ARTHROPLASTY Left 09/17/2014   Procedure: TOTAL KNEE ARTHROPLASTY;  Surgeon: Vickey Huger, MD;  Location: [facility];  Service: Orthopedics;  Laterality: Left;    There were no vitals filed for this visit.      Subjective Assessment - 09/18/15 1116    Subjective  S: I didn't get sore like I thought I might after last time.    Currently in Pain? No/denies           [facility] OT Assessment - 09/18/15 1115      Assessment   Diagnosis Right RCR     Precautions   Precautions Shoulder   Type of Shoulder Precautions Follow standard protocol. Begin AA/ROM and progress to A/ROM and strengthening.     AROM   Overall AROM Comments Assessed  seated. IR/er adducted.    AROM Assessment Site Shoulder   Right/Left Shoulder Right   Right Shoulder Flexion 180 Degrees  same as previous   Right Shoulder ABduction 180 Degrees  same as previous    Right Shoulder Internal Rotation 90 Degrees  same as previous   Right Shoulder External Rotation 90 Degrees  same as previous     PROM   Overall PROM  Within functional limits for tasks performed   PROM Assessment Site Shoulder   Right/Left Shoulder Right     Strength  Overall Strength Comments Assessed seated. IR/er adducted.    Strength Assessment Site Shoulder   Right/Left Shoulder Right   Right Shoulder Flexion 4/5  3+/5 previous   Right Shoulder ABduction 4/5  3+/5 previous   Right Shoulder Internal Rotation 4+/5  same as previous   Right Shoulder External Rotation 4-/5  same as previous                  OT Treatments/Exercises (OP) - 09/18/15 1126      Exercises   Exercises Shoulder     Shoulder Exercises: Supine   Protraction PROM;5 reps;AROM;15 reps   Horizontal ABduction PROM;5 reps;AROM;15 reps   External Rotation PROM;5 reps;AROM;15 reps   External Rotation Limitations abducted   Internal Rotation PROM;5 reps;AROM;15 reps   Internal Rotation Limitations abducted    Flexion PROM;5 reps;AROM;15 reps   ABduction PROM;5 reps;AROM;15 reps     Shoulder Exercises: Standing   Protraction AROM;15 reps   Horizontal ABduction AROM;15 reps   External Rotation AROM;15 reps   Internal Rotation AROM;15 reps   Flexion AROM;15 reps   ABduction AROM;15 reps   Extension Theraband;15 reps   Theraband Level (Shoulder Extension) Level 3 (Green)   Row [employer] reps   Theraband Level (Shoulder Row) Level 3 (Green)   Retraction Theraband;15 reps   Theraband Level (Shoulder Retraction) Level 3 (Green)     Shoulder Exercises: ROM/Strengthening   UBE (Upper Arm Bike) Level 2 3' forward 3' reverse   "W" Arms 10X   X to V Arms 12X   Proximal Shoulder Strengthening, Supine 15X with no rest breaks   Proximal Shoulder Strengthening, Seated 15X each no rest breaks     Manual Therapy   Manual Therapy Myofascial release   Manual therapy comments Manual therapy completed prior to exercises.    Myofascial Release Myofascial release and manual stretching completed to  RUE upper arm, trapezius, and scapularis region to decrease fascial restrictions and increase joint mobility in a pain free zone.                  OT Short Term Goals - 08/28/15 1114      OT SHORT TERM GOAL #1   Title Pt will be educated on HEP.    Time 4   Period Weeks     OT SHORT TERM GOAL #2   Title Pt will decrease pain to 5/10 or less in RUE to increase ability to perform daily tasks.    Time 4   Period Weeks     OT SHORT TERM GOAL #3   Title Pt will decrease fascial restrictions from mod to min amounts or less in the RUE to increase mobility required for daily task completion.    Time 4   Period Weeks     OT SHORT TERM GOAL #4   Title Pt will increase P/ROM to [facility] in RUE to increase participation in dressing tasks.    Time 4   Period Weeks     OT SHORT TERM GOAL #5   Title Pt will increase RUE strength to 3/5 to increase ability to perform grooming tasks.    Time 4   Period  Weeks           OT Long Term Goals - 09/18/15 1204      OT LONG TERM GOAL #1   Title Pt will return to prior level of functioning and independence in daily and leisure tasks using RUE as dominant.    Time  8   Period Weeks   Status On-going     OT LONG TERM GOAL #2   Title Pt will decrease pain in the RUE to 2/10 or less to increase ability to use RUE as dominant during daily tasks.    Time 8   Period Weeks   Status Achieved     OT LONG TERM GOAL #3   Title Pt will decrease fascial restrictions in RUE from min to trace amounts or less to improve flexibility required for daily and leisure tasks.    Time 8   Period Weeks   Status On-going     OT LONG TERM GOAL #4   Title Pt will increase A/ROM to [facility] in RUE to improve ability to reach into overhead cabinets during daily tasks.    Time 8   Period Weeks   Status Achieved     OT LONG TERM GOAL #5   Title Pt will increase RUE strength to 4+/5 to improve ability to complete yardwork tasks.    Time 8   Period Weeks   Status On-going               Plan - 09/18/15 1203    Clinical Impression Statement A: Reassessment completed this session, pt is making excellent progress and has met all STGs and 2/5 LTGs. Pt reports he is using his RUE as dominant at home, experiencing minimal soreness after completing HEP or yardwork tasks. Increased scapular theraband to green resistance this session, resumed w arms. Pt will benefit from continued OT services to continue strengthening RUE and increasing activity tolerance, enabling pt to use RUE as dominant in all ADL and leisure tasks.    Rehab Potential Good   OT Frequency 1x / week   OT Duration 4 weeks   OT Treatment/Interventions Self-care/ADL training;Ultrasound;DME and/or AE instruction;Passive range of motion;Patient/family education;Cryotherapy;Electrical Stimulation;Moist Heat;Therapeutic exercise;Manual Therapy;Therapeutic activities   Plan P: Continue to follow MD protocol:  A/ROM of RUE. Focus on strengthening and activity tolerance of RUE, focusing on form and independence during exercises. Update HEP as necessary      Patient will benefit from skilled therapeutic intervention in order to improve the following deficits and impairments:  Decreased strength, Decreased knowledge of precautions, Pain, Impaired UE functional use, Decreased activity tolerance, Decreased range of motion, Increased fascial restricitons, Impaired flexibility  Visit Diagnosis: Other symptoms and signs involving the musculoskeletal system  Pain in right shoulder    Problem List Patient Active Problem List   Diagnosis Date Noted  . S/P total knee arthroplasty 09/17/2014  . Leg pain 10/14/2011    Guadelupe Sabin, OTR/L  [phone] 09/18/2015, 12:12 PM  [place] [address] [place], [place], [address] Phone: [phone]   Fax:  [phone]  Name: QUINTAVIOUS RINCK MRN: [id number] Date of Birth: 01-Oct-1950

## 2015-09-20 ENCOUNTER — Ambulatory Visit (HOSPITAL_COMMUNITY): Payer: PPO

## 2015-09-20 ENCOUNTER — Encounter (HOSPITAL_COMMUNITY): Payer: Self-pay

## 2015-09-20 DIAGNOSIS — R29898 Other symptoms and signs involving the musculoskeletal system: Secondary | ICD-10-CM | POA: Diagnosis not present

## 2015-09-20 NOTE — Therapy (Signed)
[facility] [facility] [address] [place], [place], [address] Phone: [phone]   Fax:  [phone]  Occupational Therapy Treatment  Patient Details  Name: Harold Nicholson MRN: [id number] Date of Birth: 1950/04/07 Referring Provider: Dr. Nash Dimmer  Encounter Date: 09/20/2015      OT End of Session - 09/20/15 1146    Visit Number 18   Number of Visits 25   Date for OT Re-Evaluation 10/16/15   Authorization Type [employer]   Authorization Time Period Before 29th visit   Authorization - Visit Number 18   Authorization - Number of Visits 29   OT Start Time 1120   OT Stop Time 1200   OT Time Calculation (min) 40 min   Activity Tolerance Patient tolerated treatment well   Behavior During Therapy [facility] for tasks assessed/performed      Past Medical History:  Diagnosis Date  . Arthritis   . Complication of anesthesia    pt stated he stopped breathing during surgery  . Hiatal hernia   . Hypercholesteremia   . Hypertension   . Hypothyroidism   . Impaired fasting glucose   . Pure hyperglyceridemia   . Thyroid disease    Hypothyroid     Past Surgical History:  Procedure Laterality Date  . CHOLECYSTECTOMY    . CHOLECYSTECTOMY N/A 05/01/2013   Procedure: LAPAROSCOPIC CHOLECYSTECTOMY;  Surgeon: Dalia Heading, MD;  [facility];  Service: General;  Laterality: N/A;  . COLONOSCOPY  10/07/2010   Procedure: COLONOSCOPY;  Surgeon: Dalia Heading;  [facility];  Service: Gastroenterology;  Laterality: N/A;  . ESOPHAGOGASTRODUODENOSCOPY  10/07/2010   Procedure: ESOPHAGOGASTRODUODENOSCOPY (EGD);  Surgeon: Dalia Heading;  [facility];  Service: Gastroenterology;  Laterality: N/A;  . EYE SURGERY    . JOINT REPLACEMENT     Right knee  . Left Knee Arthroscopy    . LIVER BIOPSY N/A 05/01/2013   Procedure: LIVER BIOPSY;  Surgeon: Dalia Heading, MD;  [facility]: [facility];  Service: General;  Laterality: N/A;  .  right knee     X 2  . TOTAL KNEE ARTHROPLASTY Left 09/17/2014   Procedure: TOTAL KNEE ARTHROPLASTY;  Surgeon: Dannielle Huh, MD;  [facility];  Service: Orthopedics;  Laterality: Left;    There were no vitals filed for this visit.      Subjective Assessment - 09/20/15 1125    Subjective  S: I went and saw my family doctor yesterday and I told him what my surgeon said about me tearing my shoulder. He said that there's no way to determin if I tore my rotator cuff muscle just by looking at me.             [facility] OT Assessment - 09/20/15 1127      Assessment   Diagnosis Right RCR     Precautions   Precautions Shoulder   Type of Shoulder Precautions Follow standard protocol. Begin AA/ROM and progress to A/ROM and strengthening.                  OT Treatments/Exercises (OP) - 09/20/15 1127      Exercises   Exercises Shoulder     Shoulder Exercises: Prone   Other Prone Exercises [place] exercises; 12X   Other Prone Exercises Abduction; 12X     Shoulder Exercises: Sidelying   External Rotation AROM;15 reps  2 sets   Internal Rotation AROM;15 reps  2 sets   Flexion AROM;15  reps  2 sets   ABduction AROM;15 reps  2 sets   Other Sidelying Exercises Serrartus anterior punch 15X; 2 sets   Other Sidelying Exercises Rhythmic stabilization with arm positioned at 90 degrees abduction; 30 seconds completed      Shoulder Exercises: Standing   Protraction AROM;15 reps   Horizontal ABduction AROM;15 reps   External Rotation AROM;15 reps   Internal Rotation AROM;15 reps   Flexion AROM;15 reps   ABduction AROM;15 reps   Extension Theraband;15 reps   Theraband Level (Shoulder Extension) Level 3 (Green)   Row [employer] reps   Theraband Level (Shoulder Row) Level 3 (Green)   Retraction Theraband;15 reps   Theraband Level (Shoulder Retraction) Level 3 (Green)     Shoulder Exercises: Therapy Ball   Right/Left 5 reps   Other Therapy Ball Exercises using red weighted  ball patient completed 5 right/left circles                  OT Short Term Goals - 08/28/15 1114      OT SHORT TERM GOAL #1   Title Pt will be educated on HEP.    Time 4   Period Weeks     OT SHORT TERM GOAL #2   Title Pt will decrease pain to 5/10 or less in RUE to increase ability to perform daily tasks.    Time 4   Period Weeks     OT SHORT TERM GOAL #3   Title Pt will decrease fascial restrictions from mod to min amounts or less in the RUE to increase mobility required for daily task completion.    Time 4   Period Weeks     OT SHORT TERM GOAL #4   Title Pt will increase P/ROM to [facility] in RUE to increase participation in dressing tasks.    Time 4   Period Weeks     OT SHORT TERM GOAL #5   Title Pt will increase RUE strength to 3/5 to increase ability to perform grooming tasks.    Time 4   Period Weeks           OT Long Term Goals - 09/20/15 1214      OT LONG TERM GOAL #1   Title Pt will return to prior level of functioning and independence in daily and leisure tasks using RUE as dominant.    Time 8   Period Weeks   Status On-going     OT LONG TERM GOAL #2   Title Pt will decrease pain in the RUE to 2/10 or less to increase ability to use RUE as dominant during daily tasks.    Time 8   Period Weeks     OT LONG TERM GOAL #3   Title Pt will decrease fascial restrictions in RUE from min to trace amounts or less to improve flexibility required for daily and leisure tasks.    Time 8   Period Weeks   Status On-going     OT LONG TERM GOAL #4   Title Pt will increase A/ROM to [facility] in RUE to improve ability to reach into overhead cabinets during daily tasks.    Time 8   Period Weeks     OT LONG TERM GOAL #5   Title Pt will increase RUE strength to 4+/5 to improve ability to complete yardwork tasks.    Time 8   Period Weeks   Status On-going  Plan - 25-Sep-2015 1211    Clinical Impression Statement A: G code updated this session.  Patient reports that he feels his ROM is great. he does feel some discomfort and tightness when reaching behind him as if he was reaching into the backseat of the car. Session focused on shoulder/scapular stability and endurance. VC needed for proper form and technique during some exercises. Added sidelying and ball circles this session.    Plan P: Add arms on fire to increase shoulder/scapular stability and endurance.       Patient will benefit from skilled therapeutic intervention in order to improve the following deficits and impairments:  Decreased strength, Decreased knowledge of precautions, Pain, Impaired UE functional use, Decreased activity tolerance, Decreased range of motion, Increased fascial restricitons, Impaired flexibility  Visit Diagnosis: Other symptoms and signs involving the musculoskeletal system      G-Codes - September 25, 2015 1213    Functional Assessment Tool Used FOTO score: 75/100 (24% impaired)   Functional Limitation Carrying, moving and handling objects   Carrying, Moving and Handling Objects Current Status [id number]) At least 20 percent but less than 40 percent impaired, limited or restricted   Carrying, Moving and Handling Objects Goal Status [id number]) At least 20 percent but less than 40 percent impaired, limited or restricted      Problem List Patient Active Problem List   Diagnosis Date Noted  . S/P total knee arthroplasty 09/17/2014  . Leg pain 10/14/2011   Ailene Ravel, OTR/L,CBIS  [phone]  09-25-15, 12:14 PM  [place] [address] [place], [place], [address] Phone: [phone]   Fax:  [phone]  Name: Harold Nicholson MRN: [id number] Date of Birth: 1950/11/18

## 2015-09-25 ENCOUNTER — Ambulatory Visit (HOSPITAL_COMMUNITY): Payer: PPO

## 2015-09-25 ENCOUNTER — Encounter (HOSPITAL_COMMUNITY): Payer: Self-pay

## 2015-09-25 DIAGNOSIS — M25511 Pain in right shoulder: Secondary | ICD-10-CM

## 2015-09-25 DIAGNOSIS — R29898 Other symptoms and signs involving the musculoskeletal system: Secondary | ICD-10-CM | POA: Diagnosis not present

## 2015-09-25 NOTE — Therapy (Signed)
[place], [place], [address] Phone: [phone]   Fax:  [phone]  Occupational Therapy Treatment  Patient Details  Name: Harold Nicholson MRN: [id number] Date of Birth: 1950/05/17 Referring Provider: Dr. Charlotte Sanes  Encounter Date: 09/25/2015      OT End of Session - 09/25/15 1125    Visit Number 19   Number of Visits 25   Date for OT Re-Evaluation 10/16/15   Authorization Type [employer]   Authorization Time Period Before 29th visit   Authorization - Visit Number 19   Authorization - Number of Visits 29   OT Start Time 1035   OT Stop Time 1115   OT Time Calculation (min) 40 min   Activity Tolerance Patient tolerated treatment well   Behavior During Therapy [facility] for tasks assessed/performed      Past Medical History:  Diagnosis Date  . Arthritis   . Complication of anesthesia    pt stated he stopped breathing during surgery  . Hiatal hernia   . Hypercholesteremia   . Hypertension   . Hypothyroidism   . Impaired fasting glucose   . Pure hyperglyceridemia   . Thyroid disease    Hypothyroid     Past Surgical History:  Procedure Laterality Date  . CHOLECYSTECTOMY    . CHOLECYSTECTOMY N/A 05/01/2013   Procedure: LAPAROSCOPIC CHOLECYSTECTOMY;  Surgeon: Jamesetta So, MD;  [facility];  Service: General;  Laterality: N/A;  . COLONOSCOPY  10/07/2010   Procedure: COLONOSCOPY;  Surgeon: Jamesetta So;  [facility];  Service: Gastroenterology;  Laterality: N/A;  . ESOPHAGOGASTRODUODENOSCOPY  10/07/2010   Procedure: ESOPHAGOGASTRODUODENOSCOPY (EGD);  Surgeon: Jamesetta So;  [facility];  Service: Gastroenterology;  Laterality: N/A;  . EYE SURGERY    . JOINT REPLACEMENT     Right knee  . Left Knee Arthroscopy    . LIVER BIOPSY N/A 05/01/2013   Procedure: LIVER BIOPSY;  Surgeon: Jamesetta So, MD;  Location: [facility];  Service: General;  Laterality: N/A;  .  right knee     X 2  . TOTAL KNEE ARTHROPLASTY Left 09/17/2014   Procedure: TOTAL KNEE ARTHROPLASTY;  Surgeon: Vickey Huger, MD;  Location: [place];  Service: Orthopedics;  Laterality: Left;    There were no vitals filed for this visit.      Subjective Assessment - 09/25/15 1057    Subjective  S: This past weekend my shoulder has been really sore. It may have been what we did Friday or maybe I slept weird; I don't know.    Currently in Pain? Yes   Pain Score 2    Pain Location Shoulder   Pain Orientation Right   Pain Descriptors / Indicators Sore   Pain Type Acute pain   Pain Radiating Towards N/A   Pain Onset In the past 7 days   Pain Frequency Intermittent   Aggravating Factors  Unknown   Pain Relieving Factors Rest   Effect of Pain on Daily Activities None   Multiple Pain Sites No                      OT Treatments/Exercises (OP) - 09/25/15 1059      Exercises   Exercises Shoulder     Shoulder Exercises: Supine   Protraction PROM;5 reps   Horizontal ABduction PROM;5 reps   External Rotation PROM;5 reps   External Rotation Limitations abducted   Internal Rotation PROM;5  reps   Internal Rotation Limitations abducted   Flexion PROM;5 reps   ABduction PROM;5 reps     Shoulder Exercises: Sidelying   External Rotation AROM;15 reps  2 sets   Internal Rotation AROM;15 reps  2 sets   Flexion AROM;15 reps  2 sets   ABduction AROM;15 reps  2 sets   Other Sidelying Exercises Serrartus anterior punch 15X; 2 sets   Other Sidelying Exercises Rhythmic stabilization with arm positioned at 90 degrees abduction; 30 seconds completed      Shoulder Exercises: Standing   Protraction AROM;15 reps   Horizontal ABduction AROM;15 reps   External Rotation AROM;15 reps   Internal Rotation AROM;15 reps   Flexion AROM;15 reps   ABduction AROM;15 reps     Shoulder Exercises: Therapy Ball   Right/Left 5 reps   Other Therapy Ball Exercises using red weighted ball patient  completed 5 right/left circles     Manual Therapy   Manual Therapy Myofascial release   Manual therapy comments Manual therapy completed prior to exercises.    Myofascial Release Myofascial release and manual stretching completed to  RUE upper arm, trapezius, and scapularis region to decrease fascial restrictions and increase joint mobility in a pain free zone.                   OT Short Term Goals - 08/28/15 1114      OT SHORT TERM GOAL #1   Title Pt will be educated on HEP.    Time 4   Period Weeks     OT SHORT TERM GOAL #2   Title Pt will decrease pain to 5/10 or less in RUE to increase ability to perform daily tasks.    Time 4   Period Weeks     OT SHORT TERM GOAL #3   Title Pt will decrease fascial restrictions from mod to min amounts or less in the RUE to increase mobility required for daily task completion.    Time 4   Period Weeks     OT SHORT TERM GOAL #4   Title Pt will increase P/ROM to [facility] in RUE to increase participation in dressing tasks.    Time 4   Period Weeks     OT SHORT TERM GOAL #5   Title Pt will increase RUE strength to 3/5 to increase ability to perform grooming tasks.    Time 4   Period Weeks           OT Long Term Goals - 09/20/15 1214      OT LONG TERM GOAL #1   Title Pt will return to prior level of functioning and independence in daily and leisure tasks using RUE as dominant.    Time 8   Period Weeks   Status On-going     OT LONG TERM GOAL #2   Title Pt will decrease pain in the RUE to 2/10 or less to increase ability to use RUE as dominant during daily tasks.    Time 8   Period Weeks     OT LONG TERM GOAL #3   Title Pt will decrease fascial restrictions in RUE from min to trace amounts or less to improve flexibility required for daily and leisure tasks.    Time 8   Period Weeks   Status On-going     OT LONG TERM GOAL #4   Title Pt will increase A/ROM to [facility] in RUE to improve ability to reach into overhead cabinets  during  daily tasks.    Time 8   Period Weeks     OT LONG TERM GOAL #5   Title Pt will increase RUE strength to 4+/5 to improve ability to complete yardwork tasks.    Time 8   Period Weeks   Status On-going               Plan - 09/25/15 1125    Clinical Impression Statement A: Pt reports increased soreness over the weakness and was unsure what caused it (therapy or sleeping position). Did not add new exercises this session due to soreness. Pt did present with muscle knots in medial deltoid close to the [facility] joint. Pt reports decreased soreness at end of session.    Plan P: IF able to tolerate add arms on fire to increase shoulder and scapular stability and endurance. Do low time of 15 seconds per position for 1-2 minutes.       Patient will benefit from skilled therapeutic intervention in order to improve the following deficits and impairments:  Decreased strength, Decreased knowledge of precautions, Pain, Impaired UE functional use, Decreased activity tolerance, Decreased range of motion, Increased fascial restricitons, Impaired flexibility  Visit Diagnosis: Other symptoms and signs involving the musculoskeletal system  Pain in right shoulder    Problem List Patient Active Problem List   Diagnosis Date Noted  . S/P total knee arthroplasty 09/17/2014  . Leg pain 10/14/2011   Ailene Ravel, OTR/L,CBIS  [phone]  09/25/2015, 11:30 AM  [place], [place], [address] Phone: [phone]   Fax:  [phone]  Name: Harold Nicholson MRN: [id number] Date of Birth: [birth year]

## 2015-09-27 ENCOUNTER — Ambulatory Visit (HOSPITAL_COMMUNITY): Payer: PPO | Admitting: Occupational Therapy

## 2015-09-27 ENCOUNTER — Encounter (HOSPITAL_COMMUNITY): Payer: Self-pay | Admitting: Occupational Therapy

## 2015-09-27 DIAGNOSIS — R29898 Other symptoms and signs involving the musculoskeletal system: Secondary | ICD-10-CM | POA: Diagnosis not present

## 2015-09-27 DIAGNOSIS — M25511 Pain in right shoulder: Secondary | ICD-10-CM

## 2015-09-27 NOTE — Therapy (Signed)
[name], [place], [address] Phone: [phone]   Fax:  [phone]  Occupational Therapy Treatment  Patient Details  Name: Harold Nicholson MRN: [id number] Date of Birth: Oct 11, 1950 Referring Provider: Dr. Charlotte Sanes  Encounter Date: 09/27/2015      OT End of Session - 09/27/15 1205    Visit Number 20   Number of Visits 25   Date for OT Re-Evaluation 10/16/15   Authorization Type [employer]   Authorization Time Period Before 29th visit   Authorization - Visit Number 30   Authorization - Number of Visits 29   OT Start Time 1029   OT Stop Time 1111   OT Time Calculation (min) 42 min   Activity Tolerance Patient tolerated treatment well   Behavior During Therapy [facility] for tasks assessed/performed      Past Medical History:  Diagnosis Date  . Arthritis   . Complication of anesthesia    pt stated he stopped breathing during surgery  . Hiatal hernia   . Hypercholesteremia   . Hypertension   . Hypothyroidism   . Impaired fasting glucose   . Pure hyperglyceridemia   . Thyroid disease    Hypothyroid     Past Surgical History:  Procedure Laterality Date  . CHOLECYSTECTOMY    . CHOLECYSTECTOMY N/A 05/01/2013   Procedure: LAPAROSCOPIC CHOLECYSTECTOMY;  Surgeon: Jamesetta So, MD;  [facility];  Service: General;  Laterality: N/A;  . COLONOSCOPY  10/07/2010   Procedure: COLONOSCOPY;  Surgeon: Jamesetta So;  [facility];  Service: Gastroenterology;  Laterality: N/A;  . ESOPHAGOGASTRODUODENOSCOPY  10/07/2010   Procedure: ESOPHAGOGASTRODUODENOSCOPY (EGD);  Surgeon: Jamesetta So;  [facility];  Service: Gastroenterology;  Laterality: N/A;  . EYE SURGERY    . JOINT REPLACEMENT     Right knee  . Left Knee Arthroscopy    . LIVER BIOPSY N/A 05/01/2013   Procedure: LIVER BIOPSY;  Surgeon: Jamesetta So, MD;  Location: [facility];  Service: General;  Laterality: N/A;  .  right knee     X 2  . TOTAL KNEE ARTHROPLASTY Left 09/17/2014   Procedure: TOTAL KNEE ARTHROPLASTY;  Surgeon: Vickey Huger, MD;  Location: [place];  Service: Orthopedics;  Laterality: Left;    There were no vitals filed for this visit.      Subjective Assessment - 09/27/15 1029    Subjective  S: It's still a little sore, but it's not as sore as it was.    Currently in Pain? Yes   Pain Score 1    Pain Location Shoulder   Pain Orientation Right   Pain Descriptors / Indicators Sore   Pain Type Acute pain   Pain Radiating Towards n/a   Pain Onset In the past 7 days   Pain Frequency Intermittent   Aggravating Factors  unknown   Pain Relieving Factors rest   Effect of Pain on Daily Activities none            [facility] OT Assessment - 09/27/15 1011      Assessment   Diagnosis Right RCR     Precautions   Precautions Shoulder   Type of Shoulder Precautions Follow standard protocol. Begin AA/ROM and progress to A/ROM and strengthening.                  OT Treatments/Exercises (OP) - 09/27/15 1032      Exercises   Exercises Shoulder  Shoulder Exercises: Supine   Protraction PROM;5 reps   Horizontal ABduction PROM;5 reps   External Rotation PROM;5 reps   Internal Rotation PROM;5 reps   Flexion PROM;5 reps   ABduction PROM;5 reps     Shoulder Exercises: Prone   Other Prone Exercises [place] exercises; 15X     Shoulder Exercises: Sidelying   External Rotation AROM;15 reps  2 sets   Internal Rotation AROM;15 reps  2 sets   Flexion AROM;15 reps  2 sets   ABduction AROM;15 reps  2 sets   Other Sidelying Exercises protraction, serratus anterior punch 15X, A/ROM, 2 sets     Shoulder Exercises: ROM/Strengthening   X to V Arms 15X   Ball on Wall 1'30" abduction   Other ROM/Strengthening Exercises Standing field goal, 10X, A/ROM   Other ROM/Strengthening Exercises Arms on fire, 15 second sets, 45 seconds total     Manual Therapy   Manual Therapy Myofascial  release   Manual therapy comments Manual therapy completed prior to exercises.    Myofascial Release Myofascial release and manual stretching completed to  RUE upper arm, trapezius, and scapularis region to decrease fascial restrictions and increase joint mobility in a pain free zone.                   OT Short Term Goals - 08/28/15 1114      OT SHORT TERM GOAL #1   Title Pt will be educated on HEP.    Time 4   Period Weeks     OT SHORT TERM GOAL #2   Title Pt will decrease pain to 5/10 or less in RUE to increase ability to perform daily tasks.    Time 4   Period Weeks     OT SHORT TERM GOAL #3   Title Pt will decrease fascial restrictions from mod to min amounts or less in the RUE to increase mobility required for daily task completion.    Time 4   Period Weeks     OT SHORT TERM GOAL #4   Title Pt will increase P/ROM to [facility] in RUE to increase participation in dressing tasks.    Time 4   Period Weeks     OT SHORT TERM GOAL #5   Title Pt will increase RUE strength to 3/5 to increase ability to perform grooming tasks.    Time 4   Period Weeks           OT Long Term Goals - 09/20/15 1214      OT LONG TERM GOAL #1   Title Pt will return to prior level of functioning and independence in daily and leisure tasks using RUE as dominant.    Time 8   Period Weeks   Status On-going     OT LONG TERM GOAL #2   Title Pt will decrease pain in the RUE to 2/10 or less to increase ability to use RUE as dominant during daily tasks.    Time 8   Period Weeks     OT LONG TERM GOAL #3   Title Pt will decrease fascial restrictions in RUE from min to trace amounts or less to improve flexibility required for daily and leisure tasks.    Time 8   Period Weeks   Status On-going     OT LONG TERM GOAL #4   Title Pt will increase A/ROM to [facility] in RUE to improve ability to reach into overhead cabinets during daily tasks.    Time  8   Period Weeks     OT LONG TERM GOAL #5   Title  Pt will increase RUE strength to 4+/5 to improve ability to complete yardwork tasks.    Time 8   Period Weeks   Status On-going               Plan - 09/27/15 1205    Clinical Impression Statement A: Pt reports soreness from weekend and previous session has resolved, minimal soreness from working in the yard yesterday. Added arms on fire, pt able to tolerate for 45 seconds total before unable to continue due to fatigue. Resumed prone exercises, continued sidelying, added standing field goal for scapular stabilization and strengthening.    Rehab Potential Good   OT Frequency 1x / week   OT Duration 4 weeks   OT Treatment/Interventions Self-care/ADL training;Ultrasound;DME and/or AE instruction;Passive range of motion;Patient/family education;Cryotherapy;Electrical Stimulation;Moist Heat;Therapeutic exercise;Manual Therapy;Therapeutic activities   Plan P: Continue with arms on fire working towards 1 min completion time continuing to work towards scapular stability and greater endurance      Patient will benefit from skilled therapeutic intervention in order to improve the following deficits and impairments:  Decreased strength, Decreased knowledge of precautions, Pain, Impaired UE functional use, Decreased activity tolerance, Decreased range of motion, Increased fascial restricitons, Impaired flexibility  Visit Diagnosis: Other symptoms and signs involving the musculoskeletal system  Pain in right shoulder    Problem List Patient Active Problem List   Diagnosis Date Noted  . S/P total knee arthroplasty 09/17/2014  . Leg pain 10/14/2011    Guadelupe Sabin, OTR/L  [phone] 09/27/2015, 12:10 PM  [place], [place], [address] Phone: [phone]   Fax:  [phone]  Name: QUOC KASEMAN MRN: [id number] Date of Birth: Dec 08, 1950

## 2015-10-02 ENCOUNTER — Encounter (HOSPITAL_COMMUNITY): Payer: Self-pay

## 2015-10-02 ENCOUNTER — Ambulatory Visit (HOSPITAL_COMMUNITY): Payer: PPO

## 2015-10-02 DIAGNOSIS — R29898 Other symptoms and signs involving the musculoskeletal system: Secondary | ICD-10-CM | POA: Diagnosis not present

## 2015-10-02 NOTE — Therapy (Signed)
[place], [place], [address] Phone: [phone]   Fax:  [phone]  Occupational Therapy Treatment  Patient Details  Name: Harold Nicholson MRN: [id number] Date of Birth: 02-08-1951 Referring Provider: Dr. Charlotte Sanes  Encounter Date: 10/02/2015      OT End of Session - 10/02/15 1131    Visit Number 21   Number of Visits 25   Date for OT Re-Evaluation 10/16/15   Authorization Type [employer]   Authorization Time Period Before 29th visit   Authorization - Visit Number 21   Authorization - Number of Visits 29   OT Start Time 1035   OT Stop Time 1115   OT Time Calculation (min) 40 min   Activity Tolerance Patient tolerated treatment well   Behavior During Therapy [facility] for tasks assessed/performed      Past Medical History:  Diagnosis Date  . Arthritis   . Complication of anesthesia    pt stated he stopped breathing during surgery  . Hiatal hernia   . Hypercholesteremia   . Hypertension   . Hypothyroidism   . Impaired fasting glucose   . Pure hyperglyceridemia   . Thyroid disease    Hypothyroid     Past Surgical History:  Procedure Laterality Date  . CHOLECYSTECTOMY    . CHOLECYSTECTOMY N/A 05/01/2013   Procedure: LAPAROSCOPIC CHOLECYSTECTOMY;  Surgeon: Jamesetta So, MD;  Location: [facility];  Service: General;  Laterality: N/A;  . COLONOSCOPY  10/07/2010   Procedure: COLONOSCOPY;  Surgeon: Jamesetta So;  [facility];  Service: Gastroenterology;  Laterality: N/A;  . ESOPHAGOGASTRODUODENOSCOPY  10/07/2010   Procedure: ESOPHAGOGASTRODUODENOSCOPY (EGD);  Surgeon: Jamesetta So;  [facility];  Service: Gastroenterology;  Laterality: N/A;  . EYE SURGERY    . JOINT REPLACEMENT     Right knee  . Left Knee Arthroscopy    . LIVER BIOPSY N/A 05/01/2013   Procedure: LIVER BIOPSY;  Surgeon: Jamesetta So, MD;  Location: [facility];  Service: General;  Laterality: N/A;  .  right knee     X 2  . TOTAL KNEE ARTHROPLASTY Left 09/17/2014   Procedure: TOTAL KNEE ARTHROPLASTY;  Surgeon: Vickey Huger, MD;  Location: [place];  Service: Orthopedics;  Laterality: Left;    There were no vitals filed for this visit.      Subjective Assessment - 10/02/15 1057    Subjective  S: My right shoulder feels fine. My left shoulder is actually a little sore but I think I slept on it.   Currently in Pain? No/denies            [facility] OT Assessment - 10/02/15 1058      Assessment   Diagnosis Right RCR     Precautions   Precautions Shoulder   Type of Shoulder Precautions Per Dr. only complete A/ROM until follow up appointment.                   OT Treatments/Exercises (OP) - 10/02/15 1059      Exercises   Exercises Shoulder     Shoulder Exercises: Supine   Protraction PROM;5 reps   Horizontal ABduction PROM;5 reps   External Rotation PROM;5 reps   Internal Rotation PROM;5 reps   Flexion PROM;5 reps   ABduction PROM;5 reps     Shoulder Exercises: Prone   Other Prone Exercises [place] exercises; 15X     Shoulder Exercises: Sidelying   External Rotation AROM;15 reps  2 sets   Internal Rotation AROM;15 reps  2 sets   Flexion AROM;15 reps  2 sets   ABduction AROM;15 reps  2 sets   Other Sidelying Exercises protraction, serratus anterior punch 15X, A/ROM, 2 sets     Shoulder Exercises: Standing   Extension Theraband;15 reps   Theraband Level (Shoulder Extension) Level 3 (Green)   Row [employer] reps   Theraband Level (Shoulder Row) Level 3 (Green)   Retraction Theraband;15 reps   Theraband Level (Shoulder Retraction) Level 3 (Green)     Shoulder Exercises: ROM/Strengthening   UBE (Upper Arm Bike) Level 3 2' forward 2' reverse   X to V Arms 15X   Other ROM/Strengthening Exercises Arms on fire, 15 second sets, 45 seconds total     Manual Therapy   Manual Therapy Myofascial release   Manual therapy comments Manual therapy completed prior to  exercises.    Myofascial Release Myofascial release and manual stretching completed to  RUE upper arm, trapezius, and scapularis region to decrease fascial restrictions and increase joint mobility in a pain free zone.                   OT Short Term Goals - 08/28/15 1114      OT SHORT TERM GOAL #1   Title Pt will be educated on HEP.    Time 4   Period Weeks     OT SHORT TERM GOAL #2   Title Pt will decrease pain to 5/10 or less in RUE to increase ability to perform daily tasks.    Time 4   Period Weeks     OT SHORT TERM GOAL #3   Title Pt will decrease fascial restrictions from mod to min amounts or less in the RUE to increase mobility required for daily task completion.    Time 4   Period Weeks     OT SHORT TERM GOAL #4   Title Pt will increase P/ROM to [facility] in RUE to increase participation in dressing tasks.    Time 4   Period Weeks     OT SHORT TERM GOAL #5   Title Pt will increase RUE strength to 3/5 to increase ability to perform grooming tasks.    Time 4   Period Weeks           OT Long Term Goals - 09/20/15 1214      OT LONG TERM GOAL #1   Title Pt will return to prior level of functioning and independence in daily and leisure tasks using RUE as dominant.    Time 8   Period Weeks   Status On-going     OT LONG TERM GOAL #2   Title Pt will decrease pain in the RUE to 2/10 or less to increase ability to use RUE as dominant during daily tasks.    Time 8   Period Weeks     OT LONG TERM GOAL #3   Title Pt will decrease fascial restrictions in RUE from min to trace amounts or less to improve flexibility required for daily and leisure tasks.    Time 8   Period Weeks   Status On-going     OT LONG TERM GOAL #4   Title Pt will increase A/ROM to [facility] in RUE to improve ability to reach into overhead cabinets during daily tasks.    Time 8   Period Weeks     OT LONG TERM GOAL #5   Title Pt will increase RUE  strength to 4+/5 to improve ability to complete  yardwork tasks.    Time 8   Period Weeks   Status On-going               Plan - 10/02/15 1114    Clinical Impression Statement A: Pt unable to make it to 1' during arms on fire as he dropped his arms. VC as needed during session for form and technique.    Plan P: Continue with arm on fire. Have patient rest his hands on his head if he needs a break during activity versus dropping his arms. Try to make it to 1 minute. Increase to blue band for scapular strengthening.       Patient will benefit from skilled therapeutic intervention in order to improve the following deficits and impairments:  Decreased strength, Decreased knowledge of precautions, Pain, Impaired UE functional use, Decreased activity tolerance, Decreased range of motion, Increased fascial restricitons, Impaired flexibility  Visit Diagnosis: Other symptoms and signs involving the musculoskeletal system    Problem List Patient Active Problem List   Diagnosis Date Noted  . S/P total knee arthroplasty 09/17/2014  . Leg pain 10/14/2011   Ailene Ravel, OTR/L,CBIS  [phone]  10/02/2015, 11:34 AM  [place] [address] [place], [place], [address] Phone: [phone]   Fax:  [phone]  Name: WESS SULEIMAN MRN: [id number] Date of Birth: 1950-11-14

## 2015-10-04 ENCOUNTER — Encounter (HOSPITAL_COMMUNITY): Payer: Self-pay | Admitting: Occupational Therapy

## 2015-10-04 ENCOUNTER — Ambulatory Visit (HOSPITAL_COMMUNITY): Payer: PPO | Admitting: Occupational Therapy

## 2015-10-04 DIAGNOSIS — R29898 Other symptoms and signs involving the musculoskeletal system: Secondary | ICD-10-CM | POA: Diagnosis not present

## 2015-10-04 DIAGNOSIS — M25511 Pain in right shoulder: Secondary | ICD-10-CM

## 2015-10-04 NOTE — Therapy (Signed)
[place] [address] [place], [place], [address] Phone: [phone]   Fax:  [phone]  Occupational Therapy Treatment  Patient Details  Name: Harold Nicholson MRN: [id number] Date of Birth: Dec 17, 1950 Referring Provider: Dr. Charlotte Sanes  Encounter Date: 10/04/2015      OT End of Session - 10/04/15 1141    Visit Number 22   Number of Visits 25   Date for OT Re-Evaluation 10/16/15   Authorization Type [employer]   Authorization Time Period Before 29th visit   Authorization - Visit Number 76   Authorization - Number of Visits 29   OT Start Time 0944   OT Stop Time 1028   OT Time Calculation (min) 44 min   Activity Tolerance Patient tolerated treatment well   Behavior During Therapy [facility] for tasks assessed/performed      Past Medical History:  Diagnosis Date  . Arthritis   . Complication of anesthesia    pt stated he stopped breathing during surgery  . Hiatal hernia   . Hypercholesteremia   . Hypertension   . Hypothyroidism   . Impaired fasting glucose   . Pure hyperglyceridemia   . Thyroid disease    Hypothyroid     Past Surgical History:  Procedure Laterality Date  . CHOLECYSTECTOMY    . CHOLECYSTECTOMY N/A 05/01/2013   Procedure: LAPAROSCOPIC CHOLECYSTECTOMY;  Surgeon: Jamesetta So, MD;  [facility];  Service: General;  Laterality: N/A;  . COLONOSCOPY  10/07/2010   Procedure: COLONOSCOPY;  Surgeon: Jamesetta So;  [facility];  Service: Gastroenterology;  Laterality: N/A;  . ESOPHAGOGASTRODUODENOSCOPY  10/07/2010   Procedure: ESOPHAGOGASTRODUODENOSCOPY (EGD);  Surgeon: Jamesetta So;  [facility];  Service: Gastroenterology;  Laterality: N/A;  . EYE SURGERY    . JOINT REPLACEMENT     Right knee  . Left Knee Arthroscopy    . LIVER BIOPSY N/A 05/01/2013   Procedure: LIVER BIOPSY;  Surgeon: Jamesetta So, MD;  [facility];  Service: General;  Laterality: N/A;  .  right knee     X 2  . TOTAL KNEE ARTHROPLASTY Left 09/17/2014   Procedure: TOTAL KNEE ARTHROPLASTY;  Surgeon: Vickey Huger, MD;  Location: [place];  Service: Orthopedics;  Laterality: Left;    There were no vitals filed for this visit.      Subjective Assessment - 10/04/15 0942    Subjective  S: I've been working on that arms on fire thing.    Currently in Pain? No/denies            [facility] OT Assessment - 10/04/15 0942      Assessment   Diagnosis Right RCR     Precautions   Precautions Shoulder   Type of Shoulder Precautions Per Dr. only complete A/ROM until follow up appointment.                   OT Treatments/Exercises (OP) - 10/04/15 0945      Exercises   Exercises Shoulder     Shoulder Exercises: Supine   Protraction PROM;5 reps   Horizontal ABduction PROM;5 reps   External Rotation PROM;5 reps   Internal Rotation PROM;5 reps   Flexion PROM;5 reps   ABduction PROM;5 reps     Shoulder Exercises: Prone   Other Prone Exercises [place] exercises; 15X     Shoulder Exercises: Sidelying   External Rotation AROM;15 reps  2 sets   Internal Rotation AROM;15 reps  2 sets   Flexion AROM;15 reps  2 sets   ABduction AROM;15 reps  2 sets   Other Sidelying Exercises protraction, serratus anterior punch 15X, A/ROM, 2 sets     Shoulder Exercises: Standing   Extension Theraband;15 reps   Theraband Level (Shoulder Extension) Level 4 (Blue)   Row Theraband;15 reps   Theraband Level (Shoulder Row) Level 4 (Blue)   Retraction Theraband;15 reps   Theraband Level (Shoulder Retraction) Level 4 (Blue)     Shoulder Exercises: ROM/Strengthening   X to V Arms 15X   Ball on Wall 1'30" abduction, flexion   Other ROM/Strengthening Exercises Standing field goal, 12X, A/ROM  mod difficulty depressing shoulder   Other ROM/Strengthening Exercises Arms on fire, 15 second sets, 1 minute total  two 1" arm breaks     Manual Therapy   Manual Therapy Myofascial release    Manual therapy comments Manual therapy completed prior to exercises.    Myofascial Release Myofascial release and manual stretching completed to  RUE upper arm, trapezius, and scapularis region to decrease fascial restrictions and increase joint mobility in a pain free zone.                   OT Short Term Goals - 08/28/15 1114      OT SHORT TERM GOAL #1   Title Pt will be educated on HEP.    Time 4   Period Weeks     OT SHORT TERM GOAL #2   Title Pt will decrease pain to 5/10 or less in RUE to increase ability to perform daily tasks.    Time 4   Period Weeks     OT SHORT TERM GOAL #3   Title Pt will decrease fascial restrictions from mod to min amounts or less in the RUE to increase mobility required for daily task completion.    Time 4   Period Weeks     OT SHORT TERM GOAL #4   Title Pt will [facility] to [facility] to increase participation in dressing tasks.    Time 4   Period Weeks     OT SHORT TERM GOAL #5   Title Pt will increase RUE strength to 3/5 to increase ability to perform grooming tasks.    Time 4   Period Weeks           OT Long Term Goals - 09/20/15 1214      OT LONG TERM GOAL #1   Title Pt will return to prior level of functioning and independence in daily and leisure tasks using RUE as dominant.    Time 8   Period Weeks   Status On-going     OT LONG TERM GOAL #2   Title Pt will decrease pain in the RUE to 2/10 or less to increase ability to use RUE as dominant during daily tasks.    Time 8   Period Weeks     OT LONG TERM GOAL #3   Title Pt will decrease fascial restrictions in RUE from min to trace amounts or less to improve flexibility required for daily and leisure tasks.    Time 8   Period Weeks   Status On-going     OT LONG TERM GOAL #4   Title Pt will [facility] to [facility] to improve ability to reach into overhead cabinets during daily tasks.    Time 8   Period Weeks     OT LONG TERM GOAL #5  Title Pt will  increase RUE strength to 4+/5 to improve ability to complete yardwork tasks.    Time 8   Period Weeks   Status On-going               Plan - 10/04/15 1141    Clinical Impression Statement A: Pt able to complete arms on fire for 1', with 2 quick 1 second rest breaks with arms on top of his head. Resumed ball on wall, completing for 1'30" in flexion and abduction, mod difficulty. Pt requires intermittent verbal cuing for form during exercises, rest breaks provided as needed. Provided blue scapular theraband for use with HEP.    Rehab Potential Good   OT Frequency 1x / week   OT Duration 4 weeks   OT Treatment/Interventions Self-care/ADL training;Ultrasound;DME and/or AE instruction;Passive range of motion;Patient/family education;Cryotherapy;Electrical Stimulation;Moist Heat;Therapeutic exercise;Manual Therapy;Therapeutic activities   Plan P: Continue working towards 1' with arms on fire with no rest breaks. Follow up on use of blue scapular theraband at home.       Patient will benefit from skilled therapeutic intervention in order to improve the following deficits and impairments:  Decreased strength, Decreased knowledge of precautions, Pain, Impaired UE functional use, Decreased activity tolerance, Decreased range of motion, Increased fascial restricitons, Impaired flexibility  Visit Diagnosis: Other symptoms and signs involving the musculoskeletal system  Pain in right shoulder    Problem List Patient Active Problem List   Diagnosis Date Noted  . S/P total knee arthroplasty 09/17/2014  . Leg pain 10/14/2011     Guadelupe Sabin, OTR/L  [phone] 10/04/2015, 11:45 AM  [facility] [address] [place], [place], [address] Phone: [phone]   Fax:  [phone]  Name: Harold Nicholson MRN: [id number] Date of Birth: 08-Jul-1950

## 2015-10-09 ENCOUNTER — Ambulatory Visit (HOSPITAL_COMMUNITY): Payer: PPO

## 2015-10-09 ENCOUNTER — Encounter (HOSPITAL_COMMUNITY): Payer: Self-pay

## 2015-10-09 DIAGNOSIS — R29898 Other symptoms and signs involving the musculoskeletal system: Secondary | ICD-10-CM

## 2015-10-09 NOTE — Therapy (Signed)
[place], [place], [address] Phone: [phone]   Fax:  [phone]  Occupational Therapy Treatment  Patient Details  Name: Harold Nicholson MRN: [id number] Date of Birth: Aug 01, 1950 Referring Provider: Dr. Charlotte Sanes  Encounter Date: 10/09/2015      OT End of Session - 10/09/15 1147    Visit Number 23   Number of Visits 25   Date for OT Re-Evaluation 10/16/15   Authorization Type [employer]   Authorization Time Period Before 29th visit   Authorization - Visit Number 23   Authorization - Number of Visits 29   OT Start Time 1030   OT Stop Time 1115   OT Time Calculation (min) 45 min   Activity Tolerance Patient tolerated treatment well   Behavior During Therapy [facility] for tasks assessed/performed      Past Medical History:  Diagnosis Date  . Arthritis   . Complication of anesthesia    pt stated he stopped breathing during surgery  . Hiatal hernia   . Hypercholesteremia   . Hypertension   . Hypothyroidism   . Impaired fasting glucose   . Pure hyperglyceridemia   . Thyroid disease    Hypothyroid     Past Surgical History:  Procedure Laterality Date  . CHOLECYSTECTOMY    . CHOLECYSTECTOMY N/A 05/01/2013   Procedure: LAPAROSCOPIC CHOLECYSTECTOMY;  Surgeon: Jamesetta So, MD;  [facility];  Service: General;  Laterality: N/A;  . COLONOSCOPY  10/07/2010   Procedure: COLONOSCOPY;  Surgeon: Jamesetta So;  [facility];  Service: Gastroenterology;  Laterality: N/A;  . ESOPHAGOGASTRODUODENOSCOPY  10/07/2010   Procedure: ESOPHAGOGASTRODUODENOSCOPY (EGD);  Surgeon: Jamesetta So;  [facility];  Service: Gastroenterology;  Laterality: N/A;  . EYE SURGERY    . JOINT REPLACEMENT     Right knee  . Left Knee Arthroscopy    . LIVER BIOPSY N/A 05/01/2013   Procedure: LIVER BIOPSY;  Surgeon: Jamesetta So, MD;  Location: [facility];  Service: General;  Laterality: N/A;  .  right knee     X 2  . TOTAL KNEE ARTHROPLASTY Left 09/17/2014   Procedure: TOTAL KNEE ARTHROPLASTY;  Surgeon: Vickey Huger, MD;  Location: [place];  Service: Orthopedics;  Laterality: Left;    There were no vitals filed for this visit.          [facility] OT Assessment - 10/09/15 1050      Assessment   Diagnosis Right RCR     Precautions   Precautions Shoulder   Type of Shoulder Precautions Per Dr. only complete A/ROM until follow up appointment.                   OT Treatments/Exercises (OP) - 10/09/15 1050      Exercises   Exercises Shoulder     Shoulder Exercises: Supine   Protraction PROM;5 reps   Horizontal ABduction PROM;5 reps   External Rotation PROM;5 reps   Internal Rotation PROM;5 reps   Flexion PROM;5 reps   ABduction PROM;5 reps     Shoulder Exercises: Prone   Other Prone Exercises Hughston exercises; 20X   Other Prone Exercises Abduction; 20X     Shoulder Exercises: Sidelying   External Rotation AROM;20 reps   Internal Rotation AROM;20 reps   Flexion AROM;20 reps   ABduction AROM;20 reps   Other Sidelying Exercises protraction, serratus anterior punch 20X, A/ROM, 2 sets     Shoulder Exercises: ROM/Strengthening  UBE (Upper Arm Bike) Level 3 2' forward 2' reverse   X to V Arms 15X   Ball on Wall 1'30 abduction, 1' flexion   Other ROM/Strengthening Exercises Standing field goal, 12X, A/ROM   Other ROM/Strengthening Exercises Arms on fire, 15 second sets, 1 minute total     Manual Therapy   Manual Therapy Myofascial release   Manual therapy comments Manual therapy completed prior to exercises.    Myofascial Release Myofascial release and manual stretching completed to  RUE upper arm, trapezius, and scapularis region to decrease fascial restrictions and increase joint mobility in a pain free zone.                   OT Short Term Goals - 08/28/15 1114      OT SHORT TERM GOAL #1   Title Pt will be educated on HEP.    Time 4    Period Weeks     OT SHORT TERM GOAL #2   Title Pt will decrease pain to 5/10 or less in RUE to increase ability to perform daily tasks.    Time 4   Period Weeks     OT SHORT TERM GOAL #3   Title Pt will decrease fascial restrictions from mod to min amounts or less in the RUE to increase mobility required for daily task completion.    Time 4   Period Weeks     OT SHORT TERM GOAL #4   Title Pt will increase P/ROM to [facility] in RUE to increase participation in dressing tasks.    Time 4   Period Weeks     OT SHORT TERM GOAL #5   Title Pt will increase RUE strength to 3/5 to increase ability to perform grooming tasks.    Time 4   Period Weeks           OT Long Term Goals - 09/20/15 1214      OT LONG TERM GOAL #1   Title Pt will return to prior level of functioning and independence in daily and leisure tasks using RUE as dominant.    Time 8   Period Weeks   Status On-going     OT LONG TERM GOAL #2   Title Pt will decrease pain in the RUE to 2/10 or less to increase ability to use RUE as dominant during daily tasks.    Time 8   Period Weeks     OT LONG TERM GOAL #3   Title Pt will decrease fascial restrictions in RUE from min to trace amounts or less to improve flexibility required for daily and leisure tasks.    Time 8   Period Weeks   Status On-going     OT LONG TERM GOAL #4   Title Pt will increase A/ROM to [facility] in RUE to improve ability to reach into overhead cabinets during daily tasks.    Time 8   Period Weeks     OT LONG TERM GOAL #5   Title Pt will increase RUE strength to 4+/5 to improve ability to complete yardwork tasks.    Time 8   Period Weeks   Status On-going               Plan - 10/09/15 1149    Clinical Impression Statement A: Patient was able to complete Arms on Fire exercise with 1 brief rest break at 50 seconds. VC needed during standing field goal exercise for form and technique.  Plan P: Continue to work increasing shoulder and scapular  stability.       Patient will benefit from skilled therapeutic intervention in order to improve the following deficits and impairments:  Decreased strength, Decreased knowledge of precautions, Pain, Impaired UE functional use, Decreased activity tolerance, Decreased range of motion, Increased fascial restricitons, Impaired flexibility  Visit Diagnosis: Other symptoms and signs involving the musculoskeletal system    Problem List Patient Active Problem List   Diagnosis Date Noted  . S/P total knee arthroplasty 09/17/2014  . Leg pain 10/14/2011   Ailene Ravel, OTR/L,CBIS  [phone]  10/09/2015, 12:05 PM  [place] [address] [place], [place], [address] Phone: [phone]   Fax:  [phone]  Name: Harold Nicholson MRN: [id number] Date of Birth: [birth year]

## 2015-10-11 ENCOUNTER — Encounter (HOSPITAL_COMMUNITY): Payer: Self-pay

## 2015-10-11 ENCOUNTER — Ambulatory Visit (HOSPITAL_COMMUNITY): Payer: PPO

## 2015-10-11 DIAGNOSIS — R29898 Other symptoms and signs involving the musculoskeletal system: Secondary | ICD-10-CM

## 2015-10-11 NOTE — Therapy (Signed)
[place], [place], [address] Phone: [phone]   Fax:  [phone]  Occupational Therapy Treatment  Patient Details  Name: Harold Nicholson MRN: [id number] Date of Birth: 1950-08-11 Referring Provider: Dr. Charlotte Sanes  Encounter Date: 10/11/2015      OT End of Session - 10/11/15 1224    Visit Number 24   Number of Visits 25   Date for OT Re-Evaluation 10/16/15   Authorization Type [employer]   Authorization Time Period Before 29th visit   Authorization - Visit Number 24   Authorization - Number of Visits 29   OT Start Time 1030   OT Stop Time 1115   OT Time Calculation (min) 45 min   Activity Tolerance Patient tolerated treatment well   Behavior During Therapy [facility] for tasks assessed/performed      Past Medical History:  Diagnosis Date  . Arthritis   . Complication of anesthesia    pt stated he stopped breathing during surgery  . Hiatal hernia   . Hypercholesteremia   . Hypertension   . Hypothyroidism   . Impaired fasting glucose   . Pure hyperglyceridemia   . Thyroid disease    Hypothyroid     Past Surgical History:  Procedure Laterality Date  . CHOLECYSTECTOMY    . CHOLECYSTECTOMY N/A 05/01/2013   Procedure: LAPAROSCOPIC CHOLECYSTECTOMY;  Surgeon: Jamesetta So, MD;  [facility];  Service: General;  Laterality: N/A;  . COLONOSCOPY  10/07/2010   Procedure: COLONOSCOPY;  Surgeon: Jamesetta So;  [facility];  Service: Gastroenterology;  Laterality: N/A;  . ESOPHAGOGASTRODUODENOSCOPY  10/07/2010   Procedure: ESOPHAGOGASTRODUODENOSCOPY (EGD);  Surgeon: Jamesetta So;  [facility];  Service: Gastroenterology;  Laterality: N/A;  . EYE SURGERY    . JOINT REPLACEMENT     Right knee  . Left Knee Arthroscopy    . LIVER BIOPSY N/A 05/01/2013   Procedure: LIVER BIOPSY;  Surgeon: Jamesetta So, MD;  [facility];  Service: General;  Laterality: N/A;  .  right knee     X 2  . TOTAL KNEE ARTHROPLASTY Left 09/17/2014   Procedure: TOTAL KNEE ARTHROPLASTY;  Surgeon: Vickey Huger, MD;  Location: [place];  Service: Orthopedics;  Laterality: Left;    There were no vitals filed for this visit.      Subjective Assessment - 10/11/15 1043    Subjective  S: it doesn't hurt. It just doesn't feel normal yet.    Currently in Pain? No/denies            [facility] OT Assessment - 10/11/15 1043      Assessment   Diagnosis Right RCR     Precautions   Precautions Shoulder   Type of Shoulder Precautions Per Dr. only complete A/ROM until follow up appointment.                   OT Treatments/Exercises (OP) - 10/11/15 1043      Exercises   Exercises Shoulder     Shoulder Exercises: Supine   Protraction PROM;5 reps   Horizontal ABduction PROM;5 reps   External Rotation PROM;5 reps   Internal Rotation PROM;5 reps   Flexion PROM;5 reps   ABduction PROM;5 reps     Shoulder Exercises: Prone   Other Prone Exercises Hughston exercises; 20X   Other Prone Exercises Abduction; 20X     Shoulder Exercises: Standing   Horizontal ABduction Theraband;10 reps   Theraband  Level (Shoulder Horizontal ABduction) Level 4 (Blue)   Other Standing Exercises PNF D1 flexion to D2 extension with red theraband; 10X     Shoulder Exercises: ROM/Strengthening   Other ROM/Strengthening Exercises Prone; scaption with thumbs up; 20X   Other ROM/Strengthening Exercises Push-up plus against wall; 1-2 minutes with physical assist for proper movement.     Manual Therapy   Manual Therapy Myofascial release   Manual therapy comments Manual therapy completed prior to exercises.    Myofascial Release Myofascial release and manual stretching completed to  RUE upper arm, trapezius, and scapularis region to decrease fascial restrictions and increase joint mobility in a pain free zone.                 OT Education - 10/11/15 1218    Education provided Yes    Education Details Patient was given updated HEP to include horizontal abduction and D1 to D2 PNF pattern. Told patient to D/C shoulder extension with theraband as it is too easy.    Person(s) Educated Patient   Methods Explanation;Demonstration;Verbal cues;Handout   Comprehension Returned demonstration;Verbalized understanding          OT Short Term Goals - 08/28/15 1114      OT SHORT TERM GOAL #1   Title Pt will be educated on HEP.    Time 4   Period Weeks     OT SHORT TERM GOAL #2   Title Pt will decrease pain to 5/10 or less in RUE to increase ability to perform daily tasks.    Time 4   Period Weeks     OT SHORT TERM GOAL #3   Title Pt will decrease fascial restrictions from mod to min amounts or less in the RUE to increase mobility required for daily task completion.    Time 4   Period Weeks     OT SHORT TERM GOAL #4   Title Pt will increase P/ROM to [facility] in RUE to increase participation in dressing tasks.    Time 4   Period Weeks     OT SHORT TERM GOAL #5   Title Pt will increase RUE strength to 3/5 to increase ability to perform grooming tasks.    Time 4   Period Weeks           OT Long Term Goals - 09/20/15 1214      OT LONG TERM GOAL #1   Title Pt will return to prior level of functioning and independence in daily and leisure tasks using RUE as dominant.    Time 8   Period Weeks   Status On-going     OT LONG TERM GOAL #2   Title Pt will decrease pain in the RUE to 2/10 or less to increase ability to use RUE as dominant during daily tasks.    Time 8   Period Weeks     OT LONG TERM GOAL #3   Title Pt will decrease fascial restrictions in RUE from min to trace amounts or less to improve flexibility required for daily and leisure tasks.    Time 8   Period Weeks   Status On-going     OT LONG TERM GOAL #4   Title Pt will increase A/ROM to [facility] in RUE to improve ability to reach into overhead cabinets during daily tasks.    Time 8   Period Weeks     OT  LONG TERM GOAL #5   Title Pt will increase RUE strength to 4+/5 to  improve ability to complete yardwork tasks.    Time 8   Period Weeks   Status On-going               Plan - 10/11/15 1044    Clinical Impression Statement A: Patient with no tightness or fascial restrictions noted this session. Patient continued to complete shoulder endurance exercises. Attempted push up plus position while standing at wall. Max verbal and physical cueing required to complete correctly.    Plan P: D/C passive stretching and myofascial release. Reassessment. Think about either holding therapy until MD ok's weights/strengthening or discharge completely from therapy.   OT Home Exercise Plan 8/25: D/C scapular tlheraband extension from HEP. Added horizontal abduction (blue band) and D1 to D2 shoulder (red band) to HEP.      Patient will benefit from skilled therapeutic intervention in order to improve the following deficits and impairments:  Decreased strength, Decreased knowledge of precautions, Pain, Impaired UE functional use, Decreased activity tolerance, Decreased range of motion, Increased fascial restricitons, Impaired flexibility  Visit Diagnosis: Other symptoms and signs involving the musculoskeletal system    Problem List Patient Active Problem List   Diagnosis Date Noted  . S/P total knee arthroplasty 09/17/2014  . Leg pain 10/14/2011   Ailene Ravel, OTR/L,CBIS  [phone]  10/11/2015, 12:30 PM  [place] [address] [place], [place], [address] Phone: [phone]   Fax:  [phone]  Name: Harold Nicholson MRN: [id number] Date of Birth: 13-Mar-1950

## 2015-10-11 NOTE — Patient Instructions (Signed)
ELASTIC BAND - HORIZONTAL ABDUCTION  Start by holding an elastic band or tubing with your arm out-stretched in front of you and across your body towards the opposite side.  Next, pull the elastic band or cord horizontally and outward as shown.   Your elbow should be straight or slightly bent the entire time.  Complete 10 reps with BLUE BAND.   PNF Pattern Fixed Below  Tie a knot in theraband and place in a door in a position near the floor. Grab the end of the theraband with the hand closest to the door and raise diagonally up watching your hand with your eyes as you pull the band. For the opposite hand, perform the same move; pulling diagonally up and watching your hand with your eyes.   Complete 10 reps with RED BAND.

## 2015-10-16 ENCOUNTER — Encounter (HOSPITAL_COMMUNITY): Payer: Self-pay

## 2015-10-16 ENCOUNTER — Ambulatory Visit (HOSPITAL_COMMUNITY): Payer: PPO

## 2015-10-16 DIAGNOSIS — R29898 Other symptoms and signs involving the musculoskeletal system: Secondary | ICD-10-CM

## 2015-10-16 NOTE — Therapy (Signed)
[place], [place], [address] Phone: [phone]   Fax:  [phone]  Occupational Therapy Treatment And reassessment Patient Details  Name: Harold Nicholson MRN: [id number] Date of Birth: 11/20/1950 Referring Provider: Dr. Charlotte Sanes  Encounter Date: 10/16/2015      OT End of Session - 10/16/15 1116    Visit Number 25   Number of Visits 25   Authorization Type [employer]   Authorization Time Period Before 29th visit   Authorization - Visit Number 25   Authorization - Number of Visits 29   OT Start Time 1030  reassessment   OT Stop Time 1110   OT Time Calculation (min) 40 min   Activity Tolerance Patient tolerated treatment well   Behavior During Therapy [facility] for tasks assessed/performed      Past Medical History:  Diagnosis Date  . Arthritis   . Complication of anesthesia    pt stated he stopped breathing during surgery  . Hiatal hernia   . Hypercholesteremia   . Hypertension   . Hypothyroidism   . Impaired fasting glucose   . Pure hyperglyceridemia   . Thyroid disease    Hypothyroid     Past Surgical History:  Procedure Laterality Date  . CHOLECYSTECTOMY    . CHOLECYSTECTOMY N/A 05/01/2013   Procedure: LAPAROSCOPIC CHOLECYSTECTOMY;  Surgeon: Jamesetta So, MD;  [facility];  Service: General;  Laterality: N/A;  . COLONOSCOPY  10/07/2010   Procedure: COLONOSCOPY;  Surgeon: Jamesetta So;  [facility];  Service: Gastroenterology;  Laterality: N/A;  . ESOPHAGOGASTRODUODENOSCOPY  10/07/2010   Procedure: ESOPHAGOGASTRODUODENOSCOPY (EGD);  Surgeon: Jamesetta So;  [facility];  Service: Gastroenterology;  Laterality: N/A;  . EYE SURGERY    . JOINT REPLACEMENT     Right knee  . Left Knee Arthroscopy    . LIVER BIOPSY N/A 05/01/2013   Procedure: LIVER BIOPSY;  Surgeon: Jamesetta So, MD;  [facility];  Service: General;  Laterality: N/A;  . right  knee     X 2  . TOTAL KNEE ARTHROPLASTY Left 09/17/2014   Procedure: TOTAL KNEE ARTHROPLASTY;  Surgeon: Vickey Huger, MD;  Location: [place];  Service: Orthopedics;  Laterality: Left;    There were no vitals filed for this visit.      Subjective Assessment - 10/16/15 1050    Subjective  S: It doesn't hurt. I'm not always able to hold something heavy straight out in front of me but I don't need to.    Currently in Pain? No/denies            [facility] OT Assessment - 10/16/15 1033      Assessment   Diagnosis Right RCR     Precautions   Precautions Shoulder   Type of Shoulder Precautions Per Dr. only complete A/ROM until follow up appointment.      AROM   Overall AROM Comments Assessed  seated. IR/er adducted.   AROM Assessment Site Shoulder   Right/Left Shoulder Right   Right Shoulder Flexion 180 Degrees  previous: same   Right Shoulder ABduction 180 Degrees  previous: same   Right Shoulder Internal Rotation 90 Degrees  previous: same   Right Shoulder External Rotation 90 Degrees  previous: 90     Strength   Overall Strength Comments Assessed seated. IR/er adducted.    Strength Assessment Site Shoulder   Right/Left Shoulder Right   Right Shoulder Flexion 4/5  previous:  4/5   Right Shoulder ABduction 4/5  previous: 4/5   Right Shoulder Internal Rotation 5/5  previous: 4+/5   Right Shoulder External Rotation 4+/5  previous: 4-/5                  OT Treatments/Exercises (OP) - 10/16/15 1111      Exercises   Exercises Shoulder     Shoulder Exercises: Standing   External Rotation Theraband;12 reps   Theraband Level (Shoulder External Rotation) Level 4 (Blue)   Internal Rotation Theraband;12 reps   Theraband Level (Shoulder Internal Rotation) Level 4 (Blue)   Flexion Theraband;10 reps   Theraband Level (Shoulder Flexion) Level 2 (Red)   ABduction Theraband;12 reps   Theraband Level (Shoulder ABduction) Level 4 (Blue)   Extension Theraband;15 reps    Theraband Level (Shoulder Extension) Level 4 (Blue)                OT Education - 10/16/15 1049    Education provided Yes   Education Details Patient was given additional blue theraband exercises (IR/er, abduction, flexion, and extension)   Person(s) Educated Patient   Methods Explanation;Demonstration;Verbal cues;Handout   Comprehension Returned demonstration;Verbalized understanding          OT Short Term Goals - 08/28/15 1114      OT SHORT TERM GOAL #1   Title Pt will be educated on HEP.    Time 4   Period Weeks     OT SHORT TERM GOAL #2   Title Pt will decrease pain to 5/10 or less in RUE to increase ability to perform daily tasks.    Time 4   Period Weeks     OT SHORT TERM GOAL #3   Title Pt will decrease fascial restrictions from mod to min amounts or less in the RUE to increase mobility required for daily task completion.    Time 4   Period Weeks     OT SHORT TERM GOAL #4   Title Pt will increase P/ROM to [facility] in RUE to increase participation in dressing tasks.    Time 4   Period Weeks     OT SHORT TERM GOAL #5   Title Pt will increase RUE strength to 3/5 to increase ability to perform grooming tasks.    Time 4   Period Weeks           OT Long Term Goals - 10/16/15 1038      OT LONG TERM GOAL #1   Title Pt will return to prior level of functioning and independence in daily and leisure tasks using RUE as dominant.    Time 8   Period Weeks   Status Achieved     OT LONG TERM GOAL #2   Title Pt will decrease pain in the RUE to 2/10 or less to increase ability to use RUE as dominant during daily tasks.    Time 8   Period Weeks     OT LONG TERM GOAL #3   Title Pt will decrease fascial restrictions in RUE from min to trace amounts or less to improve flexibility required for daily and leisure tasks.    Time 8   Period Weeks   Status Achieved     OT LONG TERM GOAL #4   Title Pt will increase A/ROM to [facility] in RUE to improve ability to reach into  overhead cabinets during daily tasks.    Time 8   Period Weeks     OT LONG TERM  GOAL #5   Title Pt will increase RUE strength to 4+/5 to improve ability to complete yardwork tasks.    Time 8   Period Weeks   Status Partially Met               Plan - 10/16/15 1116    Clinical Impression Statement A: Reassessment completed this date. patient has met all therapy goals except strength goal which he has partially met. patient shows slight weakness with shoulder flexion and abduction when assessed. Patient reports no issues or deficits. States that sometimes he'll lay his arm overh his head when he's sleeping and it might be uncomfortable after a while (2/10) and once he moves it he's fine. Recommend that we hold therapy at this point since we have not gotten the clearance to progress patient with weights from the MD. Patient has not needed any manual therapy or VC for form and technique. Pt agrees with recommendation. HEP was updated.    Plan P: Hold therapy until the end of September. Patient is to call and let us know if he will continue therapy or be discharged.    OT Home Exercise Plan 8/25: D/C scapular tlheraband extension from HEP. Added horizontal abduction (blue band) and D1 to D2 shoulder (red band) to HEP. 8/30: Added additional theraband exercises.       Patient will benefit from skilled therapeutic intervention in order to improve the following deficits and impairments:  Decreased strength, Decreased knowledge of precautions, Pain, Impaired UE functional use, Decreased activity tolerance, Decreased range of motion, Increased fascial restricitons, Impaired flexibility  Visit Diagnosis: Other symptoms and signs involving the musculoskeletal system    Problem List Patient Active Problem List   Diagnosis Date Noted  . S/P total knee arthroplasty 09/17/2014  . Leg pain 10/14/2011   Ailene Ravel, OTR/L,CBIS  [phone]  10/16/2015, 11:22 AM  [place] [address] [place], [place], [address] Phone: [phone]   Fax:  [phone]  Name: Harold Nicholson MRN: [id number] Date of Birth: 03/11/1950

## 2015-10-16 NOTE — Patient Instructions (Addendum)
     Resisted External Rotation: in Neutral - Bilateral   Sit or stand, tubing in both hands, elbows at sides, bent to 90, forearms forward. Pinch shoulder blades together and rotate forearms out. Keep elbows at sides. Repeat 10-12____ times per set. Do ____ sets per session. Do _2-3___ sessions per day.  http://orth.exer.us/966   Copyright  VHI. All rights reserved.   PNF Strengthening: Resisted   Standing, hold resistive band above head. Bring right arm down and out from side. Repeat _10-12___ times per set. Do ____ sets per session. Do _2-3___ sessions per day.  http://orth.exer.us/922   Copyright  VHI. All rights reserved.   ELASTIC BAND SHOULDER FLEXION  While holding an elastic band at your side, draw up your arm up in front of you keeping your elbow straight.  10-12 times. 2-3 times a day.   AAROM ELASTIC BAND FLEXION  Pull down on an elastic band and then allow the band to assist your shoulder to raise back up.  Complete 10-12 repetitions. Completed 2-3 times day.

## 2015-10-16 NOTE — Addendum Note (Signed)
Addended by: Ailene Ravel D on: 10/16/2015 11:26 AM   Modules accepted: Orders

## 2015-10-18 ENCOUNTER — Encounter (HOSPITAL_COMMUNITY): Payer: PPO | Admitting: Occupational Therapy

## 2015-10-23 ENCOUNTER — Encounter (HOSPITAL_COMMUNITY): Payer: PPO | Admitting: Occupational Therapy

## 2015-10-25 ENCOUNTER — Encounter (HOSPITAL_COMMUNITY): Payer: PPO | Admitting: Occupational Therapy

## 2015-10-30 ENCOUNTER — Encounter (HOSPITAL_COMMUNITY): Payer: PPO | Admitting: Occupational Therapy

## 2016-09-20 IMAGING — CR DG CHEST 2V
2 series · 2 of 2 positions shown · non-contrast
Comparison: None.

CLINICAL DATA: Preop knee replacement, hypertension.

EXAM:
CHEST  2 VIEW

[w chest pa]
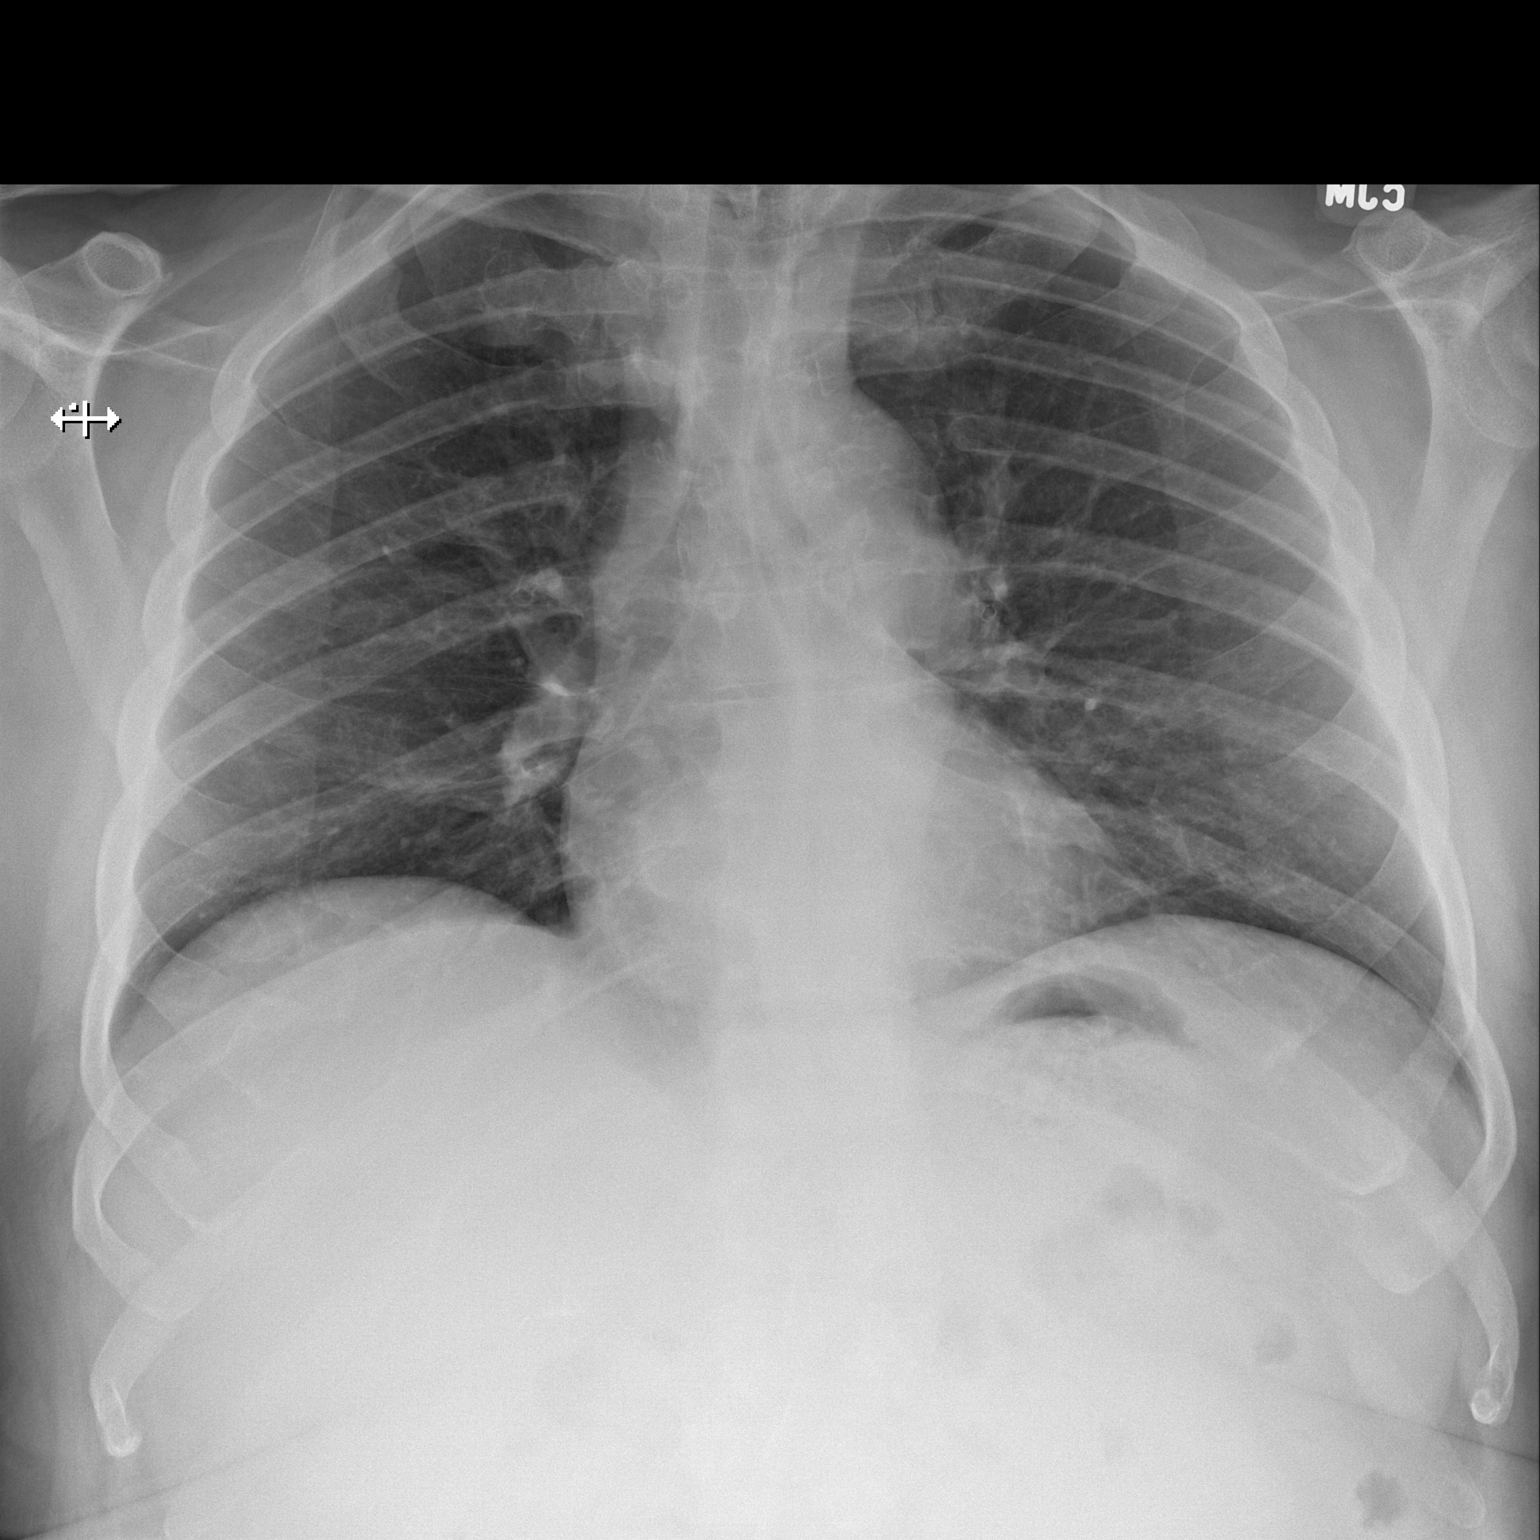

[w chest lat]
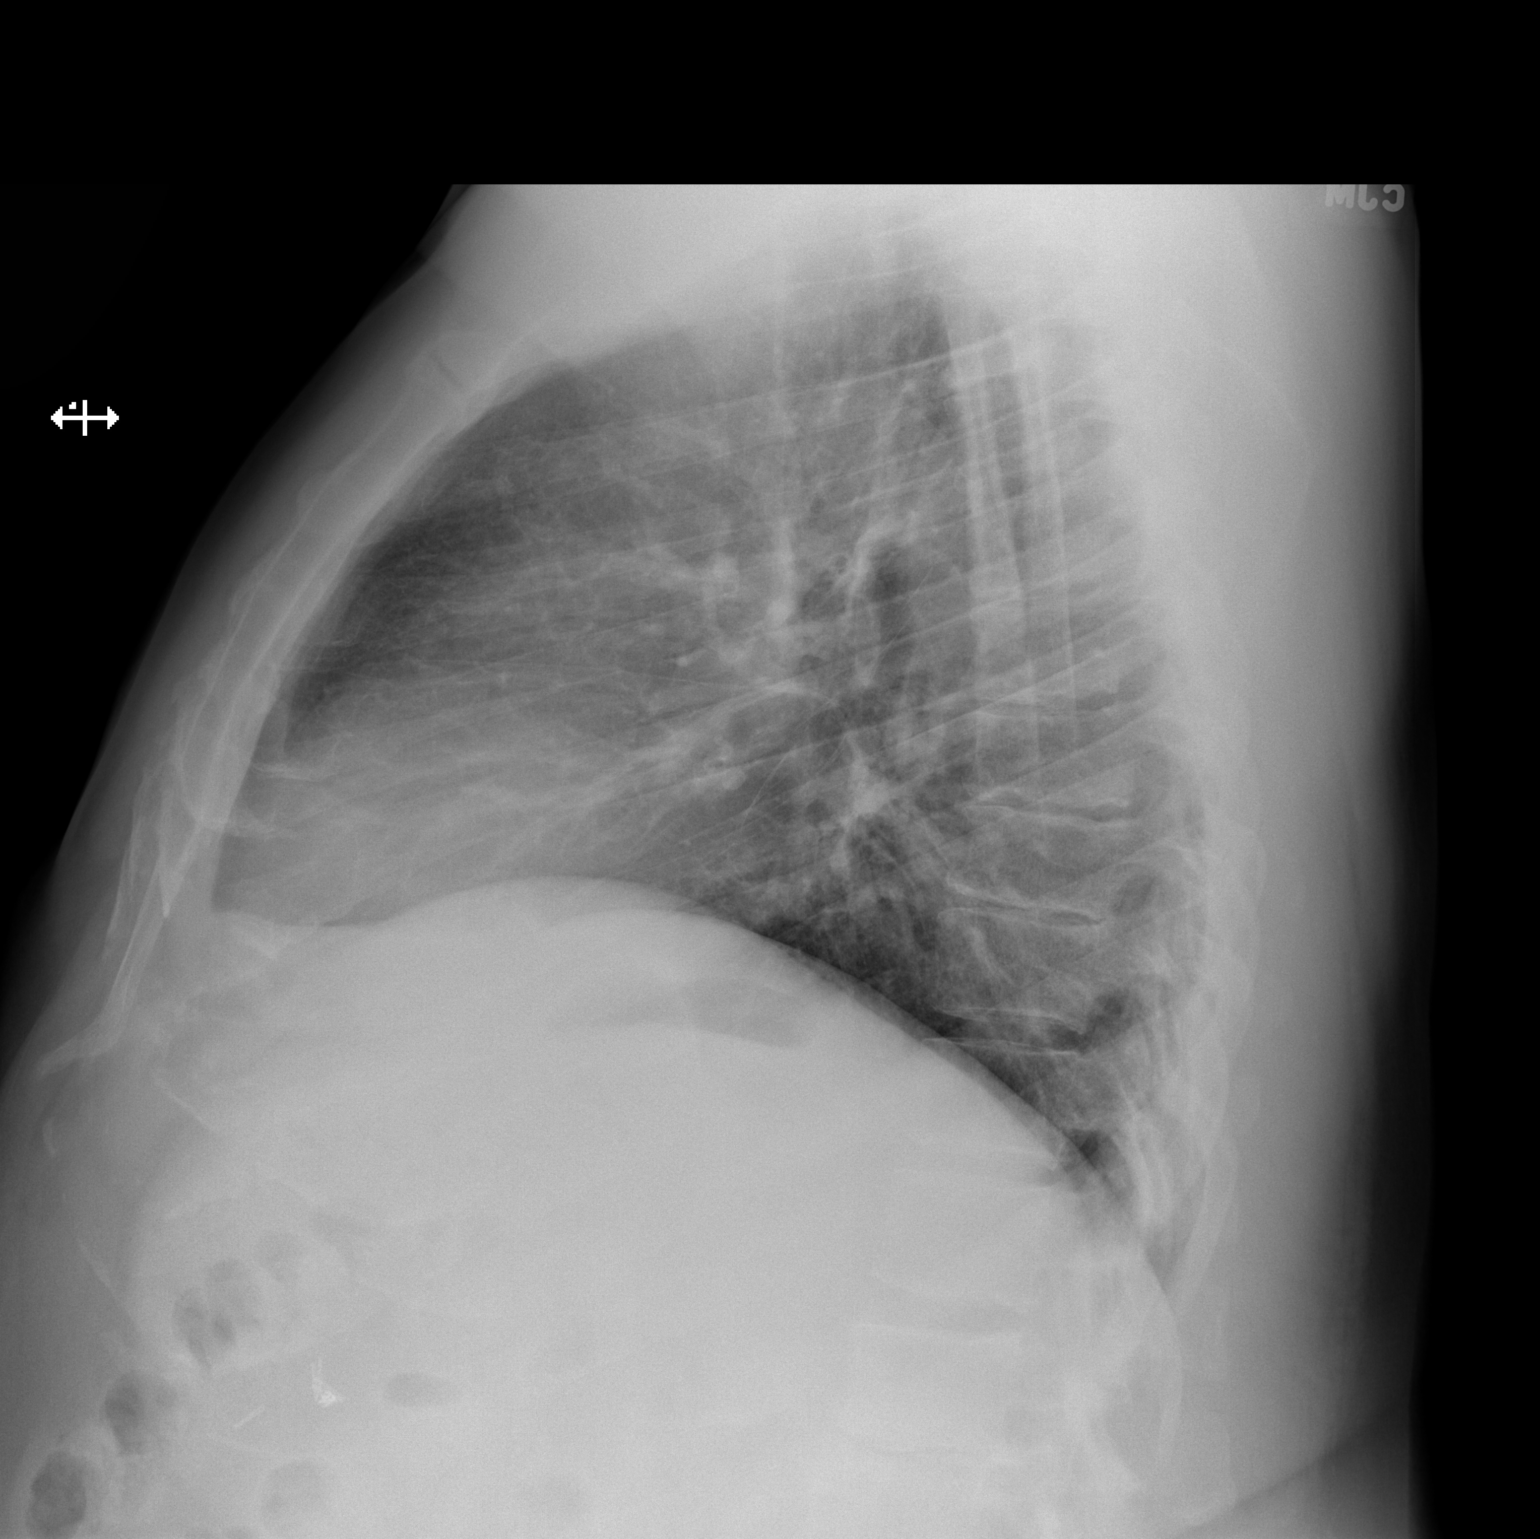

[2 of 2 positions shown; findings below may reference images not displayed]

FINDINGS: The heart size and mediastinal contours are within normal limits.
Both lungs are clear. The visualized skeletal structures are
unremarkable.
IMPRESSION: No active cardiopulmonary disease.

## 2017-11-23 ENCOUNTER — Encounter (HOSPITAL_COMMUNITY): Payer: Self-pay

## 2017-11-23 NOTE — Therapy (Signed)
[place], [place], [address] Phone: [phone]   Fax:  [phone]  Patient Details  Name: Harold Nicholson MRN: [id number] Date of Birth: [birth year] Referring Provider:  No ref. provider found  Encounter Date: 11/23/2017  OCCUPATIONAL THERAPY DISCHARGE SUMMARY  Visits from Start of Care: 25  Current functional level related to goals / functional outcomes: Pt's therapy was held on 10/16/15 until his follow up MD appointment in order to progress to weights. Patient did not return to therapy or call to inform us that he wanted to be discharged. Patient is officially discharged on this date from outpatient OT services.    Remaining deficits: Slight deficit in strength at session on 10/16/15.   Education / Equipment: Manufacturing engineer HEP Plan: Patient agrees to discharge.  Patient goals were met. Patient is being discharged due to meeting the stated rehab goals.  ?????         Ailene Ravel, OTR/L,CBIS  [phone]  11/23/2017, 4:29 PM  [facility] [address] [place], [place], [address] Phone: [phone]   Fax:  [phone]

## 2018-06-20 DIAGNOSIS — E559 Vitamin D deficiency, unspecified: Secondary | ICD-10-CM | POA: Diagnosis not present

## 2018-06-20 DIAGNOSIS — Z0001 Encounter for general adult medical examination with abnormal findings: Secondary | ICD-10-CM | POA: Diagnosis not present

## 2018-06-20 DIAGNOSIS — N183 Chronic kidney disease, stage 3 (moderate): Secondary | ICD-10-CM | POA: Diagnosis not present

## 2018-06-20 DIAGNOSIS — E782 Mixed hyperlipidemia: Secondary | ICD-10-CM | POA: Diagnosis not present

## 2018-06-20 DIAGNOSIS — E039 Hypothyroidism, unspecified: Secondary | ICD-10-CM | POA: Diagnosis not present

## 2018-06-20 DIAGNOSIS — Z1389 Encounter for screening for other disorder: Secondary | ICD-10-CM | POA: Diagnosis not present

## 2018-06-20 DIAGNOSIS — E7849 Other hyperlipidemia: Secondary | ICD-10-CM | POA: Diagnosis not present

## 2018-06-20 DIAGNOSIS — E6609 Other obesity due to excess calories: Secondary | ICD-10-CM | POA: Diagnosis not present

## 2018-06-20 DIAGNOSIS — Z6835 Body mass index (BMI) 35.0-35.9, adult: Secondary | ICD-10-CM | POA: Diagnosis not present

## 2018-06-20 DIAGNOSIS — I10 Essential (primary) hypertension: Secondary | ICD-10-CM | POA: Diagnosis not present

## 2019-01-05 DIAGNOSIS — E7849 Other hyperlipidemia: Secondary | ICD-10-CM | POA: Diagnosis not present

## 2019-01-05 DIAGNOSIS — Z23 Encounter for immunization: Secondary | ICD-10-CM | POA: Diagnosis not present

## 2019-01-05 DIAGNOSIS — E559 Vitamin D deficiency, unspecified: Secondary | ICD-10-CM | POA: Diagnosis not present

## 2019-01-05 DIAGNOSIS — Z6835 Body mass index (BMI) 35.0-35.9, adult: Secondary | ICD-10-CM | POA: Diagnosis not present

## 2019-01-05 DIAGNOSIS — E039 Hypothyroidism, unspecified: Secondary | ICD-10-CM | POA: Diagnosis not present

## 2019-01-05 DIAGNOSIS — Z1389 Encounter for screening for other disorder: Secondary | ICD-10-CM | POA: Diagnosis not present

## 2019-01-05 DIAGNOSIS — I10 Essential (primary) hypertension: Secondary | ICD-10-CM | POA: Diagnosis not present

## 2019-04-16 DIAGNOSIS — N183 Chronic kidney disease, stage 3 unspecified: Secondary | ICD-10-CM | POA: Diagnosis not present

## 2019-04-16 DIAGNOSIS — E063 Autoimmune thyroiditis: Secondary | ICD-10-CM | POA: Diagnosis not present

## 2019-04-16 DIAGNOSIS — E7849 Other hyperlipidemia: Secondary | ICD-10-CM | POA: Diagnosis not present

## 2019-04-16 DIAGNOSIS — I129 Hypertensive chronic kidney disease with stage 1 through stage 4 chronic kidney disease, or unspecified chronic kidney disease: Secondary | ICD-10-CM | POA: Diagnosis not present

## 2019-05-17 DIAGNOSIS — N183 Chronic kidney disease, stage 3 unspecified: Secondary | ICD-10-CM | POA: Diagnosis not present

## 2019-05-17 DIAGNOSIS — I129 Hypertensive chronic kidney disease with stage 1 through stage 4 chronic kidney disease, or unspecified chronic kidney disease: Secondary | ICD-10-CM | POA: Diagnosis not present

## 2019-05-17 DIAGNOSIS — E7849 Other hyperlipidemia: Secondary | ICD-10-CM | POA: Diagnosis not present

## 2019-05-17 DIAGNOSIS — E039 Hypothyroidism, unspecified: Secondary | ICD-10-CM | POA: Diagnosis not present

## 2019-05-23 DIAGNOSIS — I10 Essential (primary) hypertension: Secondary | ICD-10-CM | POA: Diagnosis not present

## 2019-05-23 DIAGNOSIS — Z0001 Encounter for general adult medical examination with abnormal findings: Secondary | ICD-10-CM | POA: Diagnosis not present

## 2019-05-23 DIAGNOSIS — E559 Vitamin D deficiency, unspecified: Secondary | ICD-10-CM | POA: Diagnosis not present

## 2019-05-23 DIAGNOSIS — Z1389 Encounter for screening for other disorder: Secondary | ICD-10-CM | POA: Diagnosis not present

## 2019-05-23 DIAGNOSIS — E039 Hypothyroidism, unspecified: Secondary | ICD-10-CM | POA: Diagnosis not present

## 2019-05-23 DIAGNOSIS — Z6834 Body mass index (BMI) 34.0-34.9, adult: Secondary | ICD-10-CM | POA: Diagnosis not present

## 2019-05-31 ENCOUNTER — Encounter: Payer: Self-pay | Admitting: Orthopedic Surgery

## 2019-05-31 ENCOUNTER — Ambulatory Visit: Payer: Medicare HMO

## 2019-05-31 ENCOUNTER — Other Ambulatory Visit: Payer: Self-pay

## 2019-05-31 ENCOUNTER — Ambulatory Visit: Payer: Medicare HMO | Admitting: Orthopedic Surgery

## 2019-05-31 VITALS — BP 161/94 | HR 62 | Ht 70.0 in | Wt 246.0 lb

## 2019-05-31 DIAGNOSIS — M85571 Aneurysmal bone cyst, right ankle and foot: Secondary | ICD-10-CM

## 2019-05-31 DIAGNOSIS — M19071 Primary osteoarthritis, right ankle and foot: Secondary | ICD-10-CM | POA: Diagnosis not present

## 2019-05-31 DIAGNOSIS — M19079 Primary osteoarthritis, unspecified ankle and foot: Secondary | ICD-10-CM

## 2019-05-31 DIAGNOSIS — M25571 Pain in right ankle and joints of right foot: Secondary | ICD-10-CM

## 2019-05-31 NOTE — Progress Notes (Signed)
Chief Complaint  Patient presents with  . Ankle Pain    right     69 year old male presents with a right ankle pain and swelling for years seems to be getting worse.  Seems to have good and bad days.  He has had multiple ankle injuries nothing requiring surgery but requires a episode where he jumped off of [place] building a bridge in a lot of pain and swelling in the ankle and was told he had a fracture.  Review of systems he reported nothing is positive except easy bruising and bleeding  Past Medical History:  Diagnosis Date  . Arthritis   . Complication of anesthesia    pt stated he stopped breathing during surgery  . Hiatal hernia   . Hypercholesteremia   . Hypertension   . Hypothyroidism   . Impaired fasting glucose   . Pure hyperglyceridemia   . Thyroid disease    Hypothyroid    Past Surgical History:  Procedure Laterality Date  . CHOLECYSTECTOMY    . CHOLECYSTECTOMY N/A 05/01/2013   Procedure: LAPAROSCOPIC CHOLECYSTECTOMY;  Surgeon: Jamesetta So, MD;  [facility];  Service: General;  Laterality: N/A;  . COLONOSCOPY  10/07/2010   Procedure: COLONOSCOPY;  Surgeon: Jamesetta So;  [facility];  Service: Gastroenterology;  Laterality: N/A;  . ESOPHAGOGASTRODUODENOSCOPY  10/07/2010   Procedure: ESOPHAGOGASTRODUODENOSCOPY (EGD);  Surgeon: Jamesetta So;  [facility];  Service: Gastroenterology;  Laterality: N/A;  . EYE SURGERY    . JOINT REPLACEMENT     Right knee  . Left Knee Arthroscopy    . LIVER BIOPSY N/A 05/01/2013   Procedure: LIVER BIOPSY;  Surgeon: Jamesetta So, MD;  Location: [facility];  Service: General;  Laterality: N/A;  . right knee     X 2  . TOTAL KNEE ARTHROPLASTY Left 09/17/2014   Procedure: TOTAL KNEE ARTHROPLASTY;  Surgeon: Vickey Huger, MD;  Location: [facility];  Service: Orthopedics;  Laterality: Left;   Social History   Tobacco Use  . Smoking status: Never Smoker  . Smokeless tobacco: Current User    Types: Snuff   Substance Use Topics  . Alcohol use: No    Comment: quit drinking 3 years ago  . Drug use: No   Family History  Problem Relation Age of Onset  . Diabetes Mother   . Hyperlipidemia Mother   . Hypertension Mother   . Hyperlipidemia Father   . Hypertension Father   . Diabetes Sister   . Hearing loss Sister   . Hyperlipidemia Sister   . Hypertension Sister   . Diabetes Brother   . Hyperlipidemia Brother   . Hypertension Brother     BP (!) 161/94   Pulse 62   Ht 5\' 10"  (1.778 m)   Wt 246 lb (111.6 kg)   BMI 35.30 kg/m   Appearance is normal height weight appropriate oriented x3 mood affect pleasant slight limp favoring right leg  Right ankle is swollen extend to have a pes planus flexible deformity with increased eversion he has a mild limp his ankle motion is intact he has pain in the lateral corner of the ankle he has no pain in the calcaneus ankle joint is stable no atrophy skin looks good pulses are good sensations normal  His x-ray shows a large cystic lesion with calcification in the middle of the lesion in the calcaneus and then he has multiple bone spurs around the ankle joint with some asymmetry symmetry of the ankle  mortise and possible talar dome lesion at the superolateral corner of the ankle  Encounter Diagnoses  Name Primary?  . Pain in right ankle and joints of right foot Yes  . Ankle arthritis   . Aneurysmal bone cyst of right foot     CT with and without contrast to evaluate aneurysmal bone cyst and an ankle orthosis for his ankle arthritis

## 2019-06-01 LAB — BASIC METABOLIC PANEL
BUN/Creatinine Ratio: 13 (calc) (ref 6–22)
BUN: 24 mg/dL (ref 7–25)
CO2: 28 mmol/L (ref 20–32)
Calcium: 9.5 mg/dL (ref 8.6–10.3)
Chloride: 105 mmol/L (ref 98–110)
Creat: 1.88 mg/dL — ABNORMAL HIGH (ref 0.70–1.25)
Glucose, Bld: 91 mg/dL (ref 65–139)
Potassium: 4.5 mmol/L (ref 3.5–5.3)
Sodium: 138 mmol/L (ref 135–146)

## 2019-06-13 ENCOUNTER — Telehealth: Payer: Self-pay | Admitting: Orthopedic Surgery

## 2019-06-13 NOTE — Telephone Encounter (Signed)
Call (voice message) received from [place] at [employer], requesting clarification; please call [phone].

## 2019-06-13 NOTE — Telephone Encounter (Signed)
Dr Aline Brochure ordered an ankle orthosis for him. He is asking about rigid support vs off shelf lace up support. I have advised he has changes around talus and he needs a more rigid support. We would have provided lace up orthosis, but not enough support for his problems.  Mortimer Fries agrees, and will work on getting him a more rigid ankle orthosis  To you [employer]

## 2019-06-16 DIAGNOSIS — N183 Chronic kidney disease, stage 3 unspecified: Secondary | ICD-10-CM | POA: Diagnosis not present

## 2019-06-16 DIAGNOSIS — I129 Hypertensive chronic kidney disease with stage 1 through stage 4 chronic kidney disease, or unspecified chronic kidney disease: Secondary | ICD-10-CM | POA: Diagnosis not present

## 2019-06-16 DIAGNOSIS — E7849 Other hyperlipidemia: Secondary | ICD-10-CM | POA: Diagnosis not present

## 2019-06-16 DIAGNOSIS — E039 Hypothyroidism, unspecified: Secondary | ICD-10-CM | POA: Diagnosis not present

## 2019-06-20 ENCOUNTER — Other Ambulatory Visit: Payer: Self-pay

## 2019-06-20 ENCOUNTER — Ambulatory Visit (HOSPITAL_COMMUNITY)
Admission: RE | Admit: 2019-06-20 | Discharge: 2019-06-20 | Disposition: A | Discharge status: Home / Self Care | Payer: Medicare HMO | Source: Ambulatory Visit | Attending: Orthopedic Surgery | Admitting: Orthopedic Surgery

## 2019-06-20 DIAGNOSIS — M25571 Pain in right ankle and joints of right foot: Secondary | ICD-10-CM | POA: Diagnosis not present

## 2019-06-20 DIAGNOSIS — M19071 Primary osteoarthritis, right ankle and foot: Secondary | ICD-10-CM | POA: Diagnosis not present

## 2019-06-20 MED ORDER — IOHEXOL 300 MG/ML  SOLN
75.0000 mL | Freq: Once | INTRAMUSCULAR | Status: AC | PRN
  Administered 2019-06-20: 17:00:00 60 mL via INTRAVENOUS

## 2019-07-04 ENCOUNTER — Encounter: Payer: Self-pay | Admitting: Orthopedic Surgery

## 2019-07-04 ENCOUNTER — Ambulatory Visit: Payer: Medicare HMO | Admitting: Orthopedic Surgery

## 2019-07-04 ENCOUNTER — Other Ambulatory Visit: Payer: Self-pay

## 2019-07-04 VITALS — BP 176/90 | HR 57 | Ht 73.0 in | Wt 250.0 lb

## 2019-07-04 DIAGNOSIS — M19079 Primary osteoarthritis, unspecified ankle and foot: Secondary | ICD-10-CM | POA: Diagnosis not present

## 2019-07-04 DIAGNOSIS — D1723 Benign lipomatous neoplasm of skin and subcutaneous tissue of right leg: Secondary | ICD-10-CM | POA: Diagnosis not present

## 2019-07-04 NOTE — Progress Notes (Signed)
Chief Complaint  Patient presents with  . Ankle Pain    right ankle review CT scan    69 year old male in the process of working up his ankle arthritis it was found he had a large osseous lesion in his calcaneus.  We sent him for CT scan.  CT scan which I read independently shows a large intraosseous ganglion and arthritis on the one side of the ankle joint  We can observe the interosseous lipoma there is some risk of trauma and fracture  He will also get it double upright ankle brace for his ankle arthritis which is more definitive on CAT scan  8 weeks after he gets his brace I want him to come in and see if that helped if not we can have him seen by foot and ankle specialist for possible fusion although the lipoma may preclude a fusion and is not really feeling the fusion but he may be a candidate for ankle joint replacement  Encounter Diagnoses  Name Primary?  Marland Kitchen Ankle arthritis Yes  . Lipoma of right lower extremity

## 2019-07-28 DIAGNOSIS — H43812 Vitreous degeneration, left eye: Secondary | ICD-10-CM | POA: Diagnosis not present

## 2019-08-16 DIAGNOSIS — N183 Chronic kidney disease, stage 3 unspecified: Secondary | ICD-10-CM | POA: Diagnosis not present

## 2019-08-16 DIAGNOSIS — E7849 Other hyperlipidemia: Secondary | ICD-10-CM | POA: Diagnosis not present

## 2019-08-16 DIAGNOSIS — E039 Hypothyroidism, unspecified: Secondary | ICD-10-CM | POA: Diagnosis not present

## 2019-08-16 DIAGNOSIS — I129 Hypertensive chronic kidney disease with stage 1 through stage 4 chronic kidney disease, or unspecified chronic kidney disease: Secondary | ICD-10-CM | POA: Diagnosis not present

## 2019-08-24 DIAGNOSIS — M19071 Primary osteoarthritis, right ankle and foot: Secondary | ICD-10-CM | POA: Diagnosis not present

## 2019-09-15 DIAGNOSIS — E039 Hypothyroidism, unspecified: Secondary | ICD-10-CM | POA: Diagnosis not present

## 2019-09-15 DIAGNOSIS — I129 Hypertensive chronic kidney disease with stage 1 through stage 4 chronic kidney disease, or unspecified chronic kidney disease: Secondary | ICD-10-CM | POA: Diagnosis not present

## 2019-09-15 DIAGNOSIS — E7849 Other hyperlipidemia: Secondary | ICD-10-CM | POA: Diagnosis not present

## 2019-09-15 DIAGNOSIS — N1832 Chronic kidney disease, stage 3b: Secondary | ICD-10-CM | POA: Diagnosis not present

## 2019-10-17 DIAGNOSIS — N1832 Chronic kidney disease, stage 3b: Secondary | ICD-10-CM | POA: Diagnosis not present

## 2019-10-17 DIAGNOSIS — I129 Hypertensive chronic kidney disease with stage 1 through stage 4 chronic kidney disease, or unspecified chronic kidney disease: Secondary | ICD-10-CM | POA: Diagnosis not present

## 2019-10-17 DIAGNOSIS — M19071 Primary osteoarthritis, right ankle and foot: Secondary | ICD-10-CM | POA: Diagnosis not present

## 2019-10-17 DIAGNOSIS — E039 Hypothyroidism, unspecified: Secondary | ICD-10-CM | POA: Diagnosis not present

## 2019-10-17 DIAGNOSIS — E559 Vitamin D deficiency, unspecified: Secondary | ICD-10-CM | POA: Diagnosis not present

## 2019-11-13 ENCOUNTER — Other Ambulatory Visit: Payer: Self-pay

## 2019-11-13 ENCOUNTER — Encounter: Payer: Self-pay | Admitting: Orthopedic Surgery

## 2019-11-13 ENCOUNTER — Ambulatory Visit: Payer: Medicare HMO | Admitting: Orthopedic Surgery

## 2019-11-13 VITALS — BP 168/78 | HR 56 | Ht 73.0 in | Wt 250.0 lb

## 2019-11-13 DIAGNOSIS — M19071 Primary osteoarthritis, right ankle and foot: Secondary | ICD-10-CM

## 2019-11-13 DIAGNOSIS — M19079 Primary osteoarthritis, unspecified ankle and foot: Secondary | ICD-10-CM

## 2019-11-13 NOTE — Progress Notes (Signed)
Chief Complaint  Patient presents with  . Ankle Pain    has brace for right ankle     Assessment and plan   Encounter Diagnosis  Name Primary?  Marland Kitchen Ankle arthritis Yes    osteoarthritis right ankle patient now in a lace up brace that he wears inside the shoe.  Other than some slight irritation from the brace he notices decrease pain and swelling  Follow-up in a year for x-ray  Routine follow-up for this 69 year old male has osteoarthritis of his right ankle.  He was placed in a brace see media for the picture of the brace he is walking with less noticeable limp.  His pain is better brace is fitting well  Recommend follow-up in a year

## 2019-11-13 NOTE — Patient Instructions (Signed)
Wear brace for activity  ?

## 2019-11-16 DIAGNOSIS — E7849 Other hyperlipidemia: Secondary | ICD-10-CM | POA: Diagnosis not present

## 2019-11-16 DIAGNOSIS — I129 Hypertensive chronic kidney disease with stage 1 through stage 4 chronic kidney disease, or unspecified chronic kidney disease: Secondary | ICD-10-CM | POA: Diagnosis not present

## 2019-11-16 DIAGNOSIS — E039 Hypothyroidism, unspecified: Secondary | ICD-10-CM | POA: Diagnosis not present

## 2019-12-16 DIAGNOSIS — N183 Chronic kidney disease, stage 3 unspecified: Secondary | ICD-10-CM | POA: Diagnosis not present

## 2019-12-16 DIAGNOSIS — I129 Hypertensive chronic kidney disease with stage 1 through stage 4 chronic kidney disease, or unspecified chronic kidney disease: Secondary | ICD-10-CM | POA: Diagnosis not present

## 2019-12-16 DIAGNOSIS — E7849 Other hyperlipidemia: Secondary | ICD-10-CM | POA: Diagnosis not present

## 2019-12-16 DIAGNOSIS — E039 Hypothyroidism, unspecified: Secondary | ICD-10-CM | POA: Diagnosis not present

## 2020-01-03 DIAGNOSIS — M19079 Primary osteoarthritis, unspecified ankle and foot: Secondary | ICD-10-CM | POA: Diagnosis not present

## 2020-01-03 DIAGNOSIS — Z6835 Body mass index (BMI) 35.0-35.9, adult: Secondary | ICD-10-CM | POA: Diagnosis not present

## 2020-01-03 DIAGNOSIS — Z125 Encounter for screening for malignant neoplasm of prostate: Secondary | ICD-10-CM | POA: Diagnosis not present

## 2020-01-03 DIAGNOSIS — R972 Elevated prostate specific antigen [PSA]: Secondary | ICD-10-CM | POA: Diagnosis not present

## 2020-01-03 DIAGNOSIS — Z7189 Other specified counseling: Secondary | ICD-10-CM | POA: Diagnosis not present

## 2020-01-03 DIAGNOSIS — Z86010 Personal history of colonic polyps: Secondary | ICD-10-CM | POA: Diagnosis not present

## 2020-01-03 DIAGNOSIS — E559 Vitamin D deficiency, unspecified: Secondary | ICD-10-CM | POA: Diagnosis not present

## 2020-01-03 DIAGNOSIS — I10 Essential (primary) hypertension: Secondary | ICD-10-CM | POA: Diagnosis not present

## 2020-01-03 DIAGNOSIS — E7849 Other hyperlipidemia: Secondary | ICD-10-CM | POA: Diagnosis not present

## 2020-02-16 DIAGNOSIS — I129 Hypertensive chronic kidney disease with stage 1 through stage 4 chronic kidney disease, or unspecified chronic kidney disease: Secondary | ICD-10-CM | POA: Diagnosis not present

## 2020-02-16 DIAGNOSIS — E039 Hypothyroidism, unspecified: Secondary | ICD-10-CM | POA: Diagnosis not present

## 2020-02-16 DIAGNOSIS — E7849 Other hyperlipidemia: Secondary | ICD-10-CM | POA: Diagnosis not present

## 2020-02-16 DIAGNOSIS — N183 Chronic kidney disease, stage 3 unspecified: Secondary | ICD-10-CM | POA: Diagnosis not present

## 2020-03-01 DIAGNOSIS — D170 Benign lipomatous neoplasm of skin and subcutaneous tissue of head, face and neck: Secondary | ICD-10-CM | POA: Diagnosis not present

## 2020-05-23 DIAGNOSIS — N183 Chronic kidney disease, stage 3 unspecified: Secondary | ICD-10-CM | POA: Diagnosis not present

## 2020-05-23 DIAGNOSIS — E039 Hypothyroidism, unspecified: Secondary | ICD-10-CM | POA: Diagnosis not present

## 2020-05-23 DIAGNOSIS — Z1389 Encounter for screening for other disorder: Secondary | ICD-10-CM | POA: Diagnosis not present

## 2020-05-23 DIAGNOSIS — I10 Essential (primary) hypertension: Secondary | ICD-10-CM | POA: Diagnosis not present

## 2020-05-23 DIAGNOSIS — Z6834 Body mass index (BMI) 34.0-34.9, adult: Secondary | ICD-10-CM | POA: Diagnosis not present

## 2020-05-23 DIAGNOSIS — E559 Vitamin D deficiency, unspecified: Secondary | ICD-10-CM | POA: Diagnosis not present

## 2020-05-23 DIAGNOSIS — E7849 Other hyperlipidemia: Secondary | ICD-10-CM | POA: Diagnosis not present

## 2020-05-23 DIAGNOSIS — Z1331 Encounter for screening for depression: Secondary | ICD-10-CM | POA: Diagnosis not present

## 2020-05-23 DIAGNOSIS — Z0001 Encounter for general adult medical examination with abnormal findings: Secondary | ICD-10-CM | POA: Diagnosis not present

## 2020-11-11 ENCOUNTER — Other Ambulatory Visit: Payer: Self-pay

## 2020-11-11 ENCOUNTER — Ambulatory Visit: Payer: Medicare HMO | Admitting: Orthopedic Surgery

## 2020-11-11 ENCOUNTER — Encounter: Payer: Self-pay | Admitting: Orthopedic Surgery

## 2020-11-11 ENCOUNTER — Ambulatory Visit: Payer: Medicare HMO

## 2020-11-11 VITALS — BP 178/81 | HR 59 | Ht 73.0 in | Wt 250.0 lb

## 2020-11-11 DIAGNOSIS — M19079 Primary osteoarthritis, unspecified ankle and foot: Secondary | ICD-10-CM

## 2020-11-11 DIAGNOSIS — M19071 Primary osteoarthritis, right ankle and foot: Secondary | ICD-10-CM | POA: Diagnosis not present

## 2020-11-11 NOTE — Progress Notes (Signed)
Chief Complaint  Patient presents with   Ankle Pain    Right ankle feeling better states brace too hot to wear in summer     Encounter Diagnosis  Name Primary?   Ankle arthritis Yes    70 year old male with ankle arthritis was treated with a brace he is here for 1 year follow-up and to check on whether the brace is still working for him and to x-ray the ankle to determine if the joint is worse  Audry Pili is doing well he has some occasional trouble going up the steps but other than that he was able to walk even without the brace the summer.  He says it does feel better when the brace is on but right now it is too hot  I encouraged him to wear it once he gets school again and see him in a year for x-rays  Our examination today revealed he still has very good motion he does have some tenderness anteriorly especially anterolaterally no atrophy in the muscles the skin is good he is neurovascular intact he is walking without support

## 2020-12-16 DIAGNOSIS — E6609 Other obesity due to excess calories: Secondary | ICD-10-CM | POA: Diagnosis not present

## 2020-12-16 DIAGNOSIS — Z6833 Body mass index (BMI) 33.0-33.9, adult: Secondary | ICD-10-CM | POA: Diagnosis not present

## 2020-12-16 DIAGNOSIS — E782 Mixed hyperlipidemia: Secondary | ICD-10-CM | POA: Diagnosis not present

## 2020-12-16 DIAGNOSIS — E7849 Other hyperlipidemia: Secondary | ICD-10-CM | POA: Diagnosis not present

## 2020-12-16 DIAGNOSIS — E559 Vitamin D deficiency, unspecified: Secondary | ICD-10-CM | POA: Diagnosis not present

## 2020-12-16 DIAGNOSIS — Z23 Encounter for immunization: Secondary | ICD-10-CM | POA: Diagnosis not present

## 2020-12-16 DIAGNOSIS — R972 Elevated prostate specific antigen [PSA]: Secondary | ICD-10-CM | POA: Diagnosis not present

## 2020-12-16 DIAGNOSIS — I10 Essential (primary) hypertension: Secondary | ICD-10-CM | POA: Diagnosis not present

## 2020-12-16 DIAGNOSIS — E039 Hypothyroidism, unspecified: Secondary | ICD-10-CM | POA: Diagnosis not present

## 2021-06-29 IMAGING — CT CT ANKLE*R* W/CM
3 series · 9 of 33 positions shown, 11 images · IV contrast (omnipaque)
Comparison: None.

CLINICAL DATA: Pt presents with a right ankle pain and swelling for
years seems to be getting worse over last month. Seems to have good
and bad days. He has had multiple ankle injuries nothing requiring
surgery.

EXAM:
CT OF THE RIGHT ANKLE WITH CONTRAST
TECHNIQUE: Multidetector CT imaging of the right ankle was performed following
the standard protocol during bolus administration of intravenous
contrast.
CONTRAST:  60mL OMNIPAQUE IOHEXOL 300 MG/ML  SOLN

[Series 13: ax axial st · axial · 0.19mm/px · z∈[+33,+33]mm · 1 of 200 slices shown, 2 images]
[im 108/200  soft-tissue]
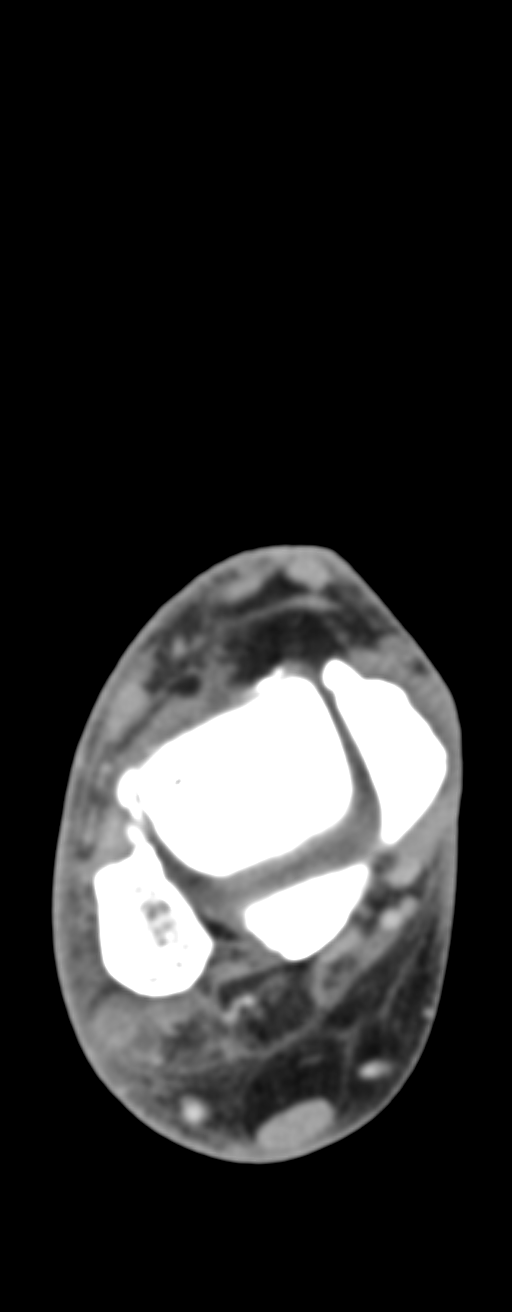
[im 108/200  bone]
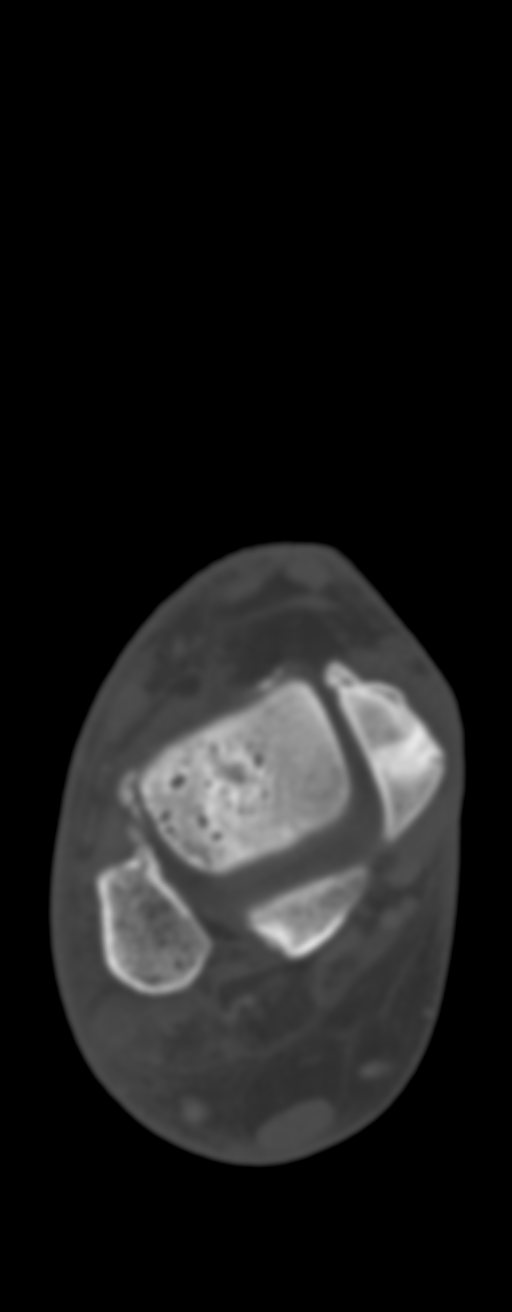

[Series 14: sag st · sagittal · 0.41mm/px · 5 of 117 slices shown, 6 images]
[im 39/117  bone]
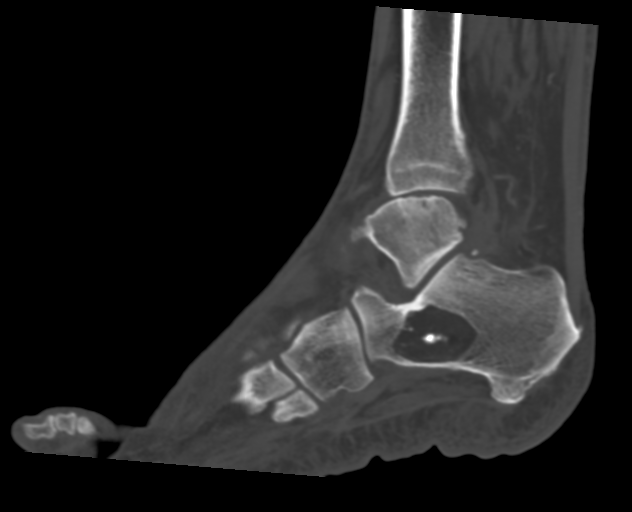
[im 49/117  bone]
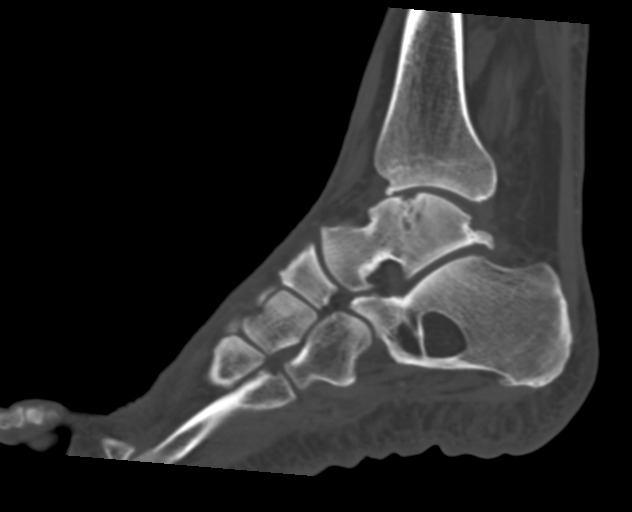
[im 59/117  soft-tissue]
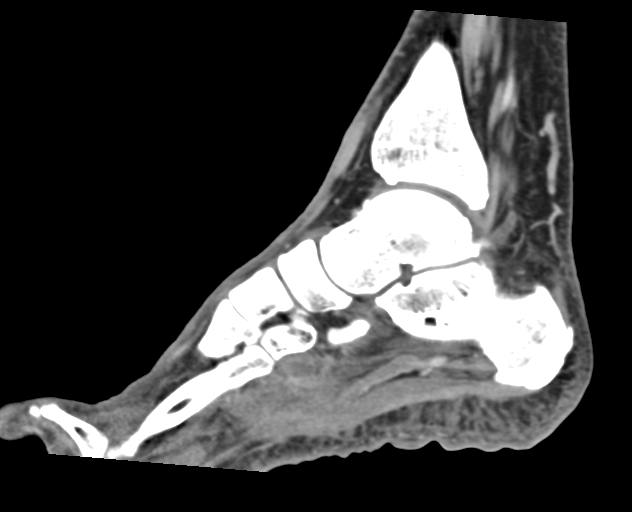
[im 59/117  bone]
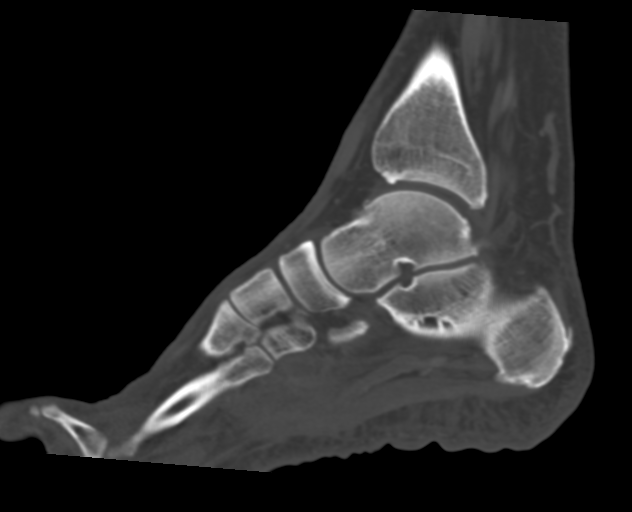
[im 68/117  bone]
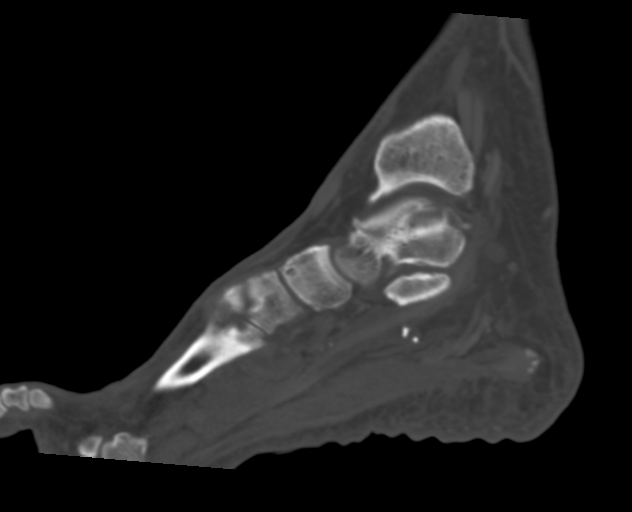
[im 78/117  bone]
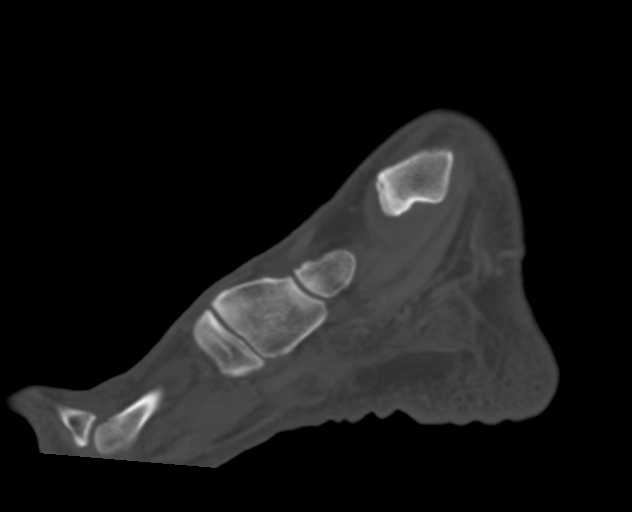

[Series 15: cor st · coronal · 0.19mm/px · 3 of 246 slices shown]
[im 50/246  bone]
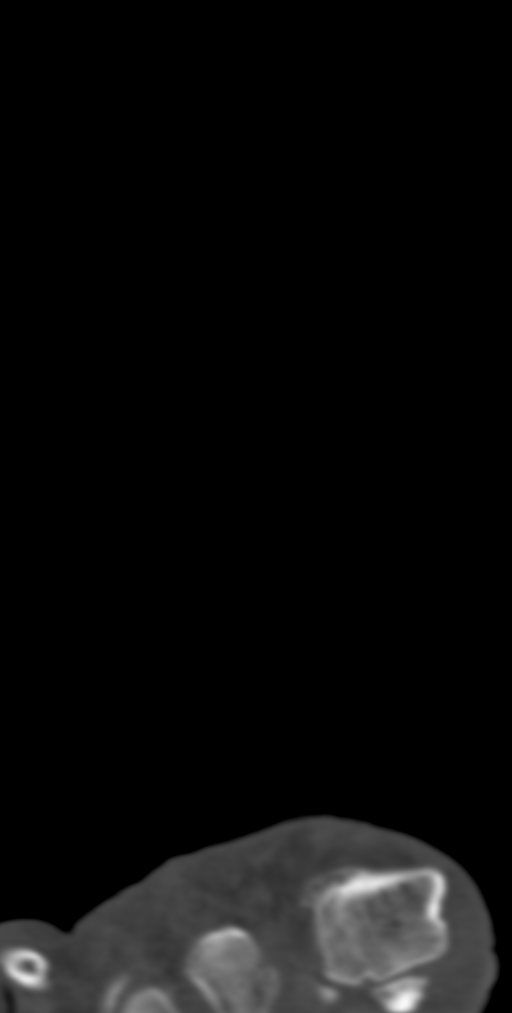
[im 99/246  bone]
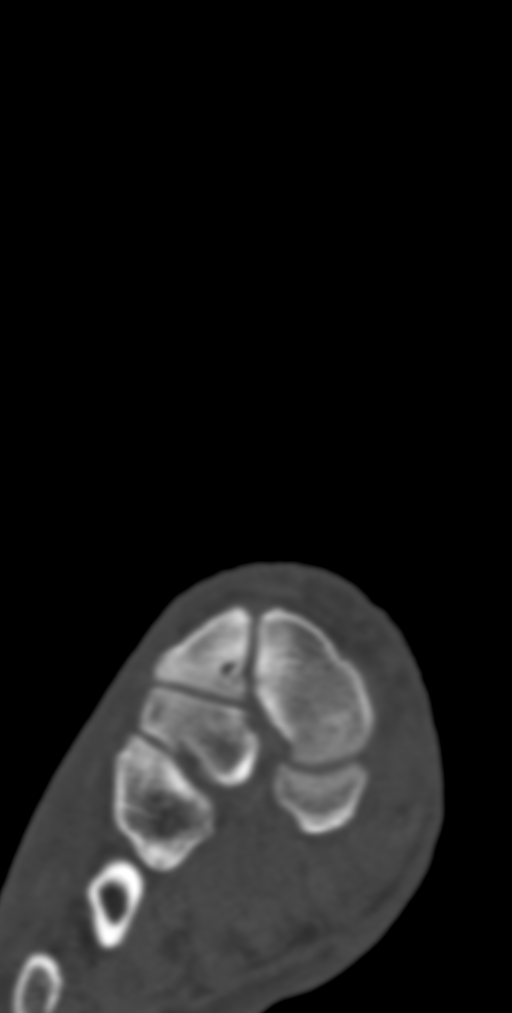
[im 148/246  bone]
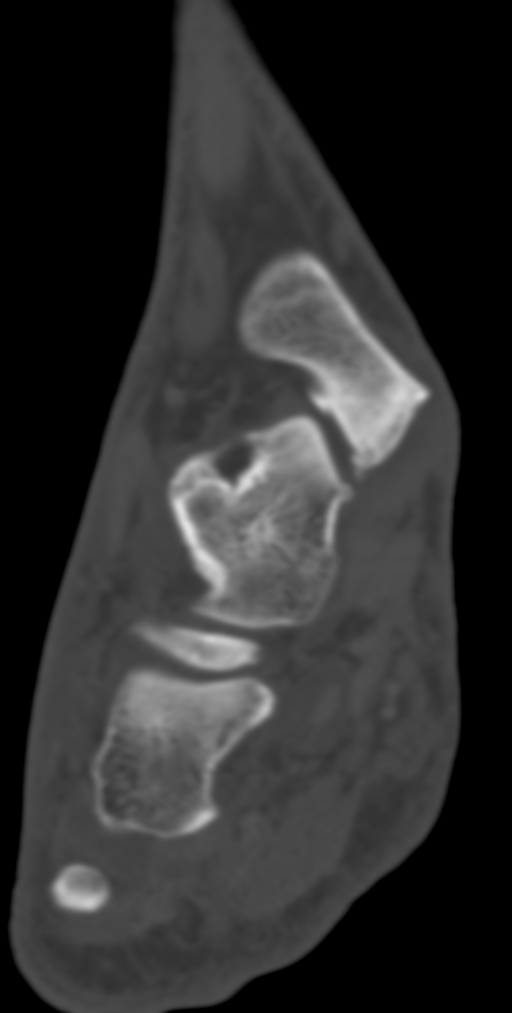

[9 of 33 positions shown; findings below may reference images not displayed]

FINDINGS: Bones/Joint/Cartilage

No fracture or dislocation. Normal alignment.

Severe tibiotalar joint space narrowing with subchondral cystic
changes particularly in the talus consistent with advanced
osteoarthritis most severe along the lateral tibiotalar joint.
Subcortical reactive cystic changes in the lateral most aspect of
the talus adjacent to the distal fibula.

Mild osteoarthritis of the subtalar joints. Mild osteoarthritis of
talonavicular joint. Mild osteoarthritis of the second TMT joint.

3.1 x 2.4 x 2.8 cm lipomatous intraosseous mass with internal
dystrophic calcification in the mid calcaneus consistent with an
intraosseous lipoma. No further evaluation recommended.

Ligaments

Ligaments are suboptimally evaluated by CT.

Muscles and Tendons
Muscles are normal. Severe expansion of the peroneus brevis and
peroneus longus tendons consistent with tendinosis with
tenosynovitis. Flexor compartment tendons are grossly intact.
Extensor compartment tendons are grossly intact. Achilles tendon is
intact. Plantar fascia is intact.

Soft tissue
No fluid collection or hematoma. No soft tissue mass.
IMPRESSION: 1. No acute osseous injury of the right ankle.
2. Advanced osteoarthritis of the tibiotalar joint.
3. Severe tendinosis of the peroneus brevis and peroneus longus
tendons with tenosynovitis.
4. A 3.1 x 2.4 x 2.8 cm intraosseous lipoma in the mid calcaneus. No
further evaluation recommended.

## 2021-07-18 DIAGNOSIS — E039 Hypothyroidism, unspecified: Secondary | ICD-10-CM | POA: Diagnosis not present

## 2021-07-18 DIAGNOSIS — R972 Elevated prostate specific antigen [PSA]: Secondary | ICD-10-CM | POA: Diagnosis not present

## 2021-07-18 DIAGNOSIS — Z6831 Body mass index (BMI) 31.0-31.9, adult: Secondary | ICD-10-CM | POA: Diagnosis not present

## 2021-07-18 DIAGNOSIS — Z0001 Encounter for general adult medical examination with abnormal findings: Secondary | ICD-10-CM | POA: Diagnosis not present

## 2021-07-18 DIAGNOSIS — E559 Vitamin D deficiency, unspecified: Secondary | ICD-10-CM | POA: Diagnosis not present

## 2021-07-18 DIAGNOSIS — Z125 Encounter for screening for malignant neoplasm of prostate: Secondary | ICD-10-CM | POA: Diagnosis not present

## 2021-07-18 DIAGNOSIS — E782 Mixed hyperlipidemia: Secondary | ICD-10-CM | POA: Diagnosis not present

## 2021-07-18 DIAGNOSIS — E7849 Other hyperlipidemia: Secondary | ICD-10-CM | POA: Diagnosis not present

## 2021-07-18 DIAGNOSIS — N183 Chronic kidney disease, stage 3 unspecified: Secondary | ICD-10-CM | POA: Diagnosis not present

## 2021-07-18 DIAGNOSIS — I10 Essential (primary) hypertension: Secondary | ICD-10-CM | POA: Diagnosis not present

## 2021-07-18 DIAGNOSIS — Z1331 Encounter for screening for depression: Secondary | ICD-10-CM | POA: Diagnosis not present

## 2021-08-12 ENCOUNTER — Encounter: Payer: Self-pay | Admitting: Surgery

## 2021-08-12 ENCOUNTER — Ambulatory Visit: Payer: Medicare HMO | Admitting: Surgery

## 2021-08-12 VITALS — BP 165/79 | HR 54 | Temp 98.1°F | Resp 14 | Ht 73.0 in | Wt 232.0 lb

## 2021-08-12 DIAGNOSIS — Z01812 Encounter for preprocedural laboratory examination: Secondary | ICD-10-CM | POA: Diagnosis not present

## 2021-08-12 MED ORDER — POLYETHYLENE GLYCOL 3350 17 GM/SCOOP PO POWD: 1.0000 | Freq: Once | ORAL | 0 refills | Status: AC

## 2021-08-12 MED ORDER — BISACODYL 5 MG PO TBEC: 20.0000 mg | DELAYED_RELEASE_TABLET | Freq: Once | ORAL | 0 refills | Status: AC

## 2021-08-12 NOTE — Progress Notes (Signed)
[facility] History and Physical  Reason for Referral:*** Referring Physician: ***  Chief Complaint   Colonoscopy; New Patient (Initial Visit)     Harold Nicholson is a 71 y.o. male.  HPI: ***  Past Medical History:  Diagnosis Date   Arthritis    Complication of anesthesia    pt stated he stopped breathing during surgery   Hiatal hernia    Hypercholesteremia    Hypertension    Hypothyroidism    Impaired fasting glucose    Pure hyperglyceridemia    Thyroid disease    Hypothyroid     Past Surgical History:  Procedure Laterality Date   CHOLECYSTECTOMY     CHOLECYSTECTOMY N/A 05/01/2013   Procedure: LAPAROSCOPIC CHOLECYSTECTOMY;  Surgeon: Jamesetta So, MD;  [facility]: [facility];  Service: General;  Laterality: N/A;   COLONOSCOPY  10/07/2010   Procedure: COLONOSCOPY;  Surgeon: Jamesetta So;  [facility];  Service: Gastroenterology;  Laterality: N/A;   ESOPHAGOGASTRODUODENOSCOPY  10/07/2010   Procedure: ESOPHAGOGASTRODUODENOSCOPY (EGD);  Surgeon: Jamesetta So;  [facility];  Service: Gastroenterology;  Laterality: N/A;   EYE SURGERY     JOINT REPLACEMENT     Right knee   Left Knee Arthroscopy     LIVER BIOPSY N/A 05/01/2013   Procedure: LIVER BIOPSY;  Surgeon: Jamesetta So, MD;  [facility];  Service: General;  Laterality: N/A;   right knee     X 2   TOTAL KNEE ARTHROPLASTY Left 09/17/2014   Procedure: TOTAL KNEE ARTHROPLASTY;  Surgeon: Vickey Huger, MD;  Location: [facility];  Service: Orthopedics;  Laterality: Left;    Family History  Problem Relation Age of Onset   Diabetes Mother    Hyperlipidemia Mother    Hypertension Mother    Hyperlipidemia Father    Hypertension Father    Diabetes Sister    Hearing loss Sister    Hyperlipidemia Sister    Hypertension Sister    Diabetes Brother    Hyperlipidemia Brother    Hypertension Brother     Social History   Tobacco Use   Smoking status: Never   Smokeless tobacco:  Current    Types: Snuff  Substance Use Topics   Alcohol use: No    Comment: quit drinking 3 years ago   Drug use: No    Medications: I have reviewed the patient's current medications. Allergies as of 08/12/2021   No Known Allergies      Medication List        Accurate as of August 12, 2021 10:42 AM. If you have any questions, ask your nurse or doctor.          STOP taking these medications    aspirin EC 81 MG tablet Stopped by: Camielle Sizer A Leibish Mcgregor, DO   hydrochlorothiazide 25 MG tablet Commonly known as: HYDRODIURIL Stopped by: Rylei Masella A Kristiana Jacko, DO       TAKE these medications    bisoprolol-hydrochlorothiazide 10-6.25 MG tablet Commonly known as: ZIAC Take 1 tablet by mouth daily.   cholecalciferol 1000 units tablet Commonly known as: VITAMIN D Take 1,000 Units by mouth daily.   fenofibrate 160 MG tablet Take 160 mg by mouth daily.   levothyroxine 150 MCG tablet Commonly known as: SYNTHROID   loratadine 10 MG tablet Commonly known as: CLARITIN Take 10 mg by mouth daily.   meclizine 25 MG tablet Commonly known as: ANTIVERT Take by mouth.   omeprazole 40 MG capsule Commonly known as: PRILOSEC  Take 40 mg by mouth daily.   potassium gluconate 595 (99 K) MG Tabs tablet Take 1,190 mg by mouth daily.   tadalafil 5 MG tablet Commonly known as: CIALIS Take 5 mg by mouth as needed.         ROS:  Constitutional: negative for chills, fatigue, and fevers Eyes: negative for visual disturbance and pain Ears, nose, mouth, throat, and face: positive for sinus problems, negative for ear drainage and sore throat Respiratory: negative for cough, wheezing, and shortness of breath Cardiovascular: negative for chest pain and palpitations Gastrointestinal: negative for abdominal pain, nausea, reflux symptoms, and vomiting Genitourinary:negative for dysuria and frequency Integument/breast: negative for dryness and rash Hematologic/lymphatic: negative  for bleeding and lymphadenopathy Musculoskeletal:negative for back pain, neck pain, and joint pain Neurological: negative for dizziness, tremors, and numbness Endocrine: negative for temperature intolerance  Blood pressure (!) 165/79, pulse (!) 54, temperature 98.1 F (36.7 C), temperature source Oral, resp. rate 14, height '6\' 1"'$  (1.854 m), weight 232 lb (105.2 kg), SpO2 95 %. Physical Exam Vitals reviewed.  Constitutional:      Appearance: Normal appearance.  HENT:     Head: Normocephalic and atraumatic.     Mouth/Throat:     Mouth: Mucous membranes are dry.  Eyes:     Extraocular Movements: Extraocular movements intact.     Pupils: Pupils are equal, round, and reactive to light.  Cardiovascular:     Rate and Rhythm: Normal rate and regular rhythm.  Pulmonary:     Effort: Pulmonary effort is normal.     Breath sounds: Normal breath sounds.  Abdominal:     Comments: Abdomen soft, nondistended, no percussion tenderness, no tenderness to palpation; No rigidity, guarding, or rebound tenderness  Musculoskeletal:        General: Normal range of motion.     Cervical back: Normal range of motion.  Skin:    General: Skin is warm and dry.  Neurological:     General: No focal deficit present.     Mental Status: He is alert and oriented to person, place, and time.  Psychiatric:        Mood and Affect: Mood normal.        Behavior: Behavior normal.     Results: No results found for this or any previous visit (from the past 48 hour(s)).  No results found.   Assessment & Plan:  Harold Nicholson is a 71 y.o. male with *** -*** -The risk and benefits of *** were discussed including but not limited to ***.  After careful consideration, Harold Nicholson has decided to ***.  -Follow up ***  All questions were answered to the satisfaction of the patient and family***.   Graciella Freer, DO [facility] [address] [place], [place]  [phone] [phone] (office)

## 2021-08-14 NOTE — H&P (Signed)
[facility] History and Physical   Reason for Referral: Colonoscopy Referring Physician: Dr. Hilma Favors   Chief Complaint   Colonoscopy; New Patient (Initial Visit)        Harold Nicholson is a 71 y.o. male.  HPI: Patient presents to discuss and schedule colonoscopy.  He has had 2 previous colonoscopies, with the most recent being 10 years ago.  At that time he did not identify any abnormalities.  His colonoscopy prior to that was 3 years prior, as he had polyps noted.  He denies any abdominal pain, hematochezia, melena, nausea, and vomiting.  He has not no personal or family history of colon cancer.  His past medical history is significant for hypertension, hypothyroidism, hyperlipidemia, and chronic kidney disease.  He has followed with a nephrologist, who said that he will need to monitor his creatinine.  He denies use of blood thinning medications.  He denies use of tobacco products, alcohol, and illicit drugs.  His surgical history is significant for cholecystectomy.       Past Medical History:  Diagnosis Date   Arthritis     Complication of anesthesia      pt stated he stopped breathing during surgery   Hiatal hernia     Hypercholesteremia     Hypertension     Hypothyroidism     Impaired fasting glucose     Pure hyperglyceridemia     Thyroid disease      Hypothyroid            Past Surgical History:  Procedure Laterality Date   CHOLECYSTECTOMY       CHOLECYSTECTOMY N/A 05/01/2013    Procedure: LAPAROSCOPIC CHOLECYSTECTOMY;  Surgeon: Jamesetta So, MD;  [facility];  Service: General;  Laterality: N/A;   COLONOSCOPY   10/07/2010    Procedure: COLONOSCOPY;  Surgeon: Jamesetta So;  [facility];  Service: Gastroenterology;  Laterality: N/A;   ESOPHAGOGASTRODUODENOSCOPY   10/07/2010    Procedure: ESOPHAGOGASTRODUODENOSCOPY (EGD);  Surgeon: Jamesetta So;  [facility];  Service: Gastroenterology;  Laterality: N/A;   EYE SURGERY        JOINT REPLACEMENT        Right knee   Left Knee Arthroscopy       LIVER BIOPSY N/A 05/01/2013    Procedure: LIVER BIOPSY;  Surgeon: Jamesetta So, MD;  [facility];  Service: General;  Laterality: N/A;   right knee        X 2   TOTAL KNEE ARTHROPLASTY Left 09/17/2014    Procedure: TOTAL KNEE ARTHROPLASTY;  Surgeon: Vickey Huger, MD;  Location: [facility];  Service: Orthopedics;  Laterality: Left;           Family History  Problem Relation Age of Onset   Diabetes Mother     Hyperlipidemia Mother     Hypertension Mother     Hyperlipidemia Father     Hypertension Father     Diabetes Sister     Hearing loss Sister     Hyperlipidemia Sister     Hypertension Sister     Diabetes Brother     Hyperlipidemia Brother     Hypertension Brother        Social History         Tobacco Use   Smoking status: Never   Smokeless tobacco: Current      Types: Snuff  Substance Use Topics   Alcohol use: No      Comment: quit  drinking 3 years ago   Drug use: No      Medications: I have reviewed the patient's current medications. Allergies as of 08/12/2021   No Known Allergies         Medication List           Accurate as of August 12, 2021 10:42 AM. If you have any questions, ask your nurse or doctor.              STOP taking these medications     aspirin EC 81 MG tablet Stopped by: Ravinder Hofland A Annell Canty, DO    hydrochlorothiazide 25 MG tablet Commonly known as: HYDRODIURIL Stopped by: Jorden Minchey A Abdulla Pooley, DO           TAKE these medications     bisoprolol-hydrochlorothiazide 10-6.25 MG tablet Commonly known as: ZIAC Take 1 tablet by mouth daily.    cholecalciferol 1000 units tablet Commonly known as: VITAMIN D Take 1,000 Units by mouth daily.    fenofibrate 160 MG tablet Take 160 mg by mouth daily.    levothyroxine 150 MCG tablet Commonly known as: SYNTHROID    loratadine 10 MG tablet Commonly known as: CLARITIN Take 10 mg by mouth daily.    meclizine 25  MG tablet Commonly known as: ANTIVERT Take by mouth.    omeprazole 40 MG capsule Commonly known as: PRILOSEC Take 40 mg by mouth daily.    potassium gluconate 595 (99 K) MG Tabs tablet Take 1,190 mg by mouth daily.    tadalafil 5 MG tablet Commonly known as: CIALIS Take 5 mg by mouth as needed.               ROS:  Constitutional: negative for chills, fatigue, and fevers Eyes: negative for visual disturbance and pain Ears, nose, mouth, throat, and face: positive for sinus problems, negative for ear drainage and sore throat Respiratory: negative for cough, wheezing, and shortness of breath Cardiovascular: negative for chest pain and palpitations Gastrointestinal: negative for abdominal pain, nausea, reflux symptoms, and vomiting Genitourinary:negative for dysuria and frequency Integument/breast: negative for dryness and rash Hematologic/lymphatic: negative for bleeding and lymphadenopathy Musculoskeletal:negative for back pain, neck pain, and joint pain Neurological: negative for dizziness, tremors, and numbness Endocrine: negative for temperature intolerance   Blood pressure (!) 165/79, pulse (!) 54, temperature 98.1 F (36.7 C), temperature source Oral, resp. rate 14, height '6\' 1"'$  (1.854 m), weight 232 lb (105.2 kg), SpO2 95 %. Physical Exam Vitals reviewed.  Constitutional:      Appearance: Normal appearance.  HENT:     Head: Normocephalic and atraumatic.  Eyes:     Extraocular Movements: Extraocular movements intact.     Pupils: Pupils are equal, round, and reactive to light.  Cardiovascular:     Rate and Rhythm: Normal rate and regular rhythm.  Pulmonary:     Effort: Pulmonary effort is normal.     Breath sounds: Normal breath sounds.  Abdominal:     Comments: Abdomen soft, nondistended, no percussion tenderness, no tenderness to palpation; No rigidity, guarding, or rebound tenderness  Musculoskeletal:        General: Normal range of motion.     Cervical back:  Normal range of motion.  Skin:    General: Skin is warm and dry.  Neurological:     General: No focal deficit present.     Mental Status: He is alert and oriented to person, place, and time.  Psychiatric:        Mood and Affect: Mood  normal.        Behavior: Behavior normal.        Results: Lab Results Last 48 Hours  No results found for this or any previous visit (from the past 48 hour(s)).     Imaging Results (Last 48 hours)  No results found.       Assessment & Plan:  Harold Nicholson is a 71 y.o. male who presents for evaluation for colonoscopy.   -Patient's last colonoscopy was in 2013, and he is due for his next colonoscopy -The risk and benefits of colonoscopy were discussed including but not limited to bleeding, infection, missed lesion, and colon perforation requiring surgery.  After careful consideration, Harold Nicholson has decided to proceed with colonoscopy.  -Patient states that he would prefer the liquid prep over the Sutab prep.  Prescriptions provided for MiraLAX bowel prep and Dulcolax -Patient scheduled for colonoscopy on 7/18 -Patient's last creatinine was elevated at 2.09, will obtain BMP on 7/14   All questions were answered to the satisfaction of the patient and family.     Graciella Freer, DO [facility] [address] [place], [place] [address] [phone] (office)

## 2021-08-29 ENCOUNTER — Other Ambulatory Visit (HOSPITAL_COMMUNITY)
Admission: RE | Admit: 2021-08-29 | Discharge: 2021-08-29 | Disposition: A | Discharge status: Home / Self Care | Payer: Medicare HMO | Source: Ambulatory Visit | Attending: Surgery | Admitting: Surgery

## 2021-08-29 DIAGNOSIS — J302 Other seasonal allergic rhinitis: Secondary | ICD-10-CM | POA: Diagnosis not present

## 2021-08-29 LAB — BASIC METABOLIC PANEL
Anion gap: 6 (ref 5–15)
BUN: 25 mg/dL — ABNORMAL HIGH (ref 8–23)
CO2: 24 mmol/L (ref 22–32)
Calcium: 9.5 mg/dL (ref 8.9–10.3)
Chloride: 107 mmol/L (ref 98–111)
Creatinine, Ser: 2.13 mg/dL — ABNORMAL HIGH (ref 0.61–1.24)
GFR, Estimated: 33 mL/min — ABNORMAL LOW (ref >60)
Glucose, Bld: 99 mg/dL (ref 70–99)
Potassium: 4.6 mmol/L (ref 3.5–5.1)
Sodium: 137 mmol/L (ref 135–145)

## 2021-09-02 ENCOUNTER — Encounter (HOSPITAL_COMMUNITY)
Admission: RE | Disposition: A | Discharge status: Home / Self Care | Payer: Self-pay | Source: Home / Self Care | Attending: Surgery

## 2021-09-02 ENCOUNTER — Ambulatory Visit (HOSPITAL_BASED_OUTPATIENT_CLINIC_OR_DEPARTMENT_OTHER): Payer: Medicare HMO | Admitting: Anesthesiology

## 2021-09-02 ENCOUNTER — Ambulatory Visit (HOSPITAL_COMMUNITY): Payer: Medicare HMO | Admitting: Anesthesiology

## 2021-09-02 ENCOUNTER — Encounter (HOSPITAL_COMMUNITY): Payer: Self-pay | Admitting: Surgery

## 2021-09-02 ENCOUNTER — Other Ambulatory Visit: Payer: Self-pay

## 2021-09-02 ENCOUNTER — Ambulatory Visit (HOSPITAL_COMMUNITY)
Admission: RE | Admit: 2021-09-02 | Discharge: 2021-09-02 | Disposition: A | Discharge status: Home / Self Care | Payer: Medicare HMO | Attending: Surgery | Admitting: Surgery

## 2021-09-02 DIAGNOSIS — I10 Essential (primary) hypertension: Secondary | ICD-10-CM

## 2021-09-02 DIAGNOSIS — M25562 Pain in left knee: Secondary | ICD-10-CM

## 2021-09-02 DIAGNOSIS — G709 Myoneural disorder, unspecified: Secondary | ICD-10-CM | POA: Diagnosis not present

## 2021-09-02 DIAGNOSIS — K640 First degree hemorrhoids: Secondary | ICD-10-CM

## 2021-09-02 DIAGNOSIS — E785 Hyperlipidemia, unspecified: Secondary | ICD-10-CM | POA: Insufficient documentation

## 2021-09-02 DIAGNOSIS — J302 Other seasonal allergic rhinitis: Secondary | ICD-10-CM

## 2021-09-02 DIAGNOSIS — Z96659 Presence of unspecified artificial knee joint: Secondary | ICD-10-CM

## 2021-09-02 DIAGNOSIS — K649 Unspecified hemorrhoids: Secondary | ICD-10-CM | POA: Insufficient documentation

## 2021-09-02 DIAGNOSIS — M75121 Complete rotator cuff tear or rupture of right shoulder, not specified as traumatic: Secondary | ICD-10-CM

## 2021-09-02 DIAGNOSIS — Z1211 Encounter for screening for malignant neoplasm of colon: Secondary | ICD-10-CM | POA: Insufficient documentation

## 2021-09-02 DIAGNOSIS — Z860100 Personal history of colon polyps, unspecified: Secondary | ICD-10-CM

## 2021-09-02 DIAGNOSIS — Z9049 Acquired absence of other specified parts of digestive tract: Secondary | ICD-10-CM | POA: Insufficient documentation

## 2021-09-02 DIAGNOSIS — K219 Gastro-esophageal reflux disease without esophagitis: Secondary | ICD-10-CM

## 2021-09-02 DIAGNOSIS — E039 Hypothyroidism, unspecified: Secondary | ICD-10-CM | POA: Diagnosis not present

## 2021-09-02 DIAGNOSIS — M1712 Unilateral primary osteoarthritis, left knee: Secondary | ICD-10-CM

## 2021-09-02 DIAGNOSIS — G8929 Other chronic pain: Secondary | ICD-10-CM

## 2021-09-02 DIAGNOSIS — N189 Chronic kidney disease, unspecified: Secondary | ICD-10-CM | POA: Diagnosis not present

## 2021-09-02 DIAGNOSIS — M79606 Pain in leg, unspecified: Secondary | ICD-10-CM

## 2021-09-02 DIAGNOSIS — I129 Hypertensive chronic kidney disease with stage 1 through stage 4 chronic kidney disease, or unspecified chronic kidney disease: Secondary | ICD-10-CM | POA: Diagnosis not present

## 2021-09-02 HISTORY — PX: COLONOSCOPY WITH PROPOFOL: SHX5780

## 2021-09-02 SURGERY — COLONOSCOPY WITH PROPOFOL
Anesthesia: General

## 2021-09-02 MED ORDER — LIDOCAINE HCL (CARDIAC) PF 100 MG/5ML IV SOSY
PREFILLED_SYRINGE | INTRAVENOUS | Status: DC | PRN
  Administered 2021-09-02: 50 mg via INTRAVENOUS

## 2021-09-02 MED ORDER — PROPOFOL 10 MG/ML IV BOLUS
INTRAVENOUS | Status: DC | PRN
  Administered 2021-09-02 (×2): 50 mg via INTRAVENOUS
  Administered 2021-09-02: 100 mg via INTRAVENOUS
  Administered 2021-09-02: 50 mg via INTRAVENOUS

## 2021-09-02 MED ORDER — LACTATED RINGERS IV SOLN: INTRAVENOUS | Status: DC

## 2021-09-02 NOTE — Progress Notes (Signed)
Update note:  Spoke with the patient in PACU.  I explained that I did not find any abnormalities in his colon, but his prep was not the greatest.  I expressed that we might need to try a different prep versus a 2-day prep for his next colonoscopy.  He should have a repeat in 10 years.  All questions were answered to his expressed satisfaction.  Graciella Freer, DO [facility] [address] [place], [place] [address] [phone] (office)

## 2021-09-02 NOTE — Op Note (Signed)
[facility] Patient Name: Harold Nicholson Procedure Date: 09/02/2021 7:10 AM MRN: [id number] Date of Birth: May 10, 1950 Attending MD: Flint Melter Leland Raver DO,  CSN: [id number] Age: 71 Admit Type: Outpatient Procedure:                Colonoscopy Indications:              Personal history of colonic polyps Providers:                Barnetta Chapel A. Anu Stagner DO, Caprice Kluver, Casimer Bilis, Technician Referring MD:              Medicines:                Monitored Anesthesia Care Complications:            No immediate complications. Estimated Blood Loss:     Estimated blood loss: none. Procedure:                Pre-Anesthesia Assessment:                           - Pre-procedure physical examination revealed no                            contraindications to sedation.                           - The anesthesia plan was to use monitored                            anesthesia care (MAC).                           After obtaining informed consent, the colonoscope                            was passed under direct vision. Throughout the                            procedure, the patient's blood pressure, pulse, and                            oxygen saturations were monitored continuously. The                            [id number]-HQ190L ([id number]) scope was introduced through                            the anus and advanced to the the cecum, identified                            by appendiceal orifice and ileocecal valve. The                            colonoscopy was performed without difficulty. The  patient tolerated the procedure well. The quality                            of the bowel preparation was evaluated using the                            BBPS [facility] Bowel Preparation Scale) with scores                            of: Right Colon = 1 (portion of mucosa seen, but                            other areas not well seen due to  staining, residual                            stool and/or opaque liquid), Transverse Colon = 2                            (minor amount of residual staining, small fragments                            of stool and/or opaque liquid, but mucosa seen                            well) and Left Colon = 2 (minor amount of residual                            staining, small fragments of stool and/or opaque                            liquid, but mucosa seen well). The total BBPS score                            equals 5. Suboptimal prep. Scope insertion time was                            10 minutes. Scope withdrawal time was 10 minutes.                            The total duration of the procedure was 20 minutes. Scope In: 7:35:05 AM Scope Out: 7:56:02 AM Scope Withdrawal Time: 0 hours 10 minutes 46 seconds  Total Procedure Duration: 0 hours 20 minutes 57 seconds  Findings:      The entire examined colon appeared normal on direct and retroflexion       views. Impression:               - The entire examined colon is normal on direct and                            retroflexion views.                           - No  specimens collected.                           - Hemorrhoids. Moderate Sedation:      An independent trained observer was present and continuously monitored       the patient. Recommendation:           - Discharge patient to home (ambulatory).                           - Resume regular diet today.                           - Continue present medications.                           - Repeat colonoscopy in 10 years for screening                            purposes.                           - May require 2 day prep prior to next colonoscopy Procedure Code(s):        --- Professional ---                           [phone], Colonoscopy, flexible; diagnostic, including                            collection of specimen(s) by brushing or washing,                            when performed  (separate procedure) Diagnosis Code(s):        --- Professional ---                           Z86.010, Personal history of colonic polyps                           K64.9, Unspecified hemorrhoids CPT copyright 2019 American Medical Association. All rights reserved. The codes documented in this report are preliminary and upon coder review may  be revised to meet current compliance requirements. Barnetta Chapel A Eun Vermeer DO,  09/02/2021 8:07:04 AM Number of Addenda: 0

## 2021-09-02 NOTE — Interval H&P Note (Signed)
History and Physical Interval Note:  09/02/2021 7:24 AM  [place]  has presented today for surgery, with the diagnosis of HX COLON POLYPS.  The various methods of treatment have been discussed with the patient and family. After consideration of risks, benefits and other options for treatment, the patient has consented to  Procedure(s): COLONOSCOPY WITH PROPOFOL (N/A) as a surgical intervention.  The patient's history has been reviewed, patient examined, no change in status, stable for surgery.  I have reviewed the patient's chart and labs.  Questions were answered to the patient's satisfaction.     Baker

## 2021-09-02 NOTE — Anesthesia Postprocedure Evaluation (Signed)
Anesthesia Post Note  Patient: Harold Nicholson  Procedure(s) Performed: COLONOSCOPY WITH PROPOFOL  Patient location during evaluation: Phase II Anesthesia Type: General Level of consciousness: awake and alert and oriented Pain management: pain level controlled Vital Signs Assessment: post-procedure vital signs reviewed and stable Respiratory status: spontaneous breathing, nonlabored ventilation and respiratory function stable Cardiovascular status: blood pressure returned to baseline and stable Postop Assessment: no apparent nausea or vomiting Anesthetic complications: no   No notable events documented.   Last Vitals:  Vitals:   09/02/21 0656 09/02/21 0759  BP: (!) 162/72 115/74  Pulse: (!) 49   Resp: 19 18  Temp: 36.9 C 36.4 C  SpO2: 98% 98%    Last Pain:  Vitals:   09/02/21 0759  TempSrc: Oral  PainSc: 0-No pain                 Ayce Pietrzyk C Kinsley Holderman

## 2021-09-02 NOTE — Anesthesia Preprocedure Evaluation (Signed)
Anesthesia Evaluation  Patient identified by MRN, date of birth, ID band Patient awake    Reviewed: Allergy & Precautions, NPO status , Patient's Chart, lab work & pertinent test results  Airway Mallampati: II  TM Distance: >3 FB Neck ROM: Full    Dental  (+) Missing, Poor Dentition, Chipped, Loose,    Pulmonary neg pulmonary ROS,    Pulmonary exam normal breath sounds clear to auscultation       Cardiovascular Exercise Tolerance: Good hypertension, Pt. on medications Normal cardiovascular exam Rhythm:Regular Rate:Bradycardia     Neuro/Psych  Neuromuscular disease negative psych ROS   GI/Hepatic Neg liver ROS, hiatal hernia, GERD  Medicated and Controlled,  Endo/Other  Hypothyroidism   Renal/GU Renal disease  negative genitourinary   Musculoskeletal  (+) Arthritis ,   Abdominal   Peds negative pediatric ROS (+)  Hematology negative hematology ROS (+)   Anesthesia Other Findings   Reproductive/Obstetrics negative OB ROS                             Anesthesia Physical Anesthesia Plan  ASA: 2  Anesthesia Plan: General   Post-op Pain Management: Minimal or no pain anticipated   Induction: Intravenous  PONV Risk Score and Plan: Propofol infusion  Airway Management Planned: Nasal Cannula and Natural Airway  Additional Equipment:   Intra-op Plan:   Post-operative Plan:   Informed Consent: I have reviewed the patients History and Physical, chart, labs and discussed the procedure including the risks, benefits and alternatives for the proposed anesthesia with the patient or authorized representative who has indicated his/her understanding and acceptance.     Dental advisory given  Plan Discussed with: CRNA and Surgeon  Anesthesia Plan Comments:         Anesthesia Quick Evaluation

## 2021-09-02 NOTE — Transfer of Care (Signed)
Immediate Anesthesia Transfer of Care Note  Patient: Harold Nicholson  Procedure(s) Performed: COLONOSCOPY WITH PROPOFOL  Patient Location: Endoscopy Unit  Anesthesia Type:General  Level of Consciousness: awake and oriented  Airway & Oxygen Therapy: Patient Spontanous Breathing  Post-op Assessment: Report given to RN and Post -op Vital signs reviewed and stable  Post vital signs: Reviewed and stable  Last Vitals:  Vitals Value Taken Time  BP    Temp    Pulse    Resp    SpO2      Last Pain:  Vitals:   09/02/21 0731  TempSrc:   PainSc: 0-No pain      Patients Stated Pain Goal: 7 ([id number])  Complications: No notable events documented.

## 2021-09-02 NOTE — Anesthesia Procedure Notes (Signed)
Date/Time: 09/02/2021 7:36 AM  Performed by: Orlie Dakin, CRNAPre-anesthesia Checklist: Patient identified, Emergency Drugs available, Suction available and Patient being monitored Patient Re-evaluated:Patient Re-evaluated prior to induction Oxygen Delivery Method: Nasal cannula Induction Type: IV induction Placement Confirmation: positive ETCO2

## 2021-09-08 ENCOUNTER — Encounter (HOSPITAL_COMMUNITY): Payer: Self-pay | Admitting: Surgery

## 2021-11-10 ENCOUNTER — Ambulatory Visit (INDEPENDENT_AMBULATORY_CARE_PROVIDER_SITE_OTHER): Payer: Medicare HMO

## 2021-11-10 ENCOUNTER — Encounter: Payer: Self-pay | Admitting: Orthopedic Surgery

## 2021-11-10 ENCOUNTER — Ambulatory Visit: Payer: Medicare HMO | Admitting: Orthopedic Surgery

## 2021-11-10 DIAGNOSIS — M19079 Primary osteoarthritis, unspecified ankle and foot: Secondary | ICD-10-CM

## 2021-11-10 DIAGNOSIS — M19071 Primary osteoarthritis, right ankle and foot: Secondary | ICD-10-CM | POA: Diagnosis not present

## 2021-11-10 NOTE — Progress Notes (Signed)
Chief Complaint  Patient presents with   ankle arthritis    RT ankle 1 yr follow up w/ xrays   1 year follow-up for [place] he has osteoarthritis of his right ankle he was seen by foot and ankle specialist he is wearing a brace which he wears as needed.  He seems to wear it less during the summer when it is hot and then more in the wintertime  He has pain when he is in maximum plantarflexion with posterior and anterior pain  His exam shows he has maintained good range of motion his x-ray shows minimal change in his joint space noted on the lateral film  He will continue bracing as needed and follow-up in a year for x-ray

## 2021-12-15 DIAGNOSIS — Z6832 Body mass index (BMI) 32.0-32.9, adult: Secondary | ICD-10-CM | POA: Diagnosis not present

## 2021-12-15 DIAGNOSIS — E6609 Other obesity due to excess calories: Secondary | ICD-10-CM | POA: Diagnosis not present

## 2021-12-15 DIAGNOSIS — R1030 Lower abdominal pain, unspecified: Secondary | ICD-10-CM | POA: Diagnosis not present

## 2021-12-15 DIAGNOSIS — R319 Hematuria, unspecified: Secondary | ICD-10-CM | POA: Diagnosis not present

## 2021-12-23 ENCOUNTER — Other Ambulatory Visit (HOSPITAL_COMMUNITY): Payer: Self-pay | Admitting: Family Medicine

## 2021-12-23 DIAGNOSIS — R1030 Lower abdominal pain, unspecified: Secondary | ICD-10-CM

## 2021-12-23 DIAGNOSIS — R319 Hematuria, unspecified: Secondary | ICD-10-CM

## 2021-12-26 DIAGNOSIS — N183 Chronic kidney disease, stage 3 unspecified: Secondary | ICD-10-CM | POA: Diagnosis not present

## 2021-12-26 DIAGNOSIS — R1030 Lower abdominal pain, unspecified: Secondary | ICD-10-CM | POA: Diagnosis not present

## 2021-12-26 DIAGNOSIS — E6609 Other obesity due to excess calories: Secondary | ICD-10-CM | POA: Diagnosis not present

## 2021-12-26 DIAGNOSIS — Z6831 Body mass index (BMI) 31.0-31.9, adult: Secondary | ICD-10-CM | POA: Diagnosis not present

## 2021-12-26 DIAGNOSIS — R972 Elevated prostate specific antigen [PSA]: Secondary | ICD-10-CM | POA: Diagnosis not present

## 2021-12-26 DIAGNOSIS — R319 Hematuria, unspecified: Secondary | ICD-10-CM | POA: Diagnosis not present

## 2021-12-30 ENCOUNTER — Encounter (INDEPENDENT_AMBULATORY_CARE_PROVIDER_SITE_OTHER): Payer: Self-pay | Admitting: *Deleted

## 2022-01-01 ENCOUNTER — Ambulatory Visit (HOSPITAL_COMMUNITY)
Admission: RE | Admit: 2022-01-01 | Discharge: 2022-01-01 | Disposition: A | Discharge status: Home / Self Care | Payer: Medicare HMO | Source: Ambulatory Visit | Attending: Family Medicine | Admitting: Family Medicine

## 2022-01-01 DIAGNOSIS — R319 Hematuria, unspecified: Secondary | ICD-10-CM | POA: Insufficient documentation

## 2022-01-01 DIAGNOSIS — R1030 Lower abdominal pain, unspecified: Secondary | ICD-10-CM | POA: Diagnosis not present

## 2022-01-01 DIAGNOSIS — N281 Cyst of kidney, acquired: Secondary | ICD-10-CM | POA: Diagnosis not present

## 2022-01-01 LAB — POCT I-STAT CREATININE: Creatinine, Ser: 2.3 mg/dL — ABNORMAL HIGH (ref 0.61–1.24)

## 2022-01-01 MED ORDER — IOHEXOL 300 MG/ML  SOLN
100.0000 mL | Freq: Once | INTRAMUSCULAR | Status: AC | PRN
  Administered 2022-01-01: 80 mL via INTRAVENOUS

## 2022-01-15 ENCOUNTER — Ambulatory Visit (INDEPENDENT_AMBULATORY_CARE_PROVIDER_SITE_OTHER): Payer: Medicare HMO | Admitting: Urology

## 2022-01-15 ENCOUNTER — Encounter: Payer: Self-pay | Admitting: Urology

## 2022-01-15 VITALS — BP 183/81 | HR 54

## 2022-01-15 DIAGNOSIS — R3129 Other microscopic hematuria: Secondary | ICD-10-CM | POA: Diagnosis not present

## 2022-01-15 DIAGNOSIS — R351 Nocturia: Secondary | ICD-10-CM | POA: Diagnosis not present

## 2022-01-15 DIAGNOSIS — R350 Frequency of micturition: Secondary | ICD-10-CM

## 2022-01-15 DIAGNOSIS — N401 Enlarged prostate with lower urinary tract symptoms: Secondary | ICD-10-CM | POA: Diagnosis not present

## 2022-01-15 DIAGNOSIS — N138 Other obstructive and reflux uropathy: Secondary | ICD-10-CM

## 2022-01-15 DIAGNOSIS — N1832 Chronic kidney disease, stage 3b: Secondary | ICD-10-CM | POA: Diagnosis not present

## 2022-01-15 NOTE — Progress Notes (Signed)
Subjective: 1. Microhematuria   2. BPH with urinary obstruction   3. Nocturia   4. Urinary frequency      Consult requested by Dr. Sharilyn Sites   Harold Nicholson is a 71 yo male who is sent for microhematuria with large blood on a dip UA.  I don't see a micro.  He had a culture but the results are not in his notes.  He had some anorexia with 20lb weight loss with mid abdominal pain and had a CT AP with contrast that showed a small left renal cyst but no other significant GU findings.  He has had no gross hematuria and his UA today had 3+ blood with 11-30 RBC's and 2+ protein.  He has ED and takes tadalafil with some effect.  He has no other GU history.  His Cr is 2.3 in 01/01/22. His PSA with Dr. Hilma Favors was 3.3. on recent labs.  ROS:  Review of Systems  Genitourinary:  Positive for frequency.  Endo/Heme/Allergies:  Positive for polydipsia. Bruises/bleeds easily.  All other systems reviewed and are negative.   No Known Allergies  Past Medical History:  Diagnosis Date   Arthritis    Complication of anesthesia    pt stated he stopped breathing during surgery   Hiatal hernia    Hypercholesteremia    Hypertension    Hypothyroidism    Impaired fasting glucose    Pure hyperglyceridemia    Thyroid disease    Hypothyroid     Past Surgical History:  Procedure Laterality Date   CHOLECYSTECTOMY     CHOLECYSTECTOMY N/A 05/01/2013   Procedure: LAPAROSCOPIC CHOLECYSTECTOMY;  Surgeon: Jamesetta So, MD;  Location: [facility];  Service: General;  Laterality: N/A;   COLONOSCOPY  10/07/2010   Procedure: COLONOSCOPY;  Surgeon: Jamesetta So;  Location: [facility];  Service: Gastroenterology;  Laterality: N/A;   COLONOSCOPY WITH PROPOFOL N/A 09/02/2021   Procedure: COLONOSCOPY WITH PROPOFOL;  Surgeon: Rusty Aus, DO;  Location: [facility];  Service: General;  Laterality: N/A;   ESOPHAGOGASTRODUODENOSCOPY  10/07/2010   Procedure: ESOPHAGOGASTRODUODENOSCOPY (EGD);  Surgeon: Jamesetta So;  Location: [facility];  Service: Gastroenterology;  Laterality: N/A;   EYE SURGERY     JOINT REPLACEMENT     Right knee   Left Knee Arthroscopy     LIVER BIOPSY N/A 05/01/2013   Procedure: LIVER BIOPSY;  Surgeon: Jamesetta So, MD;  Location: [facility];  Service: General;  Laterality: N/A;   right knee     X 2   TOTAL KNEE ARTHROPLASTY Left 09/17/2014   Procedure: TOTAL KNEE ARTHROPLASTY;  Surgeon: Vickey Huger, MD;  Location: [facility];  Service: Orthopedics;  Laterality: Left;    Social History   Socioeconomic History   Marital status: Married    Spouse name: Not on file   Number of children: Not on file   Years of education: Not on file   Highest education level: Not on file  Occupational History   Not on file  Tobacco Use   Smoking status: Never   Smokeless tobacco: Current    Types: Snuff  Vaping Use   Vaping Use: Never used  Substance and Sexual Activity   Alcohol use: No    Comment: quit drinking 3 years ago   Drug use: No   Sexual activity: Yes    Birth control/protection: None  Other Topics Concern   Not on file  Social History Narrative   Not on file   Social  Determinants of Health   Financial Resource Strain: Not on file  Food Insecurity: Not on file  Transportation Needs: Not on file  Physical Activity: Not on file  Stress: Not on file  Social Connections: Not on file  Intimate Partner Violence: Not on file    Family History  Problem Relation Age of Onset   Diabetes Mother    Hyperlipidemia Mother    Hypertension Mother    Hyperlipidemia Father    Hypertension Father    Diabetes Sister    Hearing loss Sister    Hyperlipidemia Sister    Hypertension Sister    Diabetes Brother    Hyperlipidemia Brother    Hypertension Brother     Anti-infectives: Anti-infectives (From admission, onward)    None       Current Outpatient Medications  Medication Sig Dispense Refill   acetaminophen (TYLENOL) 650 MG CR tablet Take 650 mg by mouth at  bedtime.     bisoprolol-hydrochlorothiazide (ZIAC) 10-6.25 MG per tablet Take 1 tablet by mouth in the morning.     Cholecalciferol (VITAMIN D) 50 MCG (2000 UT) tablet Take 2,000 Units by mouth in the morning.     fenofibrate 160 MG tablet Take 160 mg by mouth in the morning.     levothyroxine (SYNTHROID) 150 MCG tablet Take 150 mcg by mouth daily before breakfast.     loratadine (CLARITIN) 10 MG tablet Take 10 mg by mouth in the morning.     meclizine (ANTIVERT) 25 MG tablet Take 25 mg by mouth 3 (three) times daily as needed for dizziness.     omeprazole (PRILOSEC) 40 MG capsule Take 40 mg by mouth daily before breakfast.     potassium gluconate 595 (99 K) MG TABS tablet Take 595 mg by mouth in the morning.     tadalafil (CIALIS) 5 MG tablet Take 5 mg by mouth every Saturday. In the morning     No current facility-administered medications for this visit.     Objective: Vital signs in last 24 hours: BP (!) 183/81   Pulse (!) 54   Intake/Output from previous day: No intake/output data recorded. Intake/Output this shift: '@IOTHISSHIFT'$ @   Physical Exam Vitals reviewed.  Constitutional:      Appearance: Normal appearance. He is obese.  HENT:     Head: Normocephalic and atraumatic.  Cardiovascular:     Rate and Rhythm: Regular rhythm. Bradycardia present.     Heart sounds: Normal heart sounds.  Pulmonary:     Effort: Pulmonary effort is normal.     Breath sounds: Normal breath sounds.  Abdominal:     Palpations: Abdomen is soft. There is no mass.     Tenderness: There is no abdominal tenderness.  Genitourinary:    Comments: Normal circ phallus with adequate meatus. Scrotum and contents normal. AP without lesions. NST without mass. Prostate 2+ smooth and firm. SV non-palpable.  Musculoskeletal:        General: No swelling or tenderness. Normal range of motion.     Cervical back: Normal range of motion and neck supple.  Skin:    General: Skin is warm and dry.   Neurological:     General: No focal deficit present.     Mental Status: He is alert and oriented to person, place, and time.  Psychiatric:        Mood and Affect: Mood normal.        Behavior: Behavior normal.     Lab Results:  Results for orders placed  or performed in visit on 01/15/22 (from the past 24 hour(s))  Urinalysis, Routine w reflex microscopic     Status: Abnormal   Collection Time: 01/15/22  1:00 PM  Result Value Ref Range   Specific Gravity, UA 1.020 1.005 - 1.030   pH, UA 5.5 5.0 - 7.5   Color, UA Yellow Yellow   Appearance Ur Clear Clear   Leukocytes,UA Negative Negative   Protein,UA 2+ (A) Negative/Trace   Glucose, UA Negative Negative   Ketones, UA Negative Negative   RBC, UA 3+ (A) Negative   Bilirubin, UA Negative Negative   Urobilinogen, Ur 0.2 0.2 - 1.0 mg/dL   Nitrite, UA Negative Negative   Microscopic Examination See below:    Narrative   Performed at:  [facility] [address] [place], [place]  [id number] Lab Director: Mina Marble [place], Phone:  [phone]  Microscopic Examination     Status: Abnormal   Collection Time: 01/15/22  1:00 PM   Urine  Result Value Ref Range   WBC, UA 0-5 0 - 5 /hpf   RBC, Urine 11-30 (A) 0 - 2 /hpf   Epithelial Cells (non renal) None seen 0 - 10 /hpf   Bacteria, UA None seen None seen/Few   Narrative   Performed at:  [facility], [place], [place]  [id number] Lab Director: [place], Phone:  [phone]    BMET No results for input(s): "NA", "K", "CL", "CO2", "GLUCOSE", "BUN", "CREATININE", "CALCIUM" in the last 72 hours. PT/INR No results for input(s): "LABPROT", "INR" in the last 72 hours. ABG No results for input(s): "PHART", "HCO3" in the last 72 hours.  Invalid input(s): "PCO2", "PO2" Outside records and labs reviewed.    Cr in EPIC reviewed.  UA has 3+ blood but only 0-2 RBC's on micro.  Studies/Results: No results found. CT ABDOMEN PELVIS W  CONTRAST  Result Date: 01/02/2022 CLINICAL DATA:  Lower abdominal pain and diarrhea. EXAM: CT ABDOMEN AND PELVIS WITH CONTRAST TECHNIQUE: Multidetector CT imaging of the abdomen and pelvis was performed using the standard protocol following bolus administration of intravenous contrast. RADIATION DOSE REDUCTION: This exam was performed according to the departmental dose-optimization program which includes automated exposure control, adjustment of the mA and/or kV according to patient size and/or use of iterative reconstruction technique. CONTRAST:  32m OMNIPAQUE IOHEXOL 300 MG/ML  SOLN COMPARISON:  CT abdomen pelvis dated 04/18/2013. FINDINGS: Lower chest: Bibasilar subpleural atelectasis. The visualized lung bases are otherwise clear. There is coronary vascular calcification. No intra-abdominal free air or free fluid. Hepatobiliary: Cirrhosis. A 1 cm hypodense focus in the right lobe of the liver (15/2) appears similar to prior CT, likely a cyst. There is mild biliary ductal dilatation post cholecystectomy. No retained calcified stone noted in the central CBD. Pancreas: Unremarkable. No pancreatic ductal dilatation or surrounding inflammatory changes. Spleen: Normal in size without focal abnormality. Adrenals/Urinary Tract: The adrenal glands are unremarkable. There is no hydronephrosis on either side. There is symmetric enhancement and excretion of contrast by both kidneys. A 1 cm left renal interpolar cyst. No follow-up. The visualized ureters and urinary bladder appear unremarkable. Stomach/Bowel: Scattered sigmoid diverticula without active inflammatory changes. There is moderate stool throughout the colon. There is no bowel obstruction or active inflammation. The appendix is normal. Vascular/Lymphatic: Mild aortoiliac atherosclerotic disease. The IVC is unremarkable. No portal venous gas. There is no adenopathy. Reproductive: The prostate and seminal vesicles are grossly unremarkable. No pelvic mass. Other:  None  Musculoskeletal: Osteopenia with degenerative changes and scoliosis of the lumbar spine. No acute osseous pathology. IMPRESSION: 1. No acute intra-abdominal or pelvic pathology. 2. Cirrhosis. 3. Sigmoid diverticulosis. No bowel obstruction. Normal appendix. 4.  Aortic Atherosclerosis (ICD10-I70.0). Electronically Signed   By: Anner Crete M.D.   On: 01/02/2022 02:52   DG Ankle Complete Right  Result Date: 11/11/2021 Ankle imaging right Ankle arthritis, ankle pain Ankle mortise remains grossly intact with narrowing of the superolateral corner Bone spurs are seen around the ankle joint Compared to x-ray done last year no major change or narrowing Arthritis right ankle joint     Assessment/Plan: Microhematuria.  He has a negative CT.  I will  have him return for cystoscopy.  Procedure and risks reviewed.    BPH with BOO.  He has moderate LUTS with minimal bother.  CKD3b.  He has progressive CKD and may need to return to see Dr. Joelyn Oms who he has seen previously but I will defer that to Dr. Hilma Favors.   No orders of the defined types were placed in this encounter.    Orders Placed This Encounter  Procedures   Microscopic Examination   Urinalysis, Routine w reflex microscopic     Return in about 3 weeks (around 02/05/2022) for cystoscopy .    CC: Dr. Sharilyn Sites.      Irine Seal 01/16/2022

## 2022-01-16 LAB — MICROSCOPIC EXAMINATION
Bacteria, UA: NONE SEEN
Epithelial Cells (non renal): NONE SEEN /hpf (ref 0–10)

## 2022-01-16 LAB — URINALYSIS, ROUTINE W REFLEX MICROSCOPIC
Bilirubin, UA: NEGATIVE
Glucose, UA: NEGATIVE
Ketones, UA: NEGATIVE
Leukocytes,UA: NEGATIVE
Nitrite, UA: NEGATIVE
Specific Gravity, UA: 1.02 (ref 1.005–1.030)
Urobilinogen, Ur: 0.2 mg/dL (ref 0.2–1.0)
pH, UA: 5.5 (ref 5.0–7.5)

## 2022-01-20 DIAGNOSIS — H353131 Nonexudative age-related macular degeneration, bilateral, early dry stage: Secondary | ICD-10-CM | POA: Diagnosis not present

## 2022-02-12 ENCOUNTER — Other Ambulatory Visit: Payer: Medicare HMO | Admitting: Urology

## 2022-03-05 ENCOUNTER — Ambulatory Visit: Payer: Medicare HMO | Admitting: Urology

## 2022-03-05 ENCOUNTER — Encounter: Payer: Self-pay | Admitting: Urology

## 2022-03-05 VITALS — BP 191/76 | HR 54

## 2022-03-05 DIAGNOSIS — N183 Chronic kidney disease, stage 3 unspecified: Secondary | ICD-10-CM

## 2022-03-05 DIAGNOSIS — R351 Nocturia: Secondary | ICD-10-CM

## 2022-03-05 DIAGNOSIS — N401 Enlarged prostate with lower urinary tract symptoms: Secondary | ICD-10-CM | POA: Diagnosis not present

## 2022-03-05 DIAGNOSIS — R3129 Other microscopic hematuria: Secondary | ICD-10-CM

## 2022-03-05 DIAGNOSIS — N138 Other obstructive and reflux uropathy: Secondary | ICD-10-CM | POA: Diagnosis not present

## 2022-03-05 LAB — MICROSCOPIC EXAMINATION: Bacteria, UA: NONE SEEN

## 2022-03-05 LAB — URINALYSIS, ROUTINE W REFLEX MICROSCOPIC
Bilirubin, UA: NEGATIVE
Glucose, UA: NEGATIVE
Ketones, UA: NEGATIVE
Leukocytes,UA: NEGATIVE
Nitrite, UA: NEGATIVE
Specific Gravity, UA: 1.015 (ref 1.005–1.030)
Urobilinogen, Ur: 0.2 mg/dL (ref 0.2–1.0)
pH, UA: 5.5 (ref 5.0–7.5)

## 2022-03-05 MED ORDER — CIPROFLOXACIN HCL 500 MG PO TABS
500.0000 mg | ORAL_TABLET | Freq: Once | ORAL | Status: AC
  Administered 2022-03-05: 500 mg via ORAL

## 2022-03-05 NOTE — Progress Notes (Signed)
Subjective: 1. Microhematuria   2. BPH with urinary obstruction   3. Nocturia      03/05/22: [name] returns today for cystoscopy to complete his hematuria w/u.  His UA has 3-10 RBC's today.  He has no new complaints.    Harold Nicholson is a 72 yo male who is sent for microhematuria with large blood on a dip UA.  I don't see a micro.  He had a culture but the results are not in his notes.  He had some anorexia with 20lb weight loss with mid abdominal pain and had a CT [facility] with contrast that showed a small left renal cyst but no other significant GU findings.  He has had no gross hematuria and his UA today had 3+ blood with 11-30 RBC's and 2+ protein.  He has ED and takes tadalafil with some effect.  He has no other GU history.  His Cr is 2.3 in 01/01/22. His PSA with Dr. Hilma Favors was 3.3. on recent labs.  ROS:  ROS  No Known Allergies  Past Medical History:  Diagnosis Date   Arthritis    Complication of anesthesia    pt stated he stopped breathing during surgery   Hiatal hernia    Hypercholesteremia    Hypertension    Hypothyroidism    Impaired fasting glucose    Pure hyperglyceridemia    Thyroid disease    Hypothyroid     Past Surgical History:  Procedure Laterality Date   CHOLECYSTECTOMY     CHOLECYSTECTOMY N/A 05/01/2013   Procedure: LAPAROSCOPIC CHOLECYSTECTOMY;  Surgeon: Jamesetta So, MD;  Location: [facility];  Service: General;  Laterality: N/A;   COLONOSCOPY  10/07/2010   Procedure: COLONOSCOPY;  Surgeon: Jamesetta So;  [facility];  Service: Gastroenterology;  Laterality: N/A;   COLONOSCOPY WITH PROPOFOL N/A 09/02/2021   Procedure: COLONOSCOPY WITH PROPOFOL;  Surgeon: Rusty Aus, DO;  [facility];  Service: General;  Laterality: N/A;   ESOPHAGOGASTRODUODENOSCOPY  10/07/2010   Procedure: ESOPHAGOGASTRODUODENOSCOPY (EGD);  Surgeon: Jamesetta So;  [facility];  Service: Gastroenterology;  Laterality: N/A;   EYE SURGERY     JOINT  REPLACEMENT     Right knee   Left Knee Arthroscopy     LIVER BIOPSY N/A 05/01/2013   Procedure: LIVER BIOPSY;  Surgeon: Jamesetta So, MD;  Location: [facility];  Service: General;  Laterality: N/A;   right knee     X 2   TOTAL KNEE ARTHROPLASTY Left 09/17/2014   Procedure: TOTAL KNEE ARTHROPLASTY;  Surgeon: Vickey Huger, MD;  Location: [place];  Service: Orthopedics;  Laterality: Left;    Social History   Socioeconomic History   Marital status: Married    Spouse name: Not on file   Number of children: Not on file   Years of education: Not on file   Highest education level: Not on file  Occupational History   Not on file  Tobacco Use   Smoking status: Never   Smokeless tobacco: Current    Types: Snuff  Vaping Use   Vaping Use: Never used  Substance and Sexual Activity   Alcohol use: No    Comment: quit drinking 3 years ago   Drug use: No   Sexual activity: Yes    Birth control/protection: None  Other Topics Concern   Not on file  Social History Narrative   Not on file   Social Determinants of Health   Financial Resource Strain: Not on file  Food Insecurity: Not on file  Transportation Needs: Not on file  Physical Activity: Not on file  Stress: Not on file  Social Connections: Not on file  Intimate Partner Violence: Not on file    Family History  Problem Relation Age of Onset   Diabetes Mother    Hyperlipidemia Mother    Hypertension Mother    Hyperlipidemia Father    Hypertension Father    Diabetes Sister    Hearing loss Sister    Hyperlipidemia Sister    Hypertension Sister    Diabetes Brother    Hyperlipidemia Brother    Hypertension Brother     Anti-infectives: Anti-infectives (From admission, onward)    Start     Dose/Rate Route Frequency Ordered Stop   03/05/22 1500  CIPROFLOXACIN HCL 500 MG PO TABS        500 mg Oral  Once 03/05/22 1455 03/05/22 1337       Current Outpatient Medications  Medication Sig Dispense Refill   acetaminophen (TYLENOL)  650 MG CR tablet Take 650 mg by mouth at bedtime.     bisoprolol-hydrochlorothiazide (ZIAC) 10-6.25 MG per tablet Take 1 tablet by mouth in the morning.     Cholecalciferol (VITAMIN D) 50 MCG (2000 UT) tablet Take 2,000 Units by mouth in the morning.     fenofibrate 160 MG tablet Take 160 mg by mouth in the morning.     levothyroxine (SYNTHROID) 150 MCG tablet Take 150 mcg by mouth daily before breakfast.     loratadine (CLARITIN) 10 MG tablet Take 10 mg by mouth in the morning.     meclizine (ANTIVERT) 25 MG tablet Take 25 mg by mouth 3 (three) times daily as needed for dizziness.     omeprazole (PRILOSEC) 40 MG capsule Take 40 mg by mouth daily before breakfast.     potassium gluconate 595 (99 K) MG TABS tablet Take 595 mg by mouth in the morning.     tadalafil (CIALIS) 5 MG tablet Take 5 mg by mouth every Saturday. In the morning     No current facility-administered medications for this visit.     Objective: Vital signs in last 24 hours: BP (!) 191/76   Pulse (!) 54   Intake/Output from previous day: No intake/output data recorded. Intake/Output this shift: '@IOTHISSHIFT'$ @   Physical Exam Vitals reviewed.  Constitutional:      Appearance: Normal appearance.  Neurological:     Mental Status: He is alert.     Lab Results:  No results found for this or any previous visit (from the past 24 hour(s)).   BMET No results for input(s): "NA", "K", "CL", "CO2", "GLUCOSE", "BUN", "CREATININE", "CALCIUM" in the last 72 hours. PT/INR No results for input(s): "LABPROT", "INR" in the last 72 hours. ABG No results for input(s): "PHART", "HCO3" in the last 72 hours.  Invalid input(s): "PCO2", "PO2" Outside records and labs reviewed.    Cr in EPIC reviewed.  UA has 3+ blood with 3-10 RBC's on micro.  Studies/Results: No results found. CT ABDOMEN PELVIS W CONTRAST  Result Date: 01/02/2022 CLINICAL DATA:  Lower abdominal pain and diarrhea. EXAM: CT ABDOMEN AND PELVIS WITH CONTRAST  TECHNIQUE: Multidetector CT imaging of the abdomen and pelvis was performed using the standard protocol following bolus administration of intravenous contrast. RADIATION DOSE REDUCTION: This exam was performed according to the departmental dose-optimization program which includes automated exposure control, adjustment of the mA and/or kV according to patient size and/or use of iterative reconstruction technique. CONTRAST:  19m OMNIPAQUE IOHEXOL 300 MG/ML  SOLN COMPARISON:  CT abdomen pelvis dated 04/18/2013. FINDINGS: Lower chest: Bibasilar subpleural atelectasis. The visualized lung bases are otherwise clear. There is coronary vascular calcification. No intra-abdominal free air or free fluid. Hepatobiliary: Cirrhosis. A 1 cm hypodense focus in the right lobe of the liver (15/2) appears similar to prior CT, likely a cyst. There is mild biliary ductal dilatation post cholecystectomy. No retained calcified stone noted in the central CBD. Pancreas: Unremarkable. No pancreatic ductal dilatation or surrounding inflammatory changes. Spleen: Normal in size without focal abnormality. Adrenals/Urinary Tract: The adrenal glands are unremarkable. There is no hydronephrosis on either side. There is symmetric enhancement and excretion of contrast by both kidneys. A 1 cm left renal interpolar cyst. No follow-up. The visualized ureters and urinary bladder appear unremarkable. Stomach/Bowel: Scattered sigmoid diverticula without active inflammatory changes. There is moderate stool throughout the colon. There is no bowel obstruction or active inflammation. The appendix is normal. Vascular/Lymphatic: Mild aortoiliac atherosclerotic disease. The IVC is unremarkable. No portal venous gas. There is no adenopathy. Reproductive: The prostate and seminal vesicles are grossly unremarkable. No pelvic mass. Other: None Musculoskeletal: Osteopenia with degenerative changes and scoliosis of the lumbar spine. No acute osseous pathology.  IMPRESSION: 1. No acute intra-abdominal or pelvic pathology. 2. Cirrhosis. 3. Sigmoid diverticulosis. No bowel obstruction. Normal appendix. 4.  Aortic Atherosclerosis (ICD10-I70.0). Electronically Signed   By: AAnner CreteM.D.   On: 01/02/2022 02:52    Procedure: Flexible cystoscopy.  He was prepped with betadine and 2% lidocaine jelly and the given '500mg'$  po cipro.  The scope was passed and the urethral was normal.  The sphincter was intact.  The prostate was 2-3cm with bilobar hyperplasia with coaptation.  The bladder had mild trabeculation without mucosal lesions.  The UO's were normal.   Assessment/Plan: Microhematuria.  Negative w/u.  The hematuria is probably related to his CKD.   He will just need f/u as needed.   BPH with BOO and bilobar hyperplasia on cystoscsopy. .  He has moderate LUTS with minimal bother.  CKD3.   He is waiting for a referral back to nephrology from Dr. GDelanna Ahmadioffice.   HTN.   His BP is up today but he has been anxious about the cystoscopy.   He will be getting set up for an appointment with Dr. GHilma Favorssoon.   Meds ordered this encounter  Medications   ciprofloxacin (CIPRO) tablet 500 mg      Orders Placed This Encounter  Procedures   Microscopic Examination   Urinalysis, Routine w reflex microscopic     Return if symptoms worsen or fail to improve.    CC: Dr. JSharilyn Sites      JIrine Seal1/19/2024

## 2022-03-23 ENCOUNTER — Encounter (INDEPENDENT_AMBULATORY_CARE_PROVIDER_SITE_OTHER): Payer: Self-pay | Admitting: Gastroenterology

## 2022-03-23 ENCOUNTER — Ambulatory Visit (INDEPENDENT_AMBULATORY_CARE_PROVIDER_SITE_OTHER): Payer: Medicare HMO | Admitting: Gastroenterology

## 2022-03-23 VITALS — BP 188/94 | HR 65 | Temp 98.5°F | Ht 73.0 in | Wt 236.8 lb

## 2022-03-23 DIAGNOSIS — K7460 Unspecified cirrhosis of liver: Secondary | ICD-10-CM

## 2022-03-23 NOTE — Patient Instructions (Addendum)
It was nice to meet you! -As discussed, with findings of cirrhosis on recent CT, we need to do further evaluation to rule out any underlying causes that would need to be treated. -We will need to do routine labs and imaging of the liver every 6 months- -Continue to avoid all alcohol - Reduce salt intake to <2 g per day - Can take Tylenol max of 2 g per day (650 mg q8h) for pain - Avoid NSAIDs for pain - Avoid eating raw oysters/shellfish - Ensure protein shakes every night before going to sleep for additional protein supplementation  Your Blood pressure was elevated here today, please follow up with PCP regarding management of this   Please let me know if abdominal pain recurs, or if you have any swelling to your abdomen, confusion, changes in skin color or itching  Please reach out to PCP regarding nephrologist referral  Follow up 6 months

## 2022-03-23 NOTE — Progress Notes (Addendum)
Referring Provider: Sharilyn Sites, MD Primary Care Physician:  Sharilyn Sites, MD Primary GI Physician: new   Chief Complaint  Patient presents with   Abdominal Pain    New patient referred for abdominal pain. Had the pain for 2 -3 weeks and reports pain stopped back in November and reports not having any symptoms now.    HPI:   Harold Nicholson is a 72 y.o. male with past medical history of arthritis, hiatal hernia, high cholesterol, HTN, Hypothyroidism   Patient presenting today as a new patient for abdominal pain   Labs in November with ALT 9, Alk phos 45, AST 45 T bili 0.3, plt 344k.  Patient notes that he had some periumbilical pain a few months ago that went across his abdomen, also notably began having blood in his urine at that time.  Had some diarrhea that came on later. He notes that pain eventually subsided on its own. No current pain. No associated nausea or vomiting. Denies rectal bleeding or melena. Diarrhea also resolved. Having a BM a few times per day which is baseline.  He had lost about 20 pounds when having abdominal pain, reports he had decreased appetite at that time. Appetite feels back to normal now. Is starting to gain weight back. He denies any current GI symptoms. Still having occult blood in his urine that he is awaiting nephrology evaluation for as urology evaluation was unremarkable.  States there was mention of cirrhosis after his CT though no further workup was done. Denies any episodes of swelling of his abdomen, episodes of confusion, no LE edema. Denies jaundice or pruritus.   NSAID use: none Social hx: no etoh or tobacco currently, previously a heavy drinker, stopped in 2010 Fam hx: no crc or liver disease   CT A/P with contrast: November 2023 cirrhosis. 1 cm hypodense focus in the right lobe of the liver (15/2) appears similar to prior CT, likely a cyst. There is mild biliary ductal dilatation post cholecystectomy. No retained calcified stone noted in  the central CBD. (LFTs normal at this time) Last Colonoscopy:08/2021 Dr. Andris Flurry,  - The entire examined colon is normal on direct and   retroflexion views. - No specimens collected. - Hemorrhoids. Last Endoscopy:09/2010, gastritis, hiatal hernia   Recommendations:    Past Medical History:  Diagnosis Date   Arthritis    Complication of anesthesia    pt stated he stopped breathing during surgery   Hiatal hernia    Hypercholesteremia    Hypertension    Hypothyroidism    Impaired fasting glucose    Pure hyperglyceridemia    Thyroid disease    Hypothyroid     Past Surgical History:  Procedure Laterality Date   CHOLECYSTECTOMY     CHOLECYSTECTOMY N/A 05/01/2013   Procedure: LAPAROSCOPIC CHOLECYSTECTOMY;  Surgeon: Jamesetta So, MD;  Location: [facility];  Service: General;  Laterality: N/A;   COLONOSCOPY  10/07/2010   Procedure: COLONOSCOPY;  Surgeon: Jamesetta So;  [facility];  Service: Gastroenterology;  Laterality: N/A;   COLONOSCOPY WITH PROPOFOL N/A 09/02/2021   Procedure: COLONOSCOPY WITH PROPOFOL;  Surgeon: Rusty Aus, DO;  [facility];  Service: General;  Laterality: N/A;   ESOPHAGOGASTRODUODENOSCOPY  10/07/2010   Procedure: ESOPHAGOGASTRODUODENOSCOPY (EGD);  Surgeon: Jamesetta So;  [facility];  Service: Gastroenterology;  Laterality: N/A;   EYE SURGERY     JOINT REPLACEMENT     Right knee   Left Knee Arthroscopy  LIVER BIOPSY N/A 05/01/2013   Procedure: LIVER BIOPSY;  Surgeon: Jamesetta So, MD;  Location: [facility];  Service: General;  Laterality: N/A;   right knee     X 2   TOTAL KNEE ARTHROPLASTY Left 09/17/2014   Procedure: TOTAL KNEE ARTHROPLASTY;  Surgeon: Vickey Huger, MD;  Location: [place];  Service: Orthopedics;  Laterality: Left;    Current Outpatient Medications  Medication Sig Dispense Refill   bisoprolol-hydrochlorothiazide (ZIAC) 10-6.25 MG per tablet Take 1 tablet by mouth in the morning.      Cholecalciferol (VITAMIN D) 50 MCG (2000 UT) tablet Take 2,000 Units by mouth in the morning.     fenofibrate 160 MG tablet Take 160 mg by mouth in the morning.     levothyroxine (SYNTHROID) 150 MCG tablet Take 150 mcg by mouth daily before breakfast.     loratadine (CLARITIN) 10 MG tablet Take 10 mg by mouth in the morning.     meclizine (ANTIVERT) 25 MG tablet Take 25 mg by mouth 3 (three) times daily as needed for dizziness.     omeprazole (PRILOSEC) 40 MG capsule Take 40 mg by mouth daily before breakfast.     potassium gluconate 595 (99 K) MG TABS tablet Take 595 mg by mouth in the morning.     tadalafil (CIALIS) 5 MG tablet Take 5 mg by mouth every Saturday. In the morning     No current facility-administered medications for this visit.    Allergies as of 03/23/2022   (No Known Allergies)    Family History  Problem Relation Age of Onset   Diabetes Mother    Hyperlipidemia Mother    Hypertension Mother    Hyperlipidemia Father    Hypertension Father    Diabetes Sister    Hearing loss Sister    Hyperlipidemia Sister    Hypertension Sister    Diabetes Brother    Hyperlipidemia Brother    Hypertension Brother     Social History   Socioeconomic History   Marital status: Married    Spouse name: Not on file   Number of children: Not on file   Years of education: Not on file   Highest education level: Not on file  Occupational History   Not on file  Tobacco Use   Smoking status: Never    Passive exposure: Never   Smokeless tobacco: Current    Types: Snuff  Vaping Use   Vaping Use: Never used  Substance and Sexual Activity   Alcohol use: No    Comment: quit drinking 3 years ago   Drug use: No   Sexual activity: Yes    Birth control/protection: None  Other Topics Concern   Not on file  Social History Narrative   Not on file   Social Determinants of Health   Financial Resource Strain: Not on file  Food Insecurity: Not on file  Transportation Needs: Not on  file  Physical Activity: Not on file  Stress: Not on file  Social Connections: Not on file   Review of systems General: negative for malaise, night sweats, fever, chills, weight loss Neck: Negative for lumps, goiter, pain and significant neck swelling Resp: Negative for cough, wheezing, dyspnea at rest CV: Negative for chest pain, leg swelling, palpitations, orthopnea GI: denies melena, hematochezia, nausea, vomiting, diarrhea, constipation, dysphagia, odyonophagia, early satiety or unintentional weight loss.  MSK: Negative for joint pain or swelling, back pain, and muscle pain. Derm: Negative for itching or rash Psych: Denies depression, anxiety, memory  loss, confusion. No homicidal or suicidal ideation.  Heme: Negative for prolonged bleeding, bruising easily, and swollen nodes. Endocrine: Negative for cold or heat intolerance, polyuria, polydipsia and goiter. Neuro: negative for tremor, gait imbalance, syncope and seizures. The remainder of the review of systems is noncontributory.  Physical Exam: BP (!) 188/94 (BP Location: Left Arm, Patient Position: Sitting, Cuff Size: Large)   Pulse 65   Temp 98.5 F (36.9 C) (Oral)   Ht '6\' 1"'$  (1.854 m)   Wt 236 lb 12.8 oz (107.4 kg)   BMI 31.24 kg/m  General:   Alert and oriented. No distress noted. Pleasant and cooperative.  Head:  Normocephalic and atraumatic. Eyes:  Conjuctiva clear without scleral icterus. Mouth:  Oral mucosa pink and moist. Good dentition. No lesions. Heart: Normal rate and rhythm, s1 and s2 heart sounds present.  Lungs: Clear lung sounds in all lobes. Respirations equal and unlabored. Abdomen:  +BS, soft, non-tender and non-distended. No rebound or guarding. No HSM or masses noted. Derm: No palmar erythema or jaundice Msk:  Symmetrical without gross deformities. Normal posture. Extremities:  Without edema. Neurologic:  Alert and  oriented x4 Psych:  Alert and cooperative. Normal mood and affect.  Invalid  input(s): "6 MONTHS"   ASSESSMENT: KIJUAN GALLICCHIO is a 72 y.o. male presenting today as a new patient for abdominal pain, now resolved, also with new findings of cirrhosis.   Abdominal pain: now resolved. Also with some associated occult blood in urine, query if this is more a renal issue. As he is not currently having GI symptoms, will hold off on further evaluation but he will make me aware of any further abdominal pain. Colonoscopy in July 2023 was normal.  Cirrhosis: new findings on recent CT A/P, last LFTs in November were WNL as was plt count. Discussion had with patient regarding possible etiology of cirrhosis to include fatty liver, AIH, viral infection, iron overload, previous ETOH use as well as implications of disease to include importance of routine labs and imaging every 6 months with increased risk of incidence of HCC, bleeding, especially with increased risk of esophageal varices, retention of ammonia/fluid, importance of avoiding alcohol and NSAIDs. Pt verbalized understanding of this. Will proceed with liver serologies to rule out other underlying causes of cirrhosis, MELD labs and AFP. Due for repeat liver imaging in May 2023.  The patient was found to have elevated blood pressure when vital signs were checked in the office. The blood pressure was rechecked by the nursing staff and it was found be persistently elevated >140/90 mmHg. I personally advised to the patient to follow up closely with PCP for hypertension control.   PLAN:  -Liver serologies, MELD labs, AFP  -Repeat US May 2023  - Follow up with PCP regarding BP - Contact PCP about neprhology referral  -Pt to make me aware of recurrence of abdominal pain - Reduce salt intake to <2 g per day - Can take Tylenol max of 2 g per day (650 mg q8h) for pain - Avoid NSAIDs for pain - Avoid eating raw oysters/shellfish - Ensure every night before going to sleep  All questions were answered, patient verbalized understanding and  is in agreement with plan as outlined above.    Follow Up: 6 months    Analie Katzman L. Alver Sorrow, MSN, APRN, AGNP-C Adult-Gerontology Nurse Practitioner [facility] I have reviewed the note and agree with the APP's assessment as described in this progress note  Maylon Peppers, MD Gastroenterology and  [facility]

## 2022-03-27 LAB — ALPHA-1-ANTITRYPSIN: A-1 Antitrypsin, Ser: 127 mg/dL (ref 83–199)

## 2022-03-27 LAB — COMPREHENSIVE METABOLIC PANEL
AG Ratio: 1.3 (calc) (ref 1.0–2.5)
ALT: 14 U/L (ref 9–46)
AST: 24 U/L (ref 10–35)
Albumin: 3.8 g/dL (ref 3.6–5.1)
Alkaline phosphatase (APISO): 33 U/L — ABNORMAL LOW (ref 35–144)
BUN/Creatinine Ratio: 9 (calc) (ref 6–22)
BUN: 19 mg/dL (ref 7–25)
CO2: 25 mmol/L (ref 20–32)
Calcium: 9.7 mg/dL (ref 8.6–10.3)
Chloride: 109 mmol/L (ref 98–110)
Creat: 2.11 mg/dL — ABNORMAL HIGH (ref 0.70–1.35)
Globulin: 3 g/dL (calc) (ref 1.9–3.7)
Glucose, Bld: 77 mg/dL (ref 65–99)
Potassium: 4.9 mmol/L (ref 3.5–5.3)
Sodium: 142 mmol/L (ref 135–146)
Total Bilirubin: 0.5 mg/dL (ref 0.2–1.2)
Total Protein: 6.8 g/dL (ref 6.1–8.1)

## 2022-03-27 LAB — HEPATITIS PANEL, ACUTE
Hep A IgM: NONREACTIVE
Hep B C IgM: NONREACTIVE
Hepatitis B Surface Ag: NONREACTIVE
Hepatitis C Ab: NONREACTIVE

## 2022-03-27 LAB — CBC
HCT: 38.6 % (ref 38.5–50.0)
Hemoglobin: 12.8 g/dL — ABNORMAL LOW (ref 13.2–17.1)
MCH: 32.2 pg (ref 27.0–33.0)
MCHC: 33.2 g/dL (ref 32.0–36.0)
MCV: 97 fL (ref 80.0–100.0)
MPV: 12.3 fL (ref 7.5–12.5)
Platelets: 181 10*3/uL (ref 140–400)
RBC: 3.98 10*6/uL — ABNORMAL LOW (ref 4.20–5.80)
RDW: 12.7 % (ref 11.0–15.0)
WBC: 5.9 10*3/uL (ref 3.8–10.8)

## 2022-03-27 LAB — IGG, IGA, IGM
IgG (Immunoglobin G), Serum: 1347 mg/dL (ref 600–1540)
IgM, Serum: 69 mg/dL (ref 50–300)
Immunoglobulin A: 629 mg/dL — ABNORMAL HIGH (ref 70–320)

## 2022-03-27 LAB — ANTI-NUCLEAR AB-TITER (ANA TITER)
ANA TITER: 1:80 {titer} — ABNORMAL HIGH
ANA Titer 1: 1:80 {titer} — ABNORMAL HIGH

## 2022-03-27 LAB — PROTIME-INR
INR: 1.2 — ABNORMAL HIGH
Prothrombin Time: 12.4 s — ABNORMAL HIGH (ref 9.0–11.5)

## 2022-03-27 LAB — ANA: Anti Nuclear Antibody (ANA): POSITIVE — AB

## 2022-03-27 LAB — IRON, TOTAL/TOTAL IRON BINDING CAP
%SAT: 36 % (calc) (ref 20–48)
Iron: 115 ug/dL (ref 50–180)
TIBC: 316 mcg/dL (calc) (ref 250–425)

## 2022-03-27 LAB — CERULOPLASMIN: Ceruloplasmin: 19 mg/dL (ref 18–36)

## 2022-03-27 LAB — AFP TUMOR MARKER: AFP-Tumor Marker: 5.2 ng/mL (ref <6.1)

## 2022-03-27 LAB — MITOCHONDRIAL ANTIBODIES: Mitochondrial M2 Ab, IgG: <=20 U (ref <=20.0)

## 2022-03-27 LAB — ANTI-SMOOTH MUSCLE ANTIBODY, IGG: Actin (Smooth Muscle) Antibody (IGG): 22 U — ABNORMAL HIGH (ref <20)

## 2022-04-01 ENCOUNTER — Other Ambulatory Visit: Payer: Self-pay | Admitting: *Deleted

## 2022-04-01 DIAGNOSIS — K7460 Unspecified cirrhosis of liver: Secondary | ICD-10-CM

## 2022-04-08 ENCOUNTER — Other Ambulatory Visit: Payer: Self-pay | Admitting: Internal Medicine

## 2022-04-08 DIAGNOSIS — K7460 Unspecified cirrhosis of liver: Secondary | ICD-10-CM

## 2022-04-08 NOTE — H&P (Signed)
Chief Complaint: Patient was seen in consultation today for cirrhosis of liver at the request of [place]  Referring Physician(s): [place]  Supervising Physician: Markus Daft  Patient Status: [facility] - Out-pt  History of Present Illness: Harold Nicholson is a 72 y.o. male who underwent CT [facility] 01/02/22 that resulted cirrhosis with no further work up was completed. He presented to GI 03/23/22 c/o abdominal pain, decreased appetite and weight loss. Workup at the time showed normal liver enzymes but positive ANA. Pt has been referred to IR for random liver biopsy to rule out autoimmune hepatitis.   Past Medical History:  Diagnosis Date   Arthritis    Complication of anesthesia    pt stated he stopped breathing during surgery   Hiatal hernia    Hypercholesteremia    Hypertension    Hypothyroidism    Impaired fasting glucose    Pure hyperglyceridemia    Thyroid disease    Hypothyroid     Past Surgical History:  Procedure Laterality Date   CHOLECYSTECTOMY     CHOLECYSTECTOMY N/A 05/01/2013   Procedure: LAPAROSCOPIC CHOLECYSTECTOMY;  Surgeon: Jamesetta So, MD;  Location: [facility];  Service: General;  Laterality: N/A;   COLONOSCOPY  10/07/2010   Procedure: COLONOSCOPY;  Surgeon: Jamesetta So;  Location: [facility];  Service: Gastroenterology;  Laterality: N/A;   COLONOSCOPY WITH PROPOFOL N/A 09/02/2021   Procedure: COLONOSCOPY WITH PROPOFOL;  Surgeon: Rusty Aus, DO;  Location: [facility];  Service: General;  Laterality: N/A;   ESOPHAGOGASTRODUODENOSCOPY  10/07/2010   Procedure: ESOPHAGOGASTRODUODENOSCOPY (EGD);  Surgeon: Jamesetta So;  Location: [facility];  Service: Gastroenterology;  Laterality: N/A;   EYE SURGERY     JOINT REPLACEMENT     Right knee   Left Knee Arthroscopy     LIVER BIOPSY N/A 05/01/2013   Procedure: LIVER BIOPSY;  Surgeon: Jamesetta So, MD;  Location: [facility];  Service: General;  Laterality: N/A;   right knee     X 2   TOTAL KNEE  ARTHROPLASTY Left 09/17/2014   Procedure: TOTAL KNEE ARTHROPLASTY;  Surgeon: Vickey Huger, MD;  Location: [place];  Service: Orthopedics;  Laterality: Left;    Allergies: Patient has no known allergies.  Medications: Prior to Admission medications   Medication Sig Start Date End Date Taking? Authorizing Provider  bisoprolol-hydrochlorothiazide (ZIAC) 10-6.25 MG per tablet Take 1 tablet by mouth in the morning.    [provider]  Cholecalciferol (VITAMIN D) 50 MCG (2000 UT) tablet Take 2,000 Units by mouth in the morning.    [provider]  fenofibrate 160 MG tablet Take 160 mg by mouth in the morning.    [provider]  levothyroxine (SYNTHROID) 150 MCG tablet Take 150 mcg by mouth daily before breakfast. 05/09/19   [provider]  loratadine (CLARITIN) 10 MG tablet Take 10 mg by mouth in the morning.    [provider]  meclizine (ANTIVERT) 25 MG tablet Take 25 mg by mouth 3 (three) times daily as needed for dizziness. 06/24/21   [provider]  omeprazole (PRILOSEC) 40 MG capsule Take 40 mg by mouth daily before breakfast.    [provider]  potassium gluconate 595 (99 K) MG TABS tablet Take 595 mg by mouth in the morning.    [provider]  tadalafil (CIALIS) 5 MG tablet Take 5 mg by mouth every Saturday. In the morning 07/10/19   [provider]     Family History  Problem Relation Age of Onset   Diabetes Mother    Hyperlipidemia Mother    Hypertension Mother    Hyperlipidemia Father    Hypertension Father    Diabetes Sister    Hearing loss Sister    Hyperlipidemia Sister    Hypertension Sister    Diabetes Brother    Hyperlipidemia Brother    Hypertension Brother     Social History   Socioeconomic History   Marital status: Married    Spouse name: Not on file   Number of children: Not on file   Years of education: Not on file   Highest education level: Not on file  Occupational History    Not on file  Tobacco Use   Smoking status: Never    Passive exposure: Never   Smokeless tobacco: Current    Types: Snuff  Vaping Use   Vaping Use: Never used  Substance and Sexual Activity   Alcohol use: No    Comment: quit drinking 3 years ago   Drug use: No   Sexual activity: Yes    Birth control/protection: None  Other Topics Concern   Not on file  Social History Narrative   Not on file   Social Determinants of Health   Financial Resource Strain: Not on file  Food Insecurity: Not on file  Transportation Needs: Not on file  Physical Activity: Not on file  Stress: Not on file  Social Connections: Not on file    Review of Systems: A 12 point ROS discussed and pertinent positives are indicated in the HPI above.  All other systems are negative.  Review of Systems  Vital Signs: There were no vitals taken for this visit.  Advance Care Plan: {Advance Care [id number]    Physical Exam  Imaging: No results found.  Labs:  CBC: Recent Labs    03/23/22 1112  WBC 5.9  HGB 12.8*  HCT 38.6  PLT 181    COAGS: Recent Labs    03/23/22 1112  INR 1.2*    BMP: Recent Labs    08/29/21 0915 01/01/22 1141 03/23/22 1112  NA 137  --  142  K 4.6  --  4.9  CL 107  --  109  CO2 24  --  25  GLUCOSE 99  --  77  BUN 25*  --  19  CALCIUM 9.5  --  9.7  CREATININE 2.13* 2.30* 2.11*  GFRNONAA 33*  --   --     LIVER FUNCTION TESTS: Recent Labs    03/23/22 1112  BILITOT 0.5  AST 24  ALT 14  PROT 6.8    TUMOR MARKERS: Recent Labs    03/23/22 1112  AFPTM 5.2    Assessment and Plan:  72 yo male with PMH significant for arthritis, HTN and hypothyroidism presents to IR for random liver biopsy.   Risks and benefits of random liver biopsy with moderate sedation  was discussed with the patient and/or patient's family including, but not limited to bleeding, infection, damage to adjacent structures or low yield requiring additional tests.  All of the questions  were answered and there is agreement to proceed.  Consent signed and in chart.  Thank you for this interesting consult.  I greatly enjoyed meeting Harold Nicholson and look forward to participating in their care.  A copy of this report was sent to the requesting provider on this date.  Electronically Signed: Tyson Alias, NP 04/08/2022, 3:16 PM   I spent a total  of {New [id number] {New Out-Pt:[id number]}  {Established Out-Pt:[id number]} in face to face in clinical consultation, greater than 50% of which was counseling/coordinating care for cirrhosis.

## 2022-04-09 ENCOUNTER — Encounter (HOSPITAL_COMMUNITY): Payer: Self-pay

## 2022-04-09 ENCOUNTER — Ambulatory Visit (HOSPITAL_COMMUNITY)
Admission: RE | Admit: 2022-04-09 | Discharge: 2022-04-09 | Disposition: A | Discharge status: Home / Self Care | Payer: Medicare HMO | Source: Ambulatory Visit | Attending: Gastroenterology | Admitting: Gastroenterology

## 2022-04-09 ENCOUNTER — Other Ambulatory Visit: Payer: Self-pay

## 2022-04-09 DIAGNOSIS — Z0389 Encounter for observation for other suspected diseases and conditions ruled out: Secondary | ICD-10-CM | POA: Diagnosis not present

## 2022-04-09 DIAGNOSIS — R768 Other specified abnormal immunological findings in serum: Secondary | ICD-10-CM | POA: Insufficient documentation

## 2022-04-09 DIAGNOSIS — K754 Autoimmune hepatitis: Secondary | ICD-10-CM | POA: Diagnosis not present

## 2022-04-09 DIAGNOSIS — B189 Chronic viral hepatitis, unspecified: Secondary | ICD-10-CM | POA: Insufficient documentation

## 2022-04-09 DIAGNOSIS — K7460 Unspecified cirrhosis of liver: Secondary | ICD-10-CM | POA: Insufficient documentation

## 2022-04-09 LAB — CBC WITH DIFFERENTIAL/PLATELET
Abs Immature Granulocytes: 0.02 10*3/uL (ref 0.00–0.07)
Basophils Absolute: 0 10*3/uL (ref 0.0–0.1)
Basophils Relative: 0 %
Eosinophils Absolute: 0.3 10*3/uL (ref 0.0–0.5)
Eosinophils Relative: 4 %
HCT: 39.9 % (ref 39.0–52.0)
Hemoglobin: 13 g/dL (ref 13.0–17.0)
Immature Granulocytes: 0 %
Lymphocytes Relative: 30 %
Lymphs Abs: 1.8 10*3/uL (ref 0.7–4.0)
MCH: 32.1 pg (ref 26.0–34.0)
MCHC: 32.6 g/dL (ref 30.0–36.0)
MCV: 98.5 fL (ref 80.0–100.0)
Monocytes Absolute: 0.4 10*3/uL (ref 0.1–1.0)
Monocytes Relative: 7 %
Neutro Abs: 3.5 10*3/uL (ref 1.7–7.7)
Neutrophils Relative %: 59 %
Platelets: 176 10*3/uL (ref 150–400)
RBC: 4.05 MIL/uL — ABNORMAL LOW (ref 4.22–5.81)
RDW: 13.2 % (ref 11.5–15.5)
WBC: 6 10*3/uL (ref 4.0–10.5)
nRBC: 0 % (ref 0.0–0.2)

## 2022-04-09 LAB — PROTIME-INR
INR: 1.2 (ref 0.8–1.2)
Prothrombin Time: 14.7 seconds (ref 11.4–15.2)

## 2022-04-09 LAB — COMPREHENSIVE METABOLIC PANEL
ALT: 21 U/L (ref 0–44)
AST: 34 U/L (ref 15–41)
Albumin: 3.8 g/dL (ref 3.5–5.0)
Alkaline Phosphatase: 34 U/L — ABNORMAL LOW (ref 38–126)
Anion gap: 8 (ref 5–15)
BUN: 21 mg/dL (ref 8–23)
CO2: 27 mmol/L (ref 22–32)
Calcium: 9.4 mg/dL (ref 8.9–10.3)
Chloride: 105 mmol/L (ref 98–111)
Creatinine, Ser: 2.11 mg/dL — ABNORMAL HIGH (ref 0.61–1.24)
GFR, Estimated: 33 mL/min — ABNORMAL LOW (ref >60)
Glucose, Bld: 94 mg/dL (ref 70–99)
Potassium: 4.2 mmol/L (ref 3.5–5.1)
Sodium: 140 mmol/L (ref 135–145)
Total Bilirubin: 0.6 mg/dL (ref 0.3–1.2)
Total Protein: 7.4 g/dL (ref 6.5–8.1)

## 2022-04-09 MED ORDER — SODIUM CHLORIDE 0.9 % IV SOLN: INTRAVENOUS | Status: DC

## 2022-04-09 MED ORDER — FENTANYL CITRATE (PF) 100 MCG/2ML IJ SOLN
INTRAMUSCULAR | Status: AC | PRN
  Administered 2022-04-09: 75 ug via INTRAVENOUS
  Administered 2022-04-09: 25 ug via INTRAVENOUS

## 2022-04-09 MED ORDER — HYDROCODONE-ACETAMINOPHEN 5-325 MG PO TABS: 1.0000 | ORAL_TABLET | ORAL | Status: DC | PRN

## 2022-04-09 MED ORDER — LIDOCAINE HCL 1 % IJ SOLN
INTRAMUSCULAR | Status: AC
  Administered 2022-04-09: 15 mL
  Filled 2022-04-09: qty 20

## 2022-04-09 MED ORDER — FENTANYL CITRATE (PF) 100 MCG/2ML IJ SOLN
INTRAMUSCULAR | Status: AC
  Filled 2022-04-09: qty 2

## 2022-04-09 MED ORDER — MIDAZOLAM HCL 2 MG/2ML IJ SOLN
INTRAMUSCULAR | Status: AC
  Filled 2022-04-09: qty 2

## 2022-04-09 MED ORDER — MIDAZOLAM HCL 2 MG/2ML IJ SOLN
INTRAMUSCULAR | Status: AC | PRN
  Administered 2022-04-09: 1 mg via INTRAVENOUS
  Administered 2022-04-09: .5 mg via INTRAVENOUS
  Administered 2022-04-09: 1.5 mg via INTRAVENOUS

## 2022-04-09 MED ORDER — GELATIN ABSORBABLE 12-7 MM EX MISC
CUTANEOUS | Status: AC
  Administered 2022-04-09: 1
  Filled 2022-04-09: qty 1

## 2022-04-09 NOTE — Progress Notes (Signed)
1630 Ambulated to the BR without difficulty.

## 2022-04-09 NOTE — Procedures (Signed)
Interventional Radiology Procedure:   Indications: Evaluate for cirrhosis and autoimmune hepatitis.  Positive ANA   Procedure: US guided liver biopsy  Findings: 3 cores from right hepatic lobe.  Gelfoam slurry injected along biopsy tract.  Complications: No immediate complications noted.     EBL: Minimal  Plan: Bedrest 3 hours   Detra Bores R. Anselm Pancoast, MD  Pager: [phone]

## 2022-04-09 NOTE — Discharge Instructions (Signed)
Discharge Instructions:   Please call Interventional Radiology clinic [phone] with any questions or concerns.  You may remove your dressing and shower tomorrow.  Moderate Conscious Sedation, Adult, Care After This sheet gives you information about how to care for yourself after your procedure. Your health care provider may also give you more specific instructions. If you have problems or questions, contact your health care provider. What can I expect after the procedure? After the procedure, it is common to have: Sleepiness for several hours. Impaired judgment for several hours. Difficulty with balance. Vomiting if you eat too soon. Follow these instructions at home: For the time period you were told by your health care provider: Rest. Do not participate in activities where you could fall or become injured. Do not drive or use machinery. Do not drink alcohol. Do not take sleeping pills or medicines that cause drowsiness. Do not make important decisions or sign legal documents. Do not take care of children on your own. Eating and drinking  Follow the diet recommended by your health care provider. Drink enough fluid to keep your urine pale yellow. If you vomit: Drink water, juice, or soup when you can drink without vomiting. Make sure you have little or no nausea before eating solid foods. General instructions Take over-the-counter and prescription medicines only as told by your health care provider. Have a responsible adult stay with you for the time you are told. It is important to have someone help care for you until you are awake and alert. Do not smoke. Keep all follow-up visits as told by your health care provider. This is important. Contact a health care provider if: You are still sleepy or having trouble with balance after 24 hours. You feel light-headed. You keep feeling nauseous or you keep vomiting. You develop a rash. You have a fever. You have redness or  swelling around the IV site. Get help right away if: You have trouble breathing. You have new-onset confusion at home. Summary After the procedure, it is common to feel sleepy, have impaired judgment, or feel nauseous if you eat too soon. Rest after you get home. Know the things you should not do after the procedure. Follow the diet recommended by your health care provider and drink enough fluid to keep your urine pale yellow. Get help right away if you have trouble breathing or new-onset confusion at home. This information is not intended to replace advice given to you by your health care provider. Make sure you discuss any questions you have with your health care provider. Document Revised: 06/02/2019 Document Reviewed: 12/29/2018 Elsevier Patient Education  [place].   Liver Biopsy, Care After The following information offers guidance on how to care for yourself after your procedure. Your health care provider may also give you more specific instructions. If you have problems or questions, contact your health care provider. What can I expect after the procedure? After your procedure, it is common to: Have pain and soreness in the area where the biopsy was done. Have bruising around the area where the biopsy was done. Feel tired for 1-2 days. Follow these instructions at home: Medicines Take over-the-counter and prescription medicines only as told by your health care provider. If you were prescribed an antibiotic medicine, take it as told by your health care provider. Do not stop taking the antibiotic, even if you start to feel better. Do not take medicines, such as aspirin and ibuprofen, unless your doctor tells you to take them. Ask your  health care provider if the medicine prescribed to you: Requires you to avoid driving or using machinery. Can cause constipation. You may need to take these actions to prevent or treat constipation: Drink enough fluid to keep your urine pale  yellow. Take over-the-counter or prescription medicines. Eat foods that are high in fiber, such as beans, whole grains, and fresh fruits and vegetables. Limit foods that are high in fat and processed sugars, such as fried or sweet foods. Incision care Follow instructions from your health care provider about how to take care of your incisions. Make sure you: Wash your hands with soap and water for at least 20 seconds before and after you change your bandage (dressing). If soap and water are not available, use hand sanitizer. Change your dressing as told by your health care provider. Leave stitches (sutures), skin glue, or adhesive strips in place. These skin closures may need to stay in place for 2 weeks or longer. If adhesive strip edges start to loosen and curl up, you may trim the loose edges. Do not remove adhesive strips completely unless your health care provider tells you to do that. Check your incision areas every day for signs of infection. Check for: Redness, swelling, or more pain. Fluid or blood. Warmth. Pus or a bad smell. Do not take baths, swim, or use a hot tub until your health care provider says it is okay to do so. Activity Rest at home for 1-2 days, or as directed by your health care provider. Avoid sitting for a long time without moving. Get up to take short walks every 1-2 hours. This is important to improve blood flow and breathing. Ask for help if you feel weak or unsteady. Do not lift anything that is heavier than 10 lb (4.5 kg), or the limit that your health care provider tells you, until he or she says that it is safe. Do not play contact sports for 2 weeks after the procedure. Return to your normal activities as told by your health care provider. Ask your health care provider what activities are safe for you. General instructions  Do not drink alcohol in the first week after the procedure. Plan to have a responsible adult care for you for the time you are told after  you leave the hospital or clinic. This is important. It is up to you to get the results of your procedure. Ask your health care provider, or the department that is doing the procedure, when your results will be ready. Keep all follow-up visits. This is important. Contact a health care provider if: You have increased bleeding from an incision. You have redness, swelling, or increasing pain in any incisions. You notice a discharge or a bad smell coming from any of your incisions. You develop a rash. You have a fever or chills. Get help right away if: You develop swelling, bloating, or pain in your abdomen. You become dizzy or faint. You have nausea or vomiting. You have shortness of breath or difficulty breathing. You develop chest pain. You have problems with your speech or vision. You have trouble with your balance or moving your arms or legs. These symptoms may represent a serious problem that is an emergency. Do not wait to see if the symptoms will go away. Get medical help right away. Call your local emergency services (911 in the [place] Do not drive yourself to the hospital. Summary After the liver biopsy, it is common to have pain, soreness, and bruising in the area,  as well as tiredness (fatigue). Take over-the-counter and prescription medicines only as told by your health care provider. Follow instructions from your health care provider about how to care for your incisions. Check your incision areas daily for signs of infection. This information is not intended to replace advice given to you by your health care provider. Make sure you discuss any questions you have with your health care provider. Document Revised: 12/18/2019 Document Reviewed: 12/18/2019 Elsevier Patient Education  2023 [employer].Liver Biopsy, Care After The following information offers guidance on how to care for yourself after your procedure. Your health care provider may also give you more specific  instructions. If you have problems or questions, contact your health care provider. What can I expect after the procedure? After your procedure, it is common to: Have pain and soreness in the area where the biopsy was done. Have bruising around the area where the biopsy was done. Feel tired for 1-2 days. Follow these instructions at home: Medicines Take over-the-counter and prescription medicines only as told by your health care provider. If you were prescribed an antibiotic medicine, take it as told by your health care provider. Do not stop taking the antibiotic, even if you start to feel better. Do not take medicines, such as aspirin and ibuprofen, unless your doctor tells you to take them. Ask your health care provider if the medicine prescribed to you: Requires you to avoid driving or using machinery. Can cause constipation. You may need to take these actions to prevent or treat constipation: Drink enough fluid to keep your urine pale yellow. Take over-the-counter or prescription medicines. Eat foods that are high in fiber, such as beans, whole grains, and fresh fruits and vegetables. Limit foods that are high in fat and processed sugars, such as fried or sweet foods. Incision care Follow instructions from your health care provider about how to take care of your incisions. Make sure you: Wash your hands with soap and water for at least 20 seconds before and after you change your bandage (dressing). If soap and water are not available, use hand sanitizer. Change your dressing as told by your health care provider. Leave stitches (sutures), skin glue, or adhesive strips in place. These skin closures may need to stay in place for 2 weeks or longer. If adhesive strip edges start to loosen and curl up, you may trim the loose edges. Do not remove adhesive strips completely unless your health care provider tells you to do that. Check your incision areas every day for signs of infection. Check  for: Redness, swelling, or more pain. Fluid or blood. Warmth. Pus or a bad smell. Do not take baths, swim, or use a hot tub until your health care provider says it is okay to do so. Activity Rest at home for 1-2 days, or as directed by your health care provider. Avoid sitting for a long time without moving. Get up to take short walks every 1-2 hours. This is important to improve blood flow and breathing. Ask for help if you feel weak or unsteady. Do not lift anything that is heavier than 10 lb (4.5 kg), or the limit that your health care provider tells you, until he or she says that it is safe. Do not play contact sports for 2 weeks after the procedure. Return to your normal activities as told by your health care provider. Ask your health care provider what activities are safe for you. General instructions  Do not drink alcohol in the first  week after the procedure. Plan to have a responsible adult care for you for the time you are told after you leave the hospital or clinic. This is important. It is up to you to get the results of your procedure. Ask your health care provider, or the department that is doing the procedure, when your results will be ready. Keep all follow-up visits. This is important. Contact a health care provider if: You have increased bleeding from an incision. You have redness, swelling, or increasing pain in any incisions. You notice a discharge or a bad smell coming from any of your incisions. You develop a rash. You have a fever or chills. Get help right away if: You develop swelling, bloating, or pain in your abdomen. You become dizzy or faint. You have nausea or vomiting. You have shortness of breath or difficulty breathing. You develop chest pain. You have problems with your speech or vision. You have trouble with your balance or moving your arms or legs. These symptoms may represent a serious problem that is an emergency. Do not wait to see if the symptoms  will go away. Get medical help right away. Call your local emergency services (911 in the [place] Do not drive yourself to the hospital. Summary After the liver biopsy, it is common to have pain, soreness, and bruising in the area, as well as tiredness (fatigue). Take over-the-counter and prescription medicines only as told by your health care provider. Follow instructions from your health care provider about how to care for your incisions. Check your incision areas daily for signs of infection. This information is not intended to replace advice given to you by your health care provider. Make sure you discuss any questions you have with your health care provider. Document Revised: 12/18/2019 Document Reviewed: 12/18/2019 Elsevier Patient Education  [place].

## 2022-04-15 LAB — SURGICAL PATHOLOGY

## 2022-04-22 ENCOUNTER — Other Ambulatory Visit (INDEPENDENT_AMBULATORY_CARE_PROVIDER_SITE_OTHER): Payer: Self-pay | Admitting: Gastroenterology

## 2022-04-22 DIAGNOSIS — K754 Autoimmune hepatitis: Secondary | ICD-10-CM

## 2022-04-23 DIAGNOSIS — K754 Autoimmune hepatitis: Secondary | ICD-10-CM | POA: Diagnosis not present

## 2022-04-27 DIAGNOSIS — R809 Proteinuria, unspecified: Secondary | ICD-10-CM | POA: Diagnosis not present

## 2022-04-27 DIAGNOSIS — E669 Obesity, unspecified: Secondary | ICD-10-CM | POA: Diagnosis not present

## 2022-04-27 DIAGNOSIS — E785 Hyperlipidemia, unspecified: Secondary | ICD-10-CM | POA: Diagnosis not present

## 2022-04-27 DIAGNOSIS — I129 Hypertensive chronic kidney disease with stage 1 through stage 4 chronic kidney disease, or unspecified chronic kidney disease: Secondary | ICD-10-CM | POA: Diagnosis not present

## 2022-04-27 DIAGNOSIS — N1832 Chronic kidney disease, stage 3b: Secondary | ICD-10-CM | POA: Diagnosis not present

## 2022-04-27 DIAGNOSIS — K754 Autoimmune hepatitis: Secondary | ICD-10-CM | POA: Diagnosis not present

## 2022-04-27 DIAGNOSIS — R3129 Other microscopic hematuria: Secondary | ICD-10-CM | POA: Diagnosis not present

## 2022-05-06 ENCOUNTER — Telehealth: Payer: Self-pay | Admitting: *Deleted

## 2022-05-06 NOTE — Telephone Encounter (Signed)
Patient asked about lab results. He had done on 3/7. I called lab and they do have sample but its still in process. The [facility] lab rep I spoke with was going to send an email to see what was going on with the sample and then call and let me know.

## 2022-05-08 NOTE — Telephone Encounter (Signed)
Called [facility] back to follow up on results since I did not get a call back from them. Was told sample was processed on 3/21 and takes 4 -6 days to get results. I called and discussed with patient's wife and I will follow up next week to check for results.

## 2022-05-11 NOTE — Telephone Encounter (Signed)
Results still pending.

## 2022-05-13 LAB — THIOPURINE METHYLTRANSFERASE (TPMT), RBC: Thiopurine Methyltransferase, RBC: 16 nmol/hr/mL RBC

## 2022-05-14 NOTE — Telephone Encounter (Signed)
Pt had called last week for results and I had to call lab and have been watching for results. Results now available.

## 2022-05-17 DIAGNOSIS — I10 Essential (primary) hypertension: Secondary | ICD-10-CM | POA: Diagnosis not present

## 2022-05-17 DIAGNOSIS — N183 Chronic kidney disease, stage 3 unspecified: Secondary | ICD-10-CM | POA: Diagnosis not present

## 2022-05-17 DIAGNOSIS — E039 Hypothyroidism, unspecified: Secondary | ICD-10-CM | POA: Diagnosis not present

## 2022-05-18 ENCOUNTER — Other Ambulatory Visit (INDEPENDENT_AMBULATORY_CARE_PROVIDER_SITE_OTHER): Payer: Self-pay | Admitting: Gastroenterology

## 2022-05-18 MED ORDER — AZATHIOPRINE 50 MG PO TABS: 50.0000 mg | ORAL_TABLET | Freq: Every day | ORAL | 3 refills | Status: DC

## 2022-05-18 MED ORDER — PREDNISONE 10 MG PO TABS: ORAL_TABLET | ORAL | 0 refills | Status: AC

## 2022-06-15 ENCOUNTER — Other Ambulatory Visit (INDEPENDENT_AMBULATORY_CARE_PROVIDER_SITE_OTHER): Payer: Self-pay | Admitting: Gastroenterology

## 2022-06-15 ENCOUNTER — Other Ambulatory Visit (INDEPENDENT_AMBULATORY_CARE_PROVIDER_SITE_OTHER): Payer: Self-pay | Admitting: *Deleted

## 2022-06-15 ENCOUNTER — Telehealth (INDEPENDENT_AMBULATORY_CARE_PROVIDER_SITE_OTHER): Payer: Self-pay | Admitting: *Deleted

## 2022-06-15 DIAGNOSIS — Z6833 Body mass index (BMI) 33.0-33.9, adult: Secondary | ICD-10-CM | POA: Diagnosis not present

## 2022-06-15 DIAGNOSIS — K7460 Unspecified cirrhosis of liver: Secondary | ICD-10-CM

## 2022-06-15 DIAGNOSIS — N183 Chronic kidney disease, stage 3 unspecified: Secondary | ICD-10-CM | POA: Diagnosis not present

## 2022-06-15 DIAGNOSIS — E7849 Other hyperlipidemia: Secondary | ICD-10-CM | POA: Diagnosis not present

## 2022-06-15 DIAGNOSIS — K754 Autoimmune hepatitis: Secondary | ICD-10-CM

## 2022-06-15 DIAGNOSIS — E6609 Other obesity due to excess calories: Secondary | ICD-10-CM | POA: Diagnosis not present

## 2022-06-15 DIAGNOSIS — I10 Essential (primary) hypertension: Secondary | ICD-10-CM | POA: Diagnosis not present

## 2022-06-15 DIAGNOSIS — R972 Elevated prostate specific antigen [PSA]: Secondary | ICD-10-CM | POA: Diagnosis not present

## 2022-06-15 DIAGNOSIS — E559 Vitamin D deficiency, unspecified: Secondary | ICD-10-CM | POA: Diagnosis not present

## 2022-06-15 DIAGNOSIS — E039 Hypothyroidism, unspecified: Secondary | ICD-10-CM | POA: Diagnosis not present

## 2022-06-15 DIAGNOSIS — E782 Mixed hyperlipidemia: Secondary | ICD-10-CM | POA: Diagnosis not present

## 2022-06-15 MED ORDER — PREDNISONE 10 MG PO TABS: 10.0000 mg | ORAL_TABLET | Freq: Every day | ORAL | 2 refills | Status: DC

## 2022-06-15 NOTE — Telephone Encounter (Signed)
Patient needs refill on prednisone 10mg . Taking one daily currently. Note states to let you know when he needs refill. He uses [facility].   I put in order for cmp for him to do this week. He started azathioprine on 4/2 and needed labs one month after starting. Wife states he is doing so much better. Patient is aware to do labs.   [phone]

## 2022-06-15 NOTE — Telephone Encounter (Signed)
Left message to return call 

## 2022-06-15 NOTE — Telephone Encounter (Signed)
Pt notified on vm that rx requested was sent to pharmacy. Pt was aware from earlier phone call to do labs.

## 2022-06-16 LAB — COMPREHENSIVE METABOLIC PANEL
ALT: 29 IU/L (ref 0–44)
AST: 26 IU/L (ref 0–40)
Albumin/Globulin Ratio: 1.5 (ref 1.2–2.2)
Albumin: 3.8 g/dL — ABNORMAL LOW (ref 3.9–4.9)
Alkaline Phosphatase: 32 IU/L — ABNORMAL LOW (ref 44–121)
BUN/Creatinine Ratio: 12 (ref 10–24)
BUN: 24 mg/dL (ref 8–27)
Bilirubin Total: 0.7 mg/dL (ref 0.0–1.2)
CO2: 22 mmol/L (ref 20–29)
Calcium: 9.6 mg/dL (ref 8.6–10.2)
Chloride: 104 mmol/L (ref 96–106)
Creatinine, Ser: 1.94 mg/dL — ABNORMAL HIGH (ref 0.76–1.27)
Globulin, Total: 2.6 g/dL (ref 1.5–4.5)
Glucose: 82 mg/dL (ref 70–99)
Potassium: 4.5 mmol/L (ref 3.5–5.2)
Sodium: 140 mmol/L (ref 134–144)
Total Protein: 6.4 g/dL (ref 6.0–8.5)
eGFR: 37 mL/min/{1.73_m2} — ABNORMAL LOW (ref >59)

## 2022-06-24 ENCOUNTER — Encounter (INDEPENDENT_AMBULATORY_CARE_PROVIDER_SITE_OTHER): Payer: Self-pay | Admitting: Gastroenterology

## 2022-07-02 ENCOUNTER — Ambulatory Visit (HOSPITAL_COMMUNITY)
Admission: RE | Admit: 2022-07-02 | Discharge: 2022-07-02 | Disposition: A | Discharge status: Home / Self Care | Payer: Medicare HMO | Source: Ambulatory Visit | Attending: Gastroenterology | Admitting: Gastroenterology

## 2022-07-02 DIAGNOSIS — K7460 Unspecified cirrhosis of liver: Secondary | ICD-10-CM

## 2022-07-22 DIAGNOSIS — Z6833 Body mass index (BMI) 33.0-33.9, adult: Secondary | ICD-10-CM | POA: Diagnosis not present

## 2022-07-22 DIAGNOSIS — E6609 Other obesity due to excess calories: Secondary | ICD-10-CM | POA: Diagnosis not present

## 2022-07-22 DIAGNOSIS — E039 Hypothyroidism, unspecified: Secondary | ICD-10-CM | POA: Diagnosis not present

## 2022-07-22 DIAGNOSIS — Z1331 Encounter for screening for depression: Secondary | ICD-10-CM | POA: Diagnosis not present

## 2022-07-22 DIAGNOSIS — Z0001 Encounter for general adult medical examination with abnormal findings: Secondary | ICD-10-CM | POA: Diagnosis not present

## 2022-07-27 DIAGNOSIS — E785 Hyperlipidemia, unspecified: Secondary | ICD-10-CM | POA: Diagnosis not present

## 2022-07-27 DIAGNOSIS — K754 Autoimmune hepatitis: Secondary | ICD-10-CM | POA: Diagnosis not present

## 2022-07-27 DIAGNOSIS — R3129 Other microscopic hematuria: Secondary | ICD-10-CM | POA: Diagnosis not present

## 2022-07-27 DIAGNOSIS — N1832 Chronic kidney disease, stage 3b: Secondary | ICD-10-CM | POA: Diagnosis not present

## 2022-07-27 DIAGNOSIS — R809 Proteinuria, unspecified: Secondary | ICD-10-CM | POA: Diagnosis not present

## 2022-07-27 DIAGNOSIS — E669 Obesity, unspecified: Secondary | ICD-10-CM | POA: Diagnosis not present

## 2022-07-27 DIAGNOSIS — I129 Hypertensive chronic kidney disease with stage 1 through stage 4 chronic kidney disease, or unspecified chronic kidney disease: Secondary | ICD-10-CM | POA: Diagnosis not present

## 2022-08-11 ENCOUNTER — Telehealth (INDEPENDENT_AMBULATORY_CARE_PROVIDER_SITE_OTHER): Payer: Self-pay | Admitting: *Deleted

## 2022-08-11 ENCOUNTER — Other Ambulatory Visit (INDEPENDENT_AMBULATORY_CARE_PROVIDER_SITE_OTHER): Payer: Self-pay | Admitting: *Deleted

## 2022-08-11 DIAGNOSIS — K754 Autoimmune hepatitis: Secondary | ICD-10-CM

## 2022-08-11 DIAGNOSIS — K7460 Unspecified cirrhosis of liver: Secondary | ICD-10-CM

## 2022-08-11 NOTE — Telephone Encounter (Signed)
Pt's wife ( on dpr ) aware pt is due for cmp at [facility] end of June. She states she will tell him to go lab by end of month.

## 2022-08-12 DIAGNOSIS — K754 Autoimmune hepatitis: Secondary | ICD-10-CM | POA: Diagnosis not present

## 2022-08-12 DIAGNOSIS — K7460 Unspecified cirrhosis of liver: Secondary | ICD-10-CM | POA: Diagnosis not present

## 2022-08-13 LAB — COMPREHENSIVE METABOLIC PANEL
ALT: 18 IU/L (ref 0–44)
AST: 22 IU/L (ref 0–40)
Albumin: 3.8 g/dL — ABNORMAL LOW (ref 3.9–4.9)
Alkaline Phosphatase: 32 IU/L — ABNORMAL LOW (ref 44–121)
BUN/Creatinine Ratio: 11 (ref 10–24)
BUN: 22 mg/dL (ref 8–27)
Bilirubin Total: 0.4 mg/dL (ref 0.0–1.2)
CO2: 20 mmol/L (ref 20–29)
Calcium: 9.2 mg/dL (ref 8.6–10.2)
Chloride: 110 mmol/L — ABNORMAL HIGH (ref 96–106)
Creatinine, Ser: 2.08 mg/dL — ABNORMAL HIGH (ref 0.76–1.27)
Globulin, Total: 2.2 g/dL (ref 1.5–4.5)
Glucose: 91 mg/dL (ref 70–99)
Potassium: 5.4 mmol/L — ABNORMAL HIGH (ref 3.5–5.2)
Sodium: 142 mmol/L (ref 134–144)
Total Protein: 6 g/dL (ref 6.0–8.5)
eGFR: 34 mL/min/{1.73_m2} — ABNORMAL LOW (ref >59)

## 2022-08-17 NOTE — Telephone Encounter (Signed)
Pt's wife Kathie Rhodes called to see if he needs to continue both prednisone 10mg  daily and imuran 50mg  daily. If so he needs refill on imuran to [facility]  [phone]

## 2022-08-19 ENCOUNTER — Other Ambulatory Visit (INDEPENDENT_AMBULATORY_CARE_PROVIDER_SITE_OTHER): Payer: Self-pay | Admitting: Gastroenterology

## 2022-08-19 MED ORDER — PREDNISONE 2.5 MG PO TABS: 7.5000 mg | ORAL_TABLET | Freq: Every day | ORAL | 1 refills | Status: DC

## 2022-08-19 MED ORDER — AZATHIOPRINE 50 MG PO TABS: 50.0000 mg | ORAL_TABLET | Freq: Every day | ORAL | 1 refills | Status: DC

## 2022-09-07 ENCOUNTER — Other Ambulatory Visit (INDEPENDENT_AMBULATORY_CARE_PROVIDER_SITE_OTHER): Payer: Self-pay

## 2022-09-07 ENCOUNTER — Telehealth (INDEPENDENT_AMBULATORY_CARE_PROVIDER_SITE_OTHER): Payer: Self-pay

## 2022-09-07 DIAGNOSIS — K7460 Unspecified cirrhosis of liver: Secondary | ICD-10-CM

## 2022-09-07 DIAGNOSIS — K754 Autoimmune hepatitis: Secondary | ICD-10-CM

## 2022-09-07 MED ORDER — AZATHIOPRINE 50 MG PO TABS: 50.0000 mg | ORAL_TABLET | Freq: Every day | ORAL | 1 refills | Status: DC

## 2022-09-07 MED ORDER — PREDNISONE 2.5 MG PO TABS: 7.5000 mg | ORAL_TABLET | Freq: Every day | ORAL | 1 refills | Status: DC

## 2022-09-07 NOTE — Telephone Encounter (Signed)
I spoke with Harold Nicholson and she was made aware that Harold Nicholson spoke with them earlier and Harold Nicholson wanted the patient to continue Azathrioprine 50 mg per day and wanted patient to switch to 7.5 of the prednisone and have labs CBC,Cmp, IgG in October. I have sent the new rx to [facility] per Harold Nicholson's request. Harold Nicholson states understanding.

## 2022-09-07 NOTE — Telephone Encounter (Signed)
Patient wife Kathie Rhodes (on [place]) wants to know if patient needs to continue on Azathioprine 50 mg once per day and  Prednisone 10 mg once per day. If so they will need prescriptions sent to [facility]. (The med list does not match the amount patient states he is taking, but the note from 04/22/2022 says he is on the 10 mg prednisone).

## 2022-09-08 DIAGNOSIS — E039 Hypothyroidism, unspecified: Secondary | ICD-10-CM | POA: Diagnosis not present

## 2022-09-21 ENCOUNTER — Ambulatory Visit (INDEPENDENT_AMBULATORY_CARE_PROVIDER_SITE_OTHER): Payer: Medicare HMO | Admitting: Gastroenterology

## 2022-10-15 ENCOUNTER — Ambulatory Visit (INDEPENDENT_AMBULATORY_CARE_PROVIDER_SITE_OTHER): Payer: Medicare HMO | Admitting: Gastroenterology

## 2022-10-15 ENCOUNTER — Encounter (INDEPENDENT_AMBULATORY_CARE_PROVIDER_SITE_OTHER): Payer: Self-pay | Admitting: Gastroenterology

## 2022-10-15 VITALS — BP 169/97 | HR 120 | Temp 97.5°F | Ht 74.0 in | Wt 249.6 lb

## 2022-10-15 DIAGNOSIS — K7400 Hepatic fibrosis, unspecified: Secondary | ICD-10-CM

## 2022-10-15 DIAGNOSIS — K7460 Unspecified cirrhosis of liver: Secondary | ICD-10-CM

## 2022-10-15 DIAGNOSIS — K754 Autoimmune hepatitis: Secondary | ICD-10-CM | POA: Insufficient documentation

## 2022-10-15 NOTE — Progress Notes (Addendum)
Referring Provider: Assunta Found, MD Primary Care Physician:  Assunta Found, MD Primary GI Physician: Dr. Levon Hedger   Chief Complaint  Patient presents with   Follow-up    Patient here today for a follow up on cirrhosis. Patient denies any current issues. Patient is taking Bisoprolol with Hydrochlorothiazide 10-6.25 daily and prednisone 10 mg daily. Patient will need the bisoprolol and the prednisone refilled and sent to [facility] pharmacy   HPI:   Harold Nicholson is a 72 y.o. male with past medical history of arthritis, hiatal hernia, high cholesterol, HTN, Hypothyroidism, autoimmune hepatitis    Patient presenting today for follow up of autoimmune hepatitis and possible underlying cirrhosis  Last seen February 2024, at that time here for abdominal pain, findings of possible cirrhosis on recent CT imaging.   Recommended liver serologies, MELD, AFP, [place] ANA and ASMA positive, IgG/IgM normal AFP 5.2 in February  [place] of liver in may with cirrhotic morphology, no focal lesions  Recommended to undergo liver biopsy for further evaluation of elevation of autoimmune serologies  Liver biopsy on 04/09/22 with mildly active chronic hepatitis (Autoimmune hepatitis), mild to moderate fibrosis, stage 2-3, though given limited sampling, recommended to treat as cirrhosis with routine MELD labs, AFP and HCC screening  He was started on AZA 50mg  daily and prednisone 30mg  taper. Advised to continue with cirrhosis labs and HCC screening every 6 months.   CMP in April with ALk phos 32, AST 26, ALT 29, continued on AZA 50mg  daily and prednisone 10mg  daily   Alk phos in June was 32, AST 22 and ALT 18, pt advised to decrease prednisone to 7.5mg  daily and continued on AZA 50mg  daily, will repeat CBC, CMP and IgG again in September  Present: He notes he has not switched to prednisone 7.5mg  as he still had 10mg  left. He is taking AZA 50mg  daily. He notes he has not really gained any weight taking steroids.  Does go to the gym 3 days a week and works outside a lot. He denies any recent cough, sore throat, fever, chills or obvious infection. Blood sugars normal on recent CMPs.   No swelling in abdomen or legs. No episodes of confusion.   He has noticed higher BP since being on prednisone, PCP put him on HTN meds but he kept falling asleep so was taken off of this, though notably is on ziac which he has been on for years.   Denies rectal bleeding or melena.   Cirrhosis related questions: Episodes of confusion/disorientation:  no Taking diuretics?  None  Beta blockers?  None  Prior history of variceal banding? None  Prior episodes of SBP? None  Last liver imaging: 06/2022 as above, no obvious lesions or masses  Alcohol use: none   CT A/P with contrast: November 2023 cirrhosis. 1 cm hypodense focus in the right lobe of the liver (15/2) appears similar to prior CT, likely a cyst. There is mild biliary ductal dilatation post cholecystectomy. No retained calcified stone noted in the central CBD. (LFTs normal at this time) Last Colonoscopy:08/2021 Dr. Michelle Nasuti,  - The entire examined colon is normal on direct and   retroflexion views. - No specimens collected. - Hemorrhoids. Last Endoscopy:09/2010, gastritis, hiatal hernia    Past Medical History:  Diagnosis Date   Arthritis    Complication of anesthesia    pt stated he stopped breathing during surgery   Hiatal hernia    Hypercholesteremia    Hypertension    Hypothyroidism    Impaired  fasting glucose    Pure hyperglyceridemia    Thyroid disease    Hypothyroid     Past Surgical History:  Procedure Laterality Date   CHOLECYSTECTOMY     CHOLECYSTECTOMY N/A 05/01/2013   Procedure: LAPAROSCOPIC CHOLECYSTECTOMY;  Surgeon: Dalia Heading, MD;  Location: [facility];  Service: General;  Laterality: N/A;   COLONOSCOPY  10/07/2010   Procedure: COLONOSCOPY;  Surgeon: Dalia Heading;  Location: [facility];  Service: Gastroenterology;  Laterality:  N/A;   COLONOSCOPY WITH PROPOFOL N/A 09/02/2021   Procedure: COLONOSCOPY WITH PROPOFOL;  Surgeon: Lewie Chamber, DO;  Location: [facility];  Service: General;  Laterality: N/A;   ESOPHAGOGASTRODUODENOSCOPY  10/07/2010   Procedure: ESOPHAGOGASTRODUODENOSCOPY (EGD);  Surgeon: Dalia Heading;  Location: [facility];  Service: Gastroenterology;  Laterality: N/A;   EYE SURGERY     JOINT REPLACEMENT     Right knee   Left Knee Arthroscopy     LIVER BIOPSY N/A 05/01/2013   Procedure: LIVER BIOPSY;  Surgeon: Dalia Heading, MD;  Location: [facility];  Service: General;  Laterality: N/A;   right knee     X 2   TOTAL KNEE ARTHROPLASTY Left 09/17/2014   Procedure: TOTAL KNEE ARTHROPLASTY;  Surgeon: Dannielle Huh, MD;  Location: [facility];  Service: Orthopedics;  Laterality: Left;    Current Outpatient Medications  Medication Sig Dispense Refill   azaTHIOprine (IMURAN) 50 MG tablet Take 1 tablet (50 mg total) by mouth daily. 60 tablet 1   bisoprolol-hydrochlorothiazide (ZIAC) 10-6.25 MG per tablet Take 1 tablet by mouth in the morning.     Cholecalciferol (VITAMIN D) 50 MCG (2000 UT) tablet Take 2,000 Units by mouth in the morning.     fenofibrate 160 MG tablet Take 160 mg by mouth in the morning.     levothyroxine (SYNTHROID) 150 MCG tablet Take 150 mcg by mouth daily before breakfast.     loratadine (CLARITIN) 10 MG tablet Take 10 mg by mouth in the morning.     meclizine (ANTIVERT) 25 MG tablet Take 25 mg by mouth 3 (three) times daily as needed for dizziness.     Multiple Vitamins-Minerals (MENS 50+ MULTI VITAMIN/MIN PO) Take by mouth daily at 6 (six) AM.     omeprazole (PRILOSEC) 40 MG capsule Take 40 mg by mouth daily before breakfast.     potassium gluconate 595 (99 K) MG TABS tablet Take 595 mg by mouth in the morning.     predniSONE (DELTASONE) 2.5 MG tablet Take 3 tablets (7.5 mg total) by mouth daily with breakfast. 270 tablet 1   tadalafil (CIALIS) 5 MG tablet Take 5 mg by mouth every  Saturday. In the morning     No current facility-administered medications for this visit.    Allergies as of 10/15/2022   (No Known Allergies)    Family History  Problem Relation Age of Onset   Diabetes Mother    Hyperlipidemia Mother    Hypertension Mother    Hyperlipidemia Father    Hypertension Father    Diabetes Sister    Hearing loss Sister    Hyperlipidemia Sister    Hypertension Sister    Diabetes Brother    Hyperlipidemia Brother    Hypertension Brother     Social History   Socioeconomic History   Marital status: Married    Spouse name: Not on file   Number of children: Not on file   Years of education: Not on file   Highest education  level: Not on file  Occupational History   Not on file  Tobacco Use   Smoking status: Never    Passive exposure: Never   Smokeless tobacco: Current    Types: Snuff  Vaping Use   Vaping status: Never Used  Substance and Sexual Activity   Alcohol use: No    Comment: quit drinking 3 years ago   Drug use: No   Sexual activity: Yes    Birth control/protection: None  Other Topics Concern   Not on file  Social History Narrative   Not on file   Social Determinants of Health   Financial Resource Strain: Not on file  Food Insecurity: Not on file  Transportation Needs: Not on file  Physical Activity: Not on file  Stress: Not on file  Social Connections: Not on file    Review of systems General: negative for malaise, night sweats, fever, chills, weight loss Neck: Negative for lumps, goiter, pain and significant neck swelling Resp: Negative for cough, wheezing, dyspnea at rest CV: Negative for chest pain, leg swelling, palpitations, orthopnea GI: denies melena, hematochezia, nausea, vomiting, diarrhea, constipation, dysphagia, odyonophagia, early satiety or unintentional weight loss.  MSK: Negative for joint pain or swelling, back pain, and muscle pain. Derm: Negative for itching or rash Psych: Denies depression,  anxiety, memory loss, confusion. No homicidal or suicidal ideation.  Heme: Negative for prolonged bleeding, bruising easily, and swollen nodes. Endocrine: Negative for cold or heat intolerance, polyuria, polydipsia and goiter. Neuro: negative for tremor, gait imbalance, syncope and seizures. The remainder of the review of systems is noncontributory.  Physical Exam: BP (!) 169/97 (BP Location: Left Arm, Patient Position: Sitting, Cuff Size: Normal)   Pulse (!) 120   Temp (!) 97.5 F (36.4 C) (Temporal)   Ht 6\' 2"  (1.88 m)   Wt 249 lb 9.6 oz (113.2 kg)   BMI 32.05 kg/m  General:   Alert and oriented. No distress noted. Pleasant and cooperative.  Head:  Normocephalic and atraumatic. Eyes:  Conjuctiva clear without scleral icterus. Mouth:  Oral mucosa pink and moist. Good dentition. No lesions. Heart: Normal rate and rhythm, s1 and s2 heart sounds present.  Lungs: Clear lung sounds in all lobes. Respirations equal and unlabored. Abdomen:  +BS, soft, non-tender and non-distended. No rebound or guarding. No HSM or masses noted. Derm: No palmar erythema or jaundice Msk:  Symmetrical without gross deformities. Normal posture. Extremities:  Without edema. Neurologic:  Alert and  oriented x4 Psych:  Alert and cooperative. Normal mood and affect.  Invalid input(s): "6 MONTHS"   ASSESSMENT: Harold Nicholson is a 72 y.o. male presenting today for follow up of autoimmune hepatitis, confirmed via liver biopsy  AIH: IgG has remained normal throughout, repeat LFTs in June were normal, pt was advised to decrease prednisone to 7.5mg  though he misunderstood and has remained on 10mg  of prednisone and continued on AZA 50mg  daily. I again reiterated to him to decrease prednisone to 7.5mg  daily. We will repeat labs again next month. He has had no side effects of the medications and is tolerating them well.   Advanced liver fibrosis/question of cirrhosis: imaging of the liver has been consistent with  cirrhosis, though biopsy showed F2/F3 fibrosis, however given such small sampling in biopsy, it was recommended that patient have MELD labs, AFP and HCC screening every 6 months. He is due for AFP and INR next month and HCC screening via [place] in November. He has had no signs of HE, no ascites, and  remains well compensated. Last MELD 3.0 was 16. Plt count 176k in February 2024, therefore no indication for EV screening via EGD at this time.     PLAN:  - CBC/CMP/IgG in September  - start prednisone 7.5mg  daily, conitnue AZA 50mg  daily  - due for INR/AFP, can do these in September with other labs -next [facility] screening via [place] due in November -- Reduce salt intake to <2 g per day - Can take Tylenol max of 2 g per day (650 mg q8h) for pain - Avoid NSAIDs for pain - Avoid eating raw oysters/shellfish - Ensure every night before going to sleep  All questions were answered, patient verbalized understanding and is in agreement with plan as outlined above.   Follow Up: 6 months   Alaycia Eardley L. Jeanmarie Hubert, MSN, APRN, AGNP-C Adult-Gerontology Nurse Practitioner [facility]  I have reviewed the note and agree with the APP's assessment as described in this progress note  Katrinka Blazing, MD Gastroenterology and Hepatology [facility]

## 2022-10-15 NOTE — Patient Instructions (Addendum)
-  Please start prednisone 7.5mg  daily and continue with azathioprine 50mg  daily -We will repeat labs again in September ( please have these drawn around 9/26)  -You are up to date on Ultrasound of the liver until November -I will be in touch after repeat labs to discuss any dosage changes in your medications -Continue with routine exercise as you are doing - Reduce salt intake to <2 g per day - Can take Tylenol max of 2 g per day (650 mg q8h) for pain - Avoid NSAIDs for pain - Avoid eating raw oysters/shellfish - Ensure every night before going to sleep  Follow up 6 months  It was a pleasure to see you today. I want to create trusting relationships with patients and provide genuine, compassionate, and quality care. I truly value your feedback! please be on the lookout for a survey regarding your visit with me today. I appreciate your input about our visit and your time in completing this!    Deiondra Denley L. Jeanmarie Hubert, MSN, APRN, AGNP-C Adult-Gerontology Nurse Practitioner [facility] Gastroenterology at [facility]

## 2022-10-26 DIAGNOSIS — N1832 Chronic kidney disease, stage 3b: Secondary | ICD-10-CM | POA: Diagnosis not present

## 2022-10-26 DIAGNOSIS — I129 Hypertensive chronic kidney disease with stage 1 through stage 4 chronic kidney disease, or unspecified chronic kidney disease: Secondary | ICD-10-CM | POA: Diagnosis not present

## 2022-10-26 DIAGNOSIS — R809 Proteinuria, unspecified: Secondary | ICD-10-CM | POA: Diagnosis not present

## 2022-10-26 DIAGNOSIS — K754 Autoimmune hepatitis: Secondary | ICD-10-CM | POA: Diagnosis not present

## 2022-11-02 DIAGNOSIS — E039 Hypothyroidism, unspecified: Secondary | ICD-10-CM | POA: Diagnosis not present

## 2022-11-11 ENCOUNTER — Ambulatory Visit: Payer: Medicare HMO | Admitting: Orthopedic Surgery

## 2022-11-12 ENCOUNTER — Other Ambulatory Visit (INDEPENDENT_AMBULATORY_CARE_PROVIDER_SITE_OTHER): Payer: Self-pay

## 2022-11-12 ENCOUNTER — Ambulatory Visit: Payer: Medicare HMO | Admitting: Orthopedic Surgery

## 2022-11-12 DIAGNOSIS — M19071 Primary osteoarthritis, right ankle and foot: Secondary | ICD-10-CM | POA: Diagnosis not present

## 2022-11-12 DIAGNOSIS — K7460 Unspecified cirrhosis of liver: Secondary | ICD-10-CM | POA: Diagnosis not present

## 2022-11-12 DIAGNOSIS — K754 Autoimmune hepatitis: Secondary | ICD-10-CM | POA: Diagnosis not present

## 2022-11-12 NOTE — Progress Notes (Signed)
Annual follow-up  Harold Nicholson   Encounter Diagnosis  Name Primary?   Arthritis of right ankle Yes   He has arthritis in his right ankle he also has a calcaneal lipoma seen on plain film and evaluated with CT scan  His symptoms currently include occasional ankle pain but no significant loss of function except on unlevel ground  His exam shows he still has a very good range of motion in his ankle and subtalar joint is still flexible  X-rays today show no significant change in his ankle x-ray there is some narrowing on the lateral side but otherwise no significant change from last year  Recommendations are for him to wear his brace  X-ray in a year  Return sooner if things worsen

## 2022-11-13 LAB — CBC WITH DIFFERENTIAL/PLATELET
Basophils Absolute: 0 10*3/uL (ref 0.0–0.2)
Basos: 0 %
EOS (ABSOLUTE): 0.2 10*3/uL (ref 0.0–0.4)
Eos: 3 %
Hematocrit: 38.7 % (ref 37.5–51.0)
Hemoglobin: 13.1 g/dL (ref 13.0–17.7)
Immature Grans (Abs): 0.1 10*3/uL (ref 0.0–0.1)
Immature Granulocytes: 1 %
Lymphocytes Absolute: 1.7 10*3/uL (ref 0.7–3.1)
Lymphs: 21 %
MCH: 34.6 pg — ABNORMAL HIGH (ref 26.6–33.0)
MCHC: 33.9 g/dL (ref 31.5–35.7)
MCV: 102 fL — ABNORMAL HIGH (ref 79–97)
Monocytes Absolute: 0.6 10*3/uL (ref 0.1–0.9)
Monocytes: 7 %
Neutrophils Absolute: 5.5 10*3/uL (ref 1.4–7.0)
Neutrophils: 68 %
Platelets: 191 10*3/uL (ref 150–450)
RBC: 3.79 x10E6/uL — ABNORMAL LOW (ref 4.14–5.80)
RDW: 12.3 % (ref 11.6–15.4)
WBC: 8.1 10*3/uL (ref 3.4–10.8)

## 2022-11-13 LAB — COMPREHENSIVE METABOLIC PANEL
ALT: 12 [IU]/L (ref 0–44)
AST: 19 [IU]/L (ref 0–40)
Albumin: 3.7 g/dL — ABNORMAL LOW (ref 3.9–4.9)
Alkaline Phosphatase: 30 [IU]/L — ABNORMAL LOW (ref 44–121)
BUN/Creatinine Ratio: 11 (ref 10–24)
BUN: 21 mg/dL (ref 8–27)
Bilirubin Total: 0.5 mg/dL (ref 0.0–1.2)
CO2: 21 mmol/L (ref 20–29)
Calcium: 9.2 mg/dL (ref 8.6–10.2)
Chloride: 109 mmol/L — ABNORMAL HIGH (ref 96–106)
Creatinine, Ser: 1.84 mg/dL — ABNORMAL HIGH (ref 0.76–1.27)
Globulin, Total: 2.2 g/dL (ref 1.5–4.5)
Glucose: 98 mg/dL (ref 70–99)
Potassium: 4.4 mmol/L (ref 3.5–5.2)
Sodium: 142 mmol/L (ref 134–144)
Total Protein: 5.9 g/dL — ABNORMAL LOW (ref 6.0–8.5)
eGFR: 39 mL/min/{1.73_m2} — ABNORMAL LOW (ref >59)

## 2022-11-13 LAB — PROTIME-INR
INR: 1.1 (ref 0.9–1.2)
Prothrombin Time: 12.5 s — ABNORMAL HIGH (ref 9.1–12.0)

## 2022-11-13 LAB — IGG: IgG (Immunoglobin G), Serum: 841 mg/dL (ref 603–1613)

## 2022-11-13 LAB — AFP TUMOR MARKER: AFP, Serum, Tumor Marker: 3.9 ng/mL (ref 0.0–8.4)

## 2022-11-17 ENCOUNTER — Other Ambulatory Visit (INDEPENDENT_AMBULATORY_CARE_PROVIDER_SITE_OTHER): Payer: Self-pay | Admitting: *Deleted

## 2022-11-17 DIAGNOSIS — K7460 Unspecified cirrhosis of liver: Secondary | ICD-10-CM

## 2022-11-17 DIAGNOSIS — K754 Autoimmune hepatitis: Secondary | ICD-10-CM

## 2022-11-17 MED ORDER — AZATHIOPRINE 50 MG PO TABS
50.0000 mg | ORAL_TABLET | Freq: Every day | ORAL | 1 refills | Status: DC
Start: 2022-11-17 — End: 2023-02-02

## 2022-11-17 MED ORDER — PREDNISONE 2.5 MG PO TABS: 7.5000 mg | ORAL_TABLET | Freq: Every day | ORAL | 1 refills | Status: DC

## 2022-12-11 ENCOUNTER — Encounter (INDEPENDENT_AMBULATORY_CARE_PROVIDER_SITE_OTHER): Payer: Self-pay | Admitting: *Deleted

## 2022-12-17 ENCOUNTER — Telehealth (INDEPENDENT_AMBULATORY_CARE_PROVIDER_SITE_OTHER): Payer: Self-pay | Admitting: Gastroenterology

## 2022-12-17 DIAGNOSIS — K7460 Unspecified cirrhosis of liver: Secondary | ICD-10-CM

## 2022-12-17 NOTE — Telephone Encounter (Signed)
Pt wife left message in regards to scheduling [place]. Pt had [place] ABD LIMITED RUQ in May. Would you like me to order same [place]? Please advise. Thank you.

## 2022-12-21 NOTE — Telephone Encounter (Signed)
Pt wife left message to return call in regards to ultrasound. Returned call to patient and gave ultrasound appt. Pt wife verbalized understanding.

## 2022-12-21 NOTE — Addendum Note (Signed)
Addended by: Marlowe Shores on: 12/21/2022 10:16 AM   Modules accepted: Orders

## 2022-12-21 NOTE — Telephone Encounter (Signed)
[place] scheduled for 12/28/22 at 10:30am. Pt to arrive at 10:15am.NPO after midnight. Left message on wife voicemail to return call

## 2022-12-28 ENCOUNTER — Ambulatory Visit (HOSPITAL_COMMUNITY)
Admission: RE | Admit: 2022-12-28 | Discharge: 2022-12-28 | Disposition: A | Discharge status: Home / Self Care | Payer: Medicare HMO | Source: Ambulatory Visit | Attending: Gastroenterology | Admitting: Gastroenterology

## 2022-12-28 DIAGNOSIS — K7689 Other specified diseases of liver: Secondary | ICD-10-CM | POA: Diagnosis not present

## 2022-12-28 DIAGNOSIS — Z9049 Acquired absence of other specified parts of digestive tract: Secondary | ICD-10-CM | POA: Diagnosis not present

## 2022-12-28 DIAGNOSIS — K7460 Unspecified cirrhosis of liver: Secondary | ICD-10-CM | POA: Insufficient documentation

## 2023-01-04 DIAGNOSIS — E039 Hypothyroidism, unspecified: Secondary | ICD-10-CM | POA: Diagnosis not present

## 2023-01-25 DIAGNOSIS — I129 Hypertensive chronic kidney disease with stage 1 through stage 4 chronic kidney disease, or unspecified chronic kidney disease: Secondary | ICD-10-CM | POA: Diagnosis not present

## 2023-01-25 DIAGNOSIS — R3129 Other microscopic hematuria: Secondary | ICD-10-CM | POA: Diagnosis not present

## 2023-01-25 DIAGNOSIS — R809 Proteinuria, unspecified: Secondary | ICD-10-CM | POA: Diagnosis not present

## 2023-01-25 DIAGNOSIS — K754 Autoimmune hepatitis: Secondary | ICD-10-CM | POA: Diagnosis not present

## 2023-01-25 DIAGNOSIS — N1832 Chronic kidney disease, stage 3b: Secondary | ICD-10-CM | POA: Diagnosis not present

## 2023-02-01 DIAGNOSIS — R809 Proteinuria, unspecified: Secondary | ICD-10-CM | POA: Diagnosis not present

## 2023-02-01 DIAGNOSIS — R3129 Other microscopic hematuria: Secondary | ICD-10-CM | POA: Diagnosis not present

## 2023-02-01 DIAGNOSIS — N1832 Chronic kidney disease, stage 3b: Secondary | ICD-10-CM | POA: Diagnosis not present

## 2023-02-02 ENCOUNTER — Other Ambulatory Visit (INDEPENDENT_AMBULATORY_CARE_PROVIDER_SITE_OTHER): Payer: Self-pay | Admitting: Gastroenterology

## 2023-02-02 DIAGNOSIS — K7460 Unspecified cirrhosis of liver: Secondary | ICD-10-CM

## 2023-02-02 DIAGNOSIS — K754 Autoimmune hepatitis: Secondary | ICD-10-CM

## 2023-02-02 NOTE — Telephone Encounter (Signed)
 Last ov: 10/15/22

## 2023-02-16 ENCOUNTER — Telehealth (INDEPENDENT_AMBULATORY_CARE_PROVIDER_SITE_OTHER): Payer: Self-pay | Admitting: *Deleted

## 2023-02-16 ENCOUNTER — Other Ambulatory Visit (INDEPENDENT_AMBULATORY_CARE_PROVIDER_SITE_OTHER): Payer: Self-pay | Admitting: *Deleted

## 2023-02-16 DIAGNOSIS — K754 Autoimmune hepatitis: Secondary | ICD-10-CM

## 2023-02-16 DIAGNOSIS — K7460 Unspecified cirrhosis of liver: Secondary | ICD-10-CM

## 2023-02-16 NOTE — Telephone Encounter (Signed)
Pt in reminder file for repeat labs CMP and IgG again in 3 months per [place] - due end of December 2024  Pt's wife called and notified lab orders are in for [facility] and may go anytime now. She said he would go Thursday.

## 2023-02-18 DIAGNOSIS — E559 Vitamin D deficiency, unspecified: Secondary | ICD-10-CM | POA: Diagnosis not present

## 2023-02-18 DIAGNOSIS — N183 Chronic kidney disease, stage 3 unspecified: Secondary | ICD-10-CM | POA: Diagnosis not present

## 2023-02-18 DIAGNOSIS — E782 Mixed hyperlipidemia: Secondary | ICD-10-CM | POA: Diagnosis not present

## 2023-02-18 DIAGNOSIS — I10 Essential (primary) hypertension: Secondary | ICD-10-CM | POA: Diagnosis not present

## 2023-02-18 DIAGNOSIS — K7460 Unspecified cirrhosis of liver: Secondary | ICD-10-CM | POA: Diagnosis not present

## 2023-02-18 DIAGNOSIS — E039 Hypothyroidism, unspecified: Secondary | ICD-10-CM | POA: Diagnosis not present

## 2023-02-18 DIAGNOSIS — K754 Autoimmune hepatitis: Secondary | ICD-10-CM | POA: Diagnosis not present

## 2023-02-19 LAB — COMPREHENSIVE METABOLIC PANEL
ALT: 14 [IU]/L (ref 0–44)
AST: 19 [IU]/L (ref 0–40)
Albumin: 4 g/dL (ref 3.9–4.9)
Alkaline Phosphatase: 33 [IU]/L — ABNORMAL LOW (ref 44–121)
BUN/Creatinine Ratio: 11 (ref 10–24)
BUN: 24 mg/dL (ref 8–27)
Bilirubin Total: 0.6 mg/dL (ref 0.0–1.2)
CO2: 22 mmol/L (ref 20–29)
Calcium: 9.5 mg/dL (ref 8.6–10.2)
Chloride: 109 mmol/L — ABNORMAL HIGH (ref 96–106)
Creatinine, Ser: 2.15 mg/dL — ABNORMAL HIGH (ref 0.76–1.27)
Globulin, Total: 2.2 g/dL (ref 1.5–4.5)
Glucose: 93 mg/dL (ref 70–99)
Potassium: 4.6 mmol/L (ref 3.5–5.2)
Sodium: 144 mmol/L (ref 134–144)
Total Protein: 6.2 g/dL (ref 6.0–8.5)
eGFR: 32 mL/min/{1.73_m2} — ABNORMAL LOW (ref >59)

## 2023-02-19 LAB — IGG: IgG (Immunoglobin G), Serum: 919 mg/dL (ref 603–1613)

## 2023-04-13 ENCOUNTER — Ambulatory Visit (INDEPENDENT_AMBULATORY_CARE_PROVIDER_SITE_OTHER): Payer: Medicare Other | Admitting: Gastroenterology

## 2023-04-13 ENCOUNTER — Encounter (INDEPENDENT_AMBULATORY_CARE_PROVIDER_SITE_OTHER): Payer: Self-pay | Admitting: Gastroenterology

## 2023-04-13 ENCOUNTER — Encounter (INDEPENDENT_AMBULATORY_CARE_PROVIDER_SITE_OTHER): Payer: Self-pay

## 2023-04-13 DIAGNOSIS — K754 Autoimmune hepatitis: Secondary | ICD-10-CM

## 2023-04-13 DIAGNOSIS — K7400 Hepatic fibrosis, unspecified: Secondary | ICD-10-CM

## 2023-04-13 DIAGNOSIS — K7460 Unspecified cirrhosis of liver: Secondary | ICD-10-CM

## 2023-04-13 MED ORDER — PREDNISONE 2.5 MG PO TABS: 5.0000 mg | ORAL_TABLET | Freq: Every day | ORAL | 1 refills | Status: DC

## 2023-04-13 MED ORDER — AZATHIOPRINE 50 MG PO TABS: 50.0000 mg | ORAL_TABLET | Freq: Every day | ORAL | 1 refills | Status: DC

## 2023-04-13 NOTE — Patient Instructions (Addendum)
-  Continue imuran 50mg  daily -Decrease prednisone to 5mg  daily -we will check  CBC, INR, AFP now  -Repeat CMP, IgG  in April-will call to remind you to do these then  -Due for [place] of liver May 2025 - Reduce salt intake to <2 g per day - Can take Tylenol max of 2 g per day (650 mg q8h) for pain - Avoid NSAIDs for pain - Avoid eating raw oysters/shellfish - Ensure every night before going to sleep  Follow up 6 months  It was a pleasure to see you today. I want to create trusting relationships with patients and provide genuine, compassionate, and quality care. I truly value your feedback! please be on the lookout for a survey regarding your visit with me today. I appreciate your input about our visit and your time in completing this!    Ricarda Atayde L. Jeanmarie Hubert, MSN, APRN, AGNP-C Adult-Gerontology Nurse Practitioner [facility] Gastroenterology at [facility]

## 2023-04-13 NOTE — Progress Notes (Addendum)
 Referring Provider: Assunta Found, MD Primary Care Physician:  Assunta Found, MD Primary GI Physician: Dr. Levon Hedger   Chief Complaint  Patient presents with   Follow-up    Patient here today for a follow up on Autoimmune Hepatitis and cirrhosis of liver without Ascites. Patient says he has been having a lot of bruising as of late. He is not taking any blood thinners or Asprin.    HPI:   Harold Nicholson is a 73 y.o. male with past medical history of  arthritis, hiatal hernia, high cholesterol, HTN, Hypothyroidism, autoimmune hepatitis, advanced liver fibrosis/?cirrhosis   Patient presenting today for follow up of advanced liver fibrosis/possible Cirrhosis and autoimmune hepattiis  Last seen August 2024, at that time patient was still on prednisone 10 mg daily, had not decreased dose as previously recommended . Still taking AZA 50mg  daily. Trying to exercise a few times per week. Denied any GI issues.  Recommended to start prednisone 7.5mg  daily, repeat labs in September, continue AZA 50mg  daily, INR and AFP in September, [facility] screening due November.   Labs in September with IgG 841, normal LFTs, advised to continue pred 7.5mg  daily, AZA 50mg  daily, repeat CMP 3 months  IgG in January 2025 was 919, LFTs remain WNL, advised to continue with pred 7.5mg  daily, continue AZA 50mg  daily   Present: Patient reports he has had more sweating recently as well as bruising more easily if he bumps himself. He cannot pinpoint when this began but thinks after he started prednisone and AZA 50mg  daily. He denies swelling to his abdomen. No jaundice or pruritus. He denies episodes of confusion. No rectal bleeding or melena. Denies any acute infections or fevers. No red flag symptoms. Patient denies melena, hematochezia, nausea, vomiting, diarrhea, constipation, dysphagia, odyonophagia, early satiety or weight loss.    Last MELD 3.0 14 02/2023  Cirrhosis related questions: Episodes of  confusion/disorientation:  no Taking diuretics?  None  Beta blockers?  None  Prior history of variceal banding? None  Prior episodes of SBP? None  Last liver imaging: 12/2022 cirrhotic morphology, no lesions  Last AFP 10/2022 3.9  Alcohol use: none     Pertinent history: 12/2021 findings of possible cirrhosis on  CT imaging.    ANA and ASMA positive, IgG/IgM normal [place] of liver in may 2024 with cirrhotic morphology, no focal lesions   Liver biopsy on 04/09/22 with mildly active chronic hepatitis (Autoimmune hepatitis), mild to moderate fibrosis, stage 2-3, though given limited sampling, recommended to treat as cirrhosis with routine MELD labs, AFP and HCC screening   started on AZA 50mg  daily and prednisone 30mg  taper. Advised to continue with cirrhosis labs and HCC screening every 6 months.    CMP in April 2024 with ALk phos 32, AST 26, ALT 29, continued on AZA 50mg  daily and prednisone 10mg  daily    Alk phos in June was 32, AST 22 and ALT 18, pt advised to decrease prednisone to 7.5mg  daily and continued on AZA 50mg  daily   CT A/P with contrast: November 2023 cirrhosis. 1 cm hypodense focus in the right lobe of the liver (15/2) appears similar to prior CT, likely a cyst. There is mild biliary ductal dilatation post cholecystectomy. No retained calcified stone noted in the central CBD. (LFTs normal at this time) Last Colonoscopy:08/2021 Dr. Robyne Peers  - The entire examined colon is normal on direct and   retroflexion views. - No specimens collected. - Hemorrhoids. Last Endoscopy:09/2010, gastritis, hiatal hernia   Past Medical History:  Diagnosis Date   Arthritis    Complication of anesthesia    pt stated he stopped breathing during surgery   Hiatal hernia    Hypercholesteremia    Hypertension    Hypothyroidism    Impaired fasting glucose    Pure hyperglyceridemia    Thyroid disease    Hypothyroid     Past Surgical History:  Procedure Laterality Date   CHOLECYSTECTOMY      CHOLECYSTECTOMY N/A 05/01/2013   Procedure: LAPAROSCOPIC CHOLECYSTECTOMY;  Surgeon: Dalia Heading, MD;  Location: [facility];  Service: General;  Laterality: N/A;   COLONOSCOPY  10/07/2010   Procedure: COLONOSCOPY;  Surgeon: Dalia Heading;  [facility]: [facility];  Service: Gastroenterology;  Laterality: N/A;   COLONOSCOPY WITH PROPOFOL N/A 09/02/2021   Procedure: COLONOSCOPY WITH PROPOFOL;  Surgeon: Lewie Chamber, DO;  Location: [facility];  Service: General;  Laterality: N/A;   ESOPHAGOGASTRODUODENOSCOPY  10/07/2010   Procedure: ESOPHAGOGASTRODUODENOSCOPY (EGD);  Surgeon: Dalia Heading;  [facility]: [facility];  Service: Gastroenterology;  Laterality: N/A;   EYE SURGERY     JOINT REPLACEMENT     Right knee   Left Knee Arthroscopy     LIVER BIOPSY N/A 05/01/2013   Procedure: LIVER BIOPSY;  Surgeon: Dalia Heading, MD;  Location: [facility];  Service: General;  Laterality: N/A;   right knee     X 2   TOTAL KNEE ARTHROPLASTY Left 09/17/2014   Procedure: TOTAL KNEE ARTHROPLASTY;  Surgeon: Dannielle Huh, MD;  Location: [facility];  Service: Orthopedics;  Laterality: Left;    Current Outpatient Medications  Medication Sig Dispense Refill   azaTHIOprine (IMURAN) 50 MG tablet TAKE 1 TABLET EVERY DAY 90 tablet 3   bisoprolol-hydrochlorothiazide (ZIAC) 10-6.25 MG per tablet Take 1 tablet by mouth in the morning.     Cholecalciferol (VITAMIN D) 50 MCG (2000 UT) tablet Take 2,000 Units by mouth in the morning.     fenofibrate 160 MG tablet Take 160 mg by mouth in the morning.     levothyroxine (SYNTHROID) 150 MCG tablet Take 150 mcg by mouth daily before breakfast.     loratadine (CLARITIN) 10 MG tablet Take 10 mg by mouth in the morning.     meclizine (ANTIVERT) 25 MG tablet Take 25 mg by mouth 3 (three) times daily as needed for dizziness.     Multiple Vitamins-Minerals (MENS 50+ MULTI VITAMIN/MIN PO) Take by mouth daily at 6 (six) AM.     omeprazole (PRILOSEC) 40 MG capsule Take 40 mg by mouth  daily before breakfast.     potassium gluconate 595 (99 K) MG TABS tablet Take 595 mg by mouth in the morning.     predniSONE (DELTASONE) 2.5 MG tablet Take 3 tablets (7.5 mg total) by mouth daily with breakfast. 270 tablet 1   tadalafil (CIALIS) 5 MG tablet Take 5 mg by mouth every Saturday. In the morning     No current facility-administered medications for this visit.    Allergies as of 04/13/2023   (No Known Allergies)    Family History  Problem Relation Age of Onset   Diabetes Mother    Hyperlipidemia Mother    Hypertension Mother    Hyperlipidemia Father    Hypertension Father    Diabetes Sister    Hearing loss Sister    Hyperlipidemia Sister    Hypertension Sister    Diabetes Brother    Hyperlipidemia Brother    Hypertension Brother     Social History  Socioeconomic History   Marital status: Married    Spouse name: Not on file   Number of children: Not on file   Years of education: Not on file   Highest education level: Not on file  Occupational History   Not on file  Tobacco Use   Smoking status: Never    Passive exposure: Never   Smokeless tobacco: Current    Types: Snuff  Vaping Use   Vaping status: Never Used  Substance and Sexual Activity   Alcohol use: No    Comment: quit drinking 3 years ago   Drug use: No   Sexual activity: Yes    Birth control/protection: None  Other Topics Concern   Not on file  Social History Narrative   Not on file   Social Drivers of Health   Financial Resource Strain: Not on file  Food Insecurity: Not on file  Transportation Needs: Not on file  Physical Activity: Not on file  Stress: Not on file  Social Connections: Not on file    Review of systems General: negative for malaise, night sweats, fever, chills, weight loss Neck: Negative for lumps, goiter, pain and significant neck swelling Resp: Negative for cough, wheezing, dyspnea at rest CV: Negative for chest pain, leg swelling, palpitations, orthopnea GI:  denies melena, hematochezia, nausea, vomiting, diarrhea, constipation, dysphagia, odyonophagia, early satiety or unintentional weight loss.  MSK: Negative for joint pain or swelling, back pain, and muscle pain. Derm: Negative for itching or rash, +bruising Psych: Denies depression, anxiety, memory loss, confusion. No homicidal or suicidal ideation.  Heme: Negative for prolonged bleeding, and swollen nodes. +bruising easily Endocrine: Negative for cold or heat intolerance, polyuria, polydipsia and goiter. Neuro: negative for tremor, gait imbalance, syncope and seizures. The remainder of the review of systems is noncontributory.  Physical Exam: BP (!) 179/93 (BP Location: Left Arm, Patient Position: Sitting, Cuff Size: Large)   Pulse 73   Temp 98.4 F (36.9 C) (Temporal)   Ht 6\' 2"  (1.88 m)   Wt 256 lb 12.8 oz (116.5 kg)   BMI 32.97 kg/m  General:   Alert and oriented. No distress noted. Pleasant and cooperative.  Head:  Normocephalic and atraumatic. Eyes:  Conjuctiva clear without scleral icterus. Mouth:  Oral mucosa pink and moist. Good dentition. No lesions. Heart: Normal rate and rhythm, s1 and s2 heart sounds present.  Lungs: Clear lung sounds in all lobes. Respirations equal and unlabored. Abdomen:  +BS, soft, non-tender and non-distended. No rebound or guarding. No HSM or masses noted. Derm: No palmar erythema or jaundice, diffuse bruising to arms  Extremities:  Without edema. Neurologic:  Alert and  oriented x4 Psych:  Alert and cooperative. Normal mood and affect.  Invalid input(s): "6 MONTHS"   ASSESSMENT: Harold Nicholson is a 73 y.o. male presenting today for folllow up of AIH and advanced liver fibrosis/cirrhosis  AIH: IgG has remained normal throughout, was advised at visit prior to last to decrease prednisone to 7.5mg  daily though he misunderstood and remained on 10mg , was advised again at last OV to decrease to 7.5mg  which he has remained on as well as AZA 50mg  daily.  Most recent labs in January with normal LFTs and IgG. Will decrease prednisone to 5mg  today and repeat labs again in APril. Patient does note more sweating and easy bruising recently, last plt count and INR In September were WNL. Will repeat CMP and INR to evaluate platelet count and INR especially given Imuran/cirrhosis can precipitate thrombocytopenia, prednisone can also  cause easy bruising which I did discuss with the patient. Patient denies any infections or fevers at home.   Advanced liver fibrosis/question of cirrhosis: imaging of the liver has been consistent with cirrhosis, though biopsy showed F2/F3 fibrosis, however given such small sampling in biopsy, it was recommended that patient have MELD labs, AFP and HCC screening every 6 months. He is due for CBC, AFP and INR next month, though as above, given more bruising, will go ahead and update CBC, INR and will update AFP along with these. He has had no signs of HE, no ascites, and remains well compensated. Last MELD 3.0 was 14. Will need to discuss EGD for EV screening if plt count is <150k.    PLAN:  -Continue AZA 50mg  daily -Decrease prednisone to 5mg  daily - CBC, INR, AFP now  -Repeat CMP, IgG  in April-place in reminder for these  -HCC screening until May 2025 -Consider EGD if plt count <150 for EV screening - Reduce salt intake to <2 g per day - Can take Tylenol max of 2 g per day (650 mg q8h) for pain - Avoid NSAIDs for pain - Avoid eating raw oysters/shellfish - Ensure every night before going to sleep  All questions were answered, patient verbalized understanding and is in agreement with plan as outlined above.   Follow Up: 6 months   Sydni Elizarraraz L. Jeanmarie Hubert, MSN, APRN, AGNP-C Adult-Gerontology Nurse Practitioner [facility]  I have reviewed the note and agree with the APP's assessment as described in this progress note  If persistently elevated MELD above 15, may consider evaluation for liver transplant  at tertiary center  Katrinka Blazing, MD Gastroenterology and Hepatology [facility]

## 2023-04-14 LAB — CBC WITH DIFFERENTIAL/PLATELET
Basophils Absolute: 0 10*3/uL (ref 0.0–0.2)
Basos: 0 %
EOS (ABSOLUTE): 0.2 10*3/uL (ref 0.0–0.4)
Eos: 2 %
Hematocrit: 39.3 % (ref 37.5–51.0)
Hemoglobin: 12.9 g/dL — ABNORMAL LOW (ref 13.0–17.7)
Immature Grans (Abs): 0.1 10*3/uL (ref 0.0–0.1)
Immature Granulocytes: 1 %
Lymphocytes Absolute: 1 10*3/uL (ref 0.7–3.1)
Lymphs: 12 %
MCH: 34 pg — ABNORMAL HIGH (ref 26.6–33.0)
MCHC: 32.8 g/dL (ref 31.5–35.7)
MCV: 104 fL — ABNORMAL HIGH (ref 79–97)
Monocytes Absolute: 0.4 10*3/uL (ref 0.1–0.9)
Monocytes: 5 %
Neutrophils Absolute: 6.3 10*3/uL (ref 1.4–7.0)
Neutrophils: 80 %
Platelets: 160 10*3/uL (ref 150–450)
RBC: 3.79 x10E6/uL — ABNORMAL LOW (ref 4.14–5.80)
RDW: 12.4 % (ref 11.6–15.4)
WBC: 7.9 10*3/uL (ref 3.4–10.8)

## 2023-04-14 LAB — PROTIME-INR
INR: 1.1 (ref 0.9–1.2)
Prothrombin Time: 12.5 s — ABNORMAL HIGH (ref 9.1–12.0)

## 2023-04-14 LAB — AFP TUMOR MARKER: AFP, Serum, Tumor Marker: 3.8 ng/mL (ref 0.0–8.4)

## 2023-05-24 DIAGNOSIS — N1832 Chronic kidney disease, stage 3b: Secondary | ICD-10-CM | POA: Diagnosis not present

## 2023-05-31 DIAGNOSIS — N1832 Chronic kidney disease, stage 3b: Secondary | ICD-10-CM | POA: Diagnosis not present

## 2023-05-31 DIAGNOSIS — R3129 Other microscopic hematuria: Secondary | ICD-10-CM | POA: Diagnosis not present

## 2023-05-31 DIAGNOSIS — R809 Proteinuria, unspecified: Secondary | ICD-10-CM | POA: Diagnosis not present

## 2023-05-31 DIAGNOSIS — K754 Autoimmune hepatitis: Secondary | ICD-10-CM | POA: Diagnosis not present

## 2023-05-31 DIAGNOSIS — I129 Hypertensive chronic kidney disease with stage 1 through stage 4 chronic kidney disease, or unspecified chronic kidney disease: Secondary | ICD-10-CM | POA: Diagnosis not present

## 2023-06-07 DIAGNOSIS — N1832 Chronic kidney disease, stage 3b: Secondary | ICD-10-CM | POA: Diagnosis not present

## 2023-06-21 ENCOUNTER — Other Ambulatory Visit (INDEPENDENT_AMBULATORY_CARE_PROVIDER_SITE_OTHER): Payer: Self-pay | Admitting: Gastroenterology

## 2023-06-21 DIAGNOSIS — K754 Autoimmune hepatitis: Secondary | ICD-10-CM

## 2023-06-21 DIAGNOSIS — K7460 Unspecified cirrhosis of liver: Secondary | ICD-10-CM

## 2023-06-28 ENCOUNTER — Ambulatory Visit (HOSPITAL_COMMUNITY)
Admission: RE | Admit: 2023-06-28 | Discharge: 2023-06-28 | Disposition: A | Discharge status: Home / Self Care | Payer: Medicare Other | Source: Ambulatory Visit | Attending: Gastroenterology | Admitting: Gastroenterology

## 2023-06-28 DIAGNOSIS — K7460 Unspecified cirrhosis of liver: Secondary | ICD-10-CM | POA: Insufficient documentation

## 2023-07-23 DIAGNOSIS — I10 Essential (primary) hypertension: Secondary | ICD-10-CM | POA: Diagnosis not present

## 2023-07-23 DIAGNOSIS — E7849 Other hyperlipidemia: Secondary | ICD-10-CM | POA: Diagnosis not present

## 2023-07-23 DIAGNOSIS — E559 Vitamin D deficiency, unspecified: Secondary | ICD-10-CM | POA: Diagnosis not present

## 2023-07-23 DIAGNOSIS — E782 Mixed hyperlipidemia: Secondary | ICD-10-CM | POA: Diagnosis not present

## 2023-07-23 DIAGNOSIS — N183 Chronic kidney disease, stage 3 unspecified: Secondary | ICD-10-CM | POA: Diagnosis not present

## 2023-07-23 DIAGNOSIS — R7309 Other abnormal glucose: Secondary | ICD-10-CM | POA: Diagnosis not present

## 2023-07-23 DIAGNOSIS — Z0001 Encounter for general adult medical examination with abnormal findings: Secondary | ICD-10-CM | POA: Diagnosis not present

## 2023-07-23 DIAGNOSIS — E039 Hypothyroidism, unspecified: Secondary | ICD-10-CM | POA: Diagnosis not present

## 2023-08-23 DIAGNOSIS — T148XXA Other injury of unspecified body region, initial encounter: Secondary | ICD-10-CM | POA: Diagnosis not present

## 2023-08-23 DIAGNOSIS — Z96652 Presence of left artificial knee joint: Secondary | ICD-10-CM | POA: Diagnosis not present

## 2023-08-27 DIAGNOSIS — I10 Essential (primary) hypertension: Secondary | ICD-10-CM | POA: Diagnosis not present

## 2023-08-27 DIAGNOSIS — N1831 Chronic kidney disease, stage 3a: Secondary | ICD-10-CM | POA: Diagnosis not present

## 2023-08-27 DIAGNOSIS — K7460 Unspecified cirrhosis of liver: Secondary | ICD-10-CM | POA: Diagnosis not present

## 2023-08-27 DIAGNOSIS — Z7689 Persons encountering health services in other specified circumstances: Secondary | ICD-10-CM | POA: Diagnosis not present

## 2023-08-27 DIAGNOSIS — R42 Dizziness and giddiness: Secondary | ICD-10-CM | POA: Diagnosis not present

## 2023-08-27 DIAGNOSIS — E785 Hyperlipidemia, unspecified: Secondary | ICD-10-CM | POA: Insufficient documentation

## 2023-08-27 DIAGNOSIS — I700 Atherosclerosis of aorta: Secondary | ICD-10-CM | POA: Diagnosis not present

## 2023-08-27 DIAGNOSIS — K219 Gastro-esophageal reflux disease without esophagitis: Secondary | ICD-10-CM | POA: Diagnosis not present

## 2023-08-27 DIAGNOSIS — E039 Hypothyroidism, unspecified: Secondary | ICD-10-CM | POA: Diagnosis not present

## 2023-08-27 DIAGNOSIS — K754 Autoimmune hepatitis: Secondary | ICD-10-CM | POA: Diagnosis not present

## 2023-08-27 DIAGNOSIS — I129 Hypertensive chronic kidney disease with stage 1 through stage 4 chronic kidney disease, or unspecified chronic kidney disease: Secondary | ICD-10-CM | POA: Diagnosis not present

## 2023-08-27 DIAGNOSIS — M25571 Pain in right ankle and joints of right foot: Secondary | ICD-10-CM | POA: Diagnosis not present

## 2023-09-13 DIAGNOSIS — Z96652 Presence of left artificial knee joint: Secondary | ICD-10-CM | POA: Diagnosis not present

## 2023-09-13 DIAGNOSIS — T148XXA Other injury of unspecified body region, initial encounter: Secondary | ICD-10-CM | POA: Diagnosis not present

## 2023-09-16 DIAGNOSIS — I10 Essential (primary) hypertension: Secondary | ICD-10-CM | POA: Diagnosis not present

## 2023-09-16 DIAGNOSIS — E039 Hypothyroidism, unspecified: Secondary | ICD-10-CM | POA: Diagnosis not present

## 2023-09-20 DIAGNOSIS — N1832 Chronic kidney disease, stage 3b: Secondary | ICD-10-CM | POA: Diagnosis not present

## 2023-09-22 DIAGNOSIS — I10 Essential (primary) hypertension: Secondary | ICD-10-CM | POA: Diagnosis not present

## 2023-09-22 DIAGNOSIS — N1831 Chronic kidney disease, stage 3a: Secondary | ICD-10-CM | POA: Diagnosis not present

## 2023-09-22 DIAGNOSIS — E785 Hyperlipidemia, unspecified: Secondary | ICD-10-CM | POA: Diagnosis not present

## 2023-09-22 DIAGNOSIS — M25562 Pain in left knee: Secondary | ICD-10-CM | POA: Diagnosis not present

## 2023-09-22 DIAGNOSIS — K754 Autoimmune hepatitis: Secondary | ICD-10-CM | POA: Diagnosis not present

## 2023-09-22 DIAGNOSIS — K7460 Unspecified cirrhosis of liver: Secondary | ICD-10-CM | POA: Diagnosis not present

## 2023-09-22 DIAGNOSIS — K219 Gastro-esophageal reflux disease without esophagitis: Secondary | ICD-10-CM | POA: Diagnosis not present

## 2023-09-22 DIAGNOSIS — R42 Dizziness and giddiness: Secondary | ICD-10-CM | POA: Diagnosis not present

## 2023-09-22 DIAGNOSIS — E039 Hypothyroidism, unspecified: Secondary | ICD-10-CM | POA: Diagnosis not present

## 2023-09-22 DIAGNOSIS — R718 Other abnormality of red blood cells: Secondary | ICD-10-CM | POA: Diagnosis not present

## 2023-09-22 DIAGNOSIS — M25571 Pain in right ankle and joints of right foot: Secondary | ICD-10-CM | POA: Diagnosis not present

## 2023-09-22 DIAGNOSIS — I700 Atherosclerosis of aorta: Secondary | ICD-10-CM | POA: Diagnosis not present

## 2023-09-24 ENCOUNTER — Encounter (INDEPENDENT_AMBULATORY_CARE_PROVIDER_SITE_OTHER): Payer: Self-pay | Admitting: Gastroenterology

## 2023-09-27 DIAGNOSIS — R3129 Other microscopic hematuria: Secondary | ICD-10-CM | POA: Diagnosis not present

## 2023-09-27 DIAGNOSIS — I129 Hypertensive chronic kidney disease with stage 1 through stage 4 chronic kidney disease, or unspecified chronic kidney disease: Secondary | ICD-10-CM | POA: Diagnosis not present

## 2023-09-27 DIAGNOSIS — N1832 Chronic kidney disease, stage 3b: Secondary | ICD-10-CM | POA: Diagnosis not present

## 2023-09-27 DIAGNOSIS — R809 Proteinuria, unspecified: Secondary | ICD-10-CM | POA: Diagnosis not present

## 2023-09-27 DIAGNOSIS — K754 Autoimmune hepatitis: Secondary | ICD-10-CM | POA: Diagnosis not present

## 2023-10-11 ENCOUNTER — Ambulatory Visit (INDEPENDENT_AMBULATORY_CARE_PROVIDER_SITE_OTHER): Admitting: Gastroenterology

## 2023-10-11 ENCOUNTER — Encounter (INDEPENDENT_AMBULATORY_CARE_PROVIDER_SITE_OTHER): Payer: Self-pay | Admitting: Gastroenterology

## 2023-10-11 VITALS — BP 133/89 | HR 99 | Temp 98.4°F | Ht 74.0 in | Wt 239.3 lb

## 2023-10-11 DIAGNOSIS — K7460 Unspecified cirrhosis of liver: Secondary | ICD-10-CM

## 2023-10-11 DIAGNOSIS — K754 Autoimmune hepatitis: Secondary | ICD-10-CM | POA: Diagnosis not present

## 2023-10-11 NOTE — Patient Instructions (Addendum)
 We will update some labs For now, continue prednisone  5mg  daily and azathioprine  50mg  daily - Reduce salt intake to <2 g per day - Can take Tylenol  max of 2 g per day (650 mg q8h) for pain - Avoid NSAIDs for pain - Avoid eating raw oysters/shellfish - Ensure every night before going to sleep  Follow up 6 months  It was a pleasure to see you today. I want to create trusting relationships with patients and provide genuine, compassionate, and quality care. I truly value your feedback! please be on the lookout for a survey regarding your visit with me today. I appreciate your input about our visit and your time in completing this!    Jessabelle Markiewicz L. Lyah Millirons, MSN, APRN, AGNP-C Adult-Gerontology Nurse Practitioner [facility] at [facility]

## 2023-10-11 NOTE — Progress Notes (Signed)
 Referring Provider: Marvine Rush, MD Primary Care Physician:  Shona Rush PEDLAR, MD Primary GI Physician: Dr. Eartha   Chief Complaint  Patient presents with   Follow-up    Patient here today for a follow up on Autoimmune Hepatitis. Patient denies any current Gi related issues.   HPI:   Harold Nicholson is a 73 y.o. male with past medical history of  arthritis, hiatal hernia, high cholesterol, HTN, Hypothyroidism, autoimmune hepatitis, advanced liver fibrosis/?cirrhosis   Patient presenting today for:  Follow up of cirrhosis Autoimmune hepatitis  Last seen February, at that time doing well, noted more bruising recently. No other complaints IgG in January 2025 was 919, LFTs remain WNL, on pred 7.5mg  daily, AZA 50mg  daily   Recommended continue AZA 50mg  daily, decreased prednisone  to 5mg  daily, CBC, INR, afp, CMP and IgG in April, US  in may, consider EGD if plt count <150k  Labs in feb Inr 1.1 Afp 3.8  labs in july CBC with plt 159k hgb 12.3  LFTs ALT 11 AST 25 Alk phos 31 T bili 0.6  IgG and CMP not yet completed   Present: Doing well. Had recent labs done with PCP. Denies any episodes of confusion, no swelling to his abdomen or legs. No blood in stools or black stools. Denies any infections or need for antibiotics recently. Still with some frequent bruising as before. No red flag symptoms. Patient denies melena, hematochezia, nausea, vomiting, diarrhea, constipation, dysphagia, odyonophagia, early satiety or weight loss.    Last MELD 3.0 in feb 15 (creatinine likely influencing elevation)   Cirrhosis related questions: Episodes of confusion/disorientation:  no Taking diuretics?  None  Beta blockers?  None  Prior history of variceal banding? None  Prior episodes of SBP? None  Last liver imaging: 06/2023 No focal liver lesion identified. Cirrhosis of the liver. Last AFP 03/2023 3.8  Alcohol use: none      Pertinent history: 12/2021 findings of possible cirrhosis on  CT  imaging.    ANA and ASMA positive, IgG/IgM normal [place]  of liver in may 2024 with cirrhotic morphology, no focal lesions   Liver biopsy on 04/09/22 with mildly active chronic hepatitis (Autoimmune hepatitis), mild to moderate fibrosis, stage 2-3, though given limited sampling, recommended to treat as cirrhosis with routine MELD labs, AFP and HCC screening   started on AZA 50mg  daily and prednisone  30mg  taper. Advised to continue with cirrhosis labs and HCC screening every 6 months.    CMP in April 2024 with ALk phos 32, AST 26, ALT 29, continued on AZA 50mg  daily and prednisone  10mg  daily    Alk phos in June was 32, AST 22 and ALT 18, pt advised to decrease prednisone  to 7.5mg  daily and continued on AZA 50mg  daily    CT A/P with contrast: November 2023 cirrhosis. 1 cm hypodense focus in the right lobe of the liver (15/2) appears similar to prior CT, likely a cyst. There is mild biliary ductal dilatation post cholecystectomy. No retained calcified stone noted in the central CBD. (LFTs normal at this time) Last Colonoscopy:08/2021 Dr. Evonnie  - The entire examined colon is normal on direct and   retroflexion views. - No specimens collected. - Hemorrhoids. Last Endoscopy:09/2010, gastritis, hiatal hernia    Filed Weights   10/11/23 1308  Weight: 239 lb 4.8 oz (108.5 kg)     Past Medical History:  Diagnosis Date   Arthritis    Complication of anesthesia    pt stated he stopped breathing during surgery  Hiatal hernia    Hypercholesteremia    Hypertension    Hypothyroidism    Impaired fasting glucose    Pure hyperglyceridemia    Thyroid  disease    Hypothyroid     Past Surgical History:  Procedure Laterality Date   CHOLECYSTECTOMY     CHOLECYSTECTOMY N/A 05/01/2013   Procedure: LAPAROSCOPIC CHOLECYSTECTOMY;  Surgeon: Oneil DELENA Budge, MD;  Location: [facility];  Service: General;  Laterality: N/A;   COLONOSCOPY  10/07/2010   Procedure: COLONOSCOPY;  Surgeon: Oneil DELENA Budge;   [facility];  Service: Gastroenterology;  Laterality: N/A;   COLONOSCOPY WITH PROPOFOL  N/A 09/02/2021   Procedure: COLONOSCOPY WITH PROPOFOL ;  Surgeon: Evonnie Dorothyann DELENA, DO;  [facility];  Service: General;  Laterality: N/A;   ESOPHAGOGASTRODUODENOSCOPY  10/07/2010   Procedure: ESOPHAGOGASTRODUODENOSCOPY (EGD);  Surgeon: Oneil DELENA Budge;  [facility];  Service: Gastroenterology;  Laterality: N/A;   EYE SURGERY     JOINT REPLACEMENT     Right knee   Left Knee Arthroscopy     LIVER BIOPSY N/A 05/01/2013   Procedure: LIVER BIOPSY;  Surgeon: Oneil DELENA Budge, MD;  Location: [facility];  Service: General;  Laterality: N/A;   right knee     X 2   TOTAL KNEE ARTHROPLASTY Left 09/17/2014   Procedure: TOTAL KNEE ARTHROPLASTY;  Surgeon: Marcey Raman, MD;  Location: [facility];  Service: Orthopedics;  Laterality: Left;    Current Outpatient Medications  Medication Sig Dispense Refill   azaTHIOprine  (IMURAN ) 50 MG tablet TAKE 1 TABLET BY MOUTH DAILY 100 tablet 2   bisoprolol -hydrochlorothiazide  (ZIAC ) 10-6.25 MG per tablet Take 1 tablet by mouth in the morning.     Cholecalciferol (VITAMIN D) 50 MCG (2000 UT) tablet Take 2,000 Units by mouth in the morning.     escitalopram (LEXAPRO) 10 MG tablet Take 5 mg by mouth daily.     fenofibrate  160 MG tablet Take 160 mg by mouth in the morning.     levothyroxine  (SYNTHROID ) 150 MCG tablet Take 150 mcg by mouth daily before breakfast.     loratadine  (CLARITIN ) 10 MG tablet Take 10 mg by mouth in the morning.     meclizine (ANTIVERT) 25 MG tablet Take 25 mg by mouth 3 (three) times daily as needed for dizziness.     Multiple Vitamins-Minerals (MENS 50+ MULTI VITAMIN/MIN PO) Take by mouth daily at 6 (six) AM.     omeprazole (PRILOSEC) 40 MG capsule Take 40 mg by mouth daily before breakfast.     potassium gluconate 595 (99 K) MG TABS tablet Take 595 mg by mouth 2 (two) times daily.     predniSONE  (DELTASONE ) 2.5 MG tablet Take 2  tablets (5 mg total) by mouth daily with breakfast. 180 tablet 1   tadalafil (CIALIS) 5 MG tablet Take 5 mg by mouth every Saturday. In the morning     No current facility-administered medications for this visit.    Allergies as of 10/11/2023   (No Known Allergies)    Social History   Socioeconomic History   Marital status: Married    Spouse name: Not on file   Number of children: Not on file   Years of education: Not on file   Highest education level: Not on file  Occupational History   Not on file  Tobacco Use   Smoking status: Never    Passive exposure: Never   Smokeless tobacco: Current    Types: Snuff  Vaping Use  Vaping status: Never Used  Substance and Sexual Activity   Alcohol use: No    Comment: quit drinking 3 years ago   Drug use: No   Sexual activity: Yes    Birth control/protection: None  Other Topics Concern   Not on file  Social History Narrative   Not on file   Social Drivers of Health   Financial Resource Strain: Not on file  Food Insecurity: Not on file  Transportation Needs: Not on file  Physical Activity: Not on file  Stress: Not on file  Social Connections: Not on file    Review of systems General: negative for malaise, night sweats, fever, chills, weight loss Neck: Negative for lumps, goiter, pain and significant neck swelling Resp: Negative for cough, wheezing, dyspnea at rest CV: Negative for chest pain, leg swelling, palpitations, orthopnea GI: denies melena, hematochezia, nausea, vomiting, diarrhea, constipation, dysphagia, odyonophagia, early satiety or unintentional weight loss.  MSK: Negative for joint pain or swelling, back pain, and muscle pain. Derm: Negative for itching or rash Psych: Denies depression, anxiety, memory loss, confusion. No homicidal or suicidal ideation.  Heme: Negative for prolonged bleeding, and swollen nodes. +easy bruising  Endocrine: Negative for cold or heat intolerance, polyuria, polydipsia and  goiter. Neuro: negative for tremor, gait imbalance, syncope and seizures. The remainder of the review of systems is noncontributory.  Physical Exam: BP 133/89 (BP Location: Left Arm, Patient Position: Sitting, Cuff Size: Large)   Pulse 99   Temp 98.4 F (36.9 C) (Temporal)   Ht 6' 2 (1.88 m)   Wt 239 lb 4.8 oz (108.5 kg)   BMI 30.72 kg/m  General:   Alert and oriented. No distress noted. Pleasant and cooperative.  Head:  Normocephalic and atraumatic. Eyes:  Conjuctiva clear without scleral icterus. Mouth:  Oral mucosa pink and moist. Good dentition. No lesions. Heart: Normal rate and rhythm, s1 and s2 heart sounds present.  Lungs: Clear lung sounds in all lobes. Respirations equal and unlabored. Abdomen:  +BS, soft, non-tender and non-distended. No rebound or guarding. No HSM or masses noted. Derm: No palmar erythema or jaundice Msk:  Symmetrical without gross deformities. Normal posture. Extremities:  Without edema. Neurologic:  Alert and  oriented x4 Psych:  Alert and cooperative. Normal mood and affect.  Invalid input(s): 6 MONTHS   ASSESSMENT: ASWAD WANDREY is a 73 y.o. male presenting today for follow up of cirrhosis and Autoimmune hepatitis  AIH: IgG has remained normal throughout, was advised at last visit to decrease prednisone  to 5mg  daily which he did. Most recent labs at the end of July with Normal LFTs. He is continued on AZA 50mg  daily. IgG was to be repeated earlier this year but has not yet been done. For now will continue with prednisone  5mg  daily, AZA 50mg  daily, pending IgG, may decrease prednisone  to 2.5mg  daily. As his platelets and INR are WNL, suspect easy bruising is secondary to use of prednisone .    Advanced liver fibrosis/question of cirrhosis: imaging of the liver has been consistent with cirrhosis, though biopsy showed F2/F3 fibrosis, however given such small sampling in biopsy, it was recommended that patient have MELD labs, AFP and HCC screening  every 6 months. He is due for AFP and INR, He has had no signs of HE, no ascites, and remains well compensated. Last MELD 3.0 was 14. Recent CBC at the end of last month with platelet count >150k, therefore no EGD for EV screening at this time.    PLAN:  -  update CBC, AFP, IgG, INR -US  for [facility] screening due November -continue prednisone  5mg  daily, AZA 50mg  daily -Reduce salt intake to <2 g per day - Can take Tylenol  max of 2 g per day (650 mg q8h) for pain - Avoid NSAIDs for pain - Avoid eating raw oysters/shellfish - Ensure every night before going to sleep  All questions were answered, patient verbalized understanding and is in agreement with plan as outlined above.   Follow Up: 6 months   Harold Teed L. Mariette, MSN, APRN, AGNP-C Adult-Gerontology Nurse Practitioner [facility]  I have reviewed the note and agree with the APP's assessment as described in this progress note  Harold Fortune, MD Gastroenterology and Hepatology [facility]astro[facility]

## 2023-10-13 ENCOUNTER — Telehealth (INDEPENDENT_AMBULATORY_CARE_PROVIDER_SITE_OTHER): Payer: Self-pay

## 2023-10-13 ENCOUNTER — Other Ambulatory Visit (INDEPENDENT_AMBULATORY_CARE_PROVIDER_SITE_OTHER): Payer: Self-pay

## 2023-10-13 DIAGNOSIS — D649 Anemia, unspecified: Secondary | ICD-10-CM

## 2023-10-13 NOTE — Telephone Encounter (Signed)
 Cbc can not be added as it requires a purple top tube and no purple top tube was drawn. Please advise.    Carlan, Chelsea L, NP  Peri Camelia CROME, CMA; Arthurine Merle, LPN Can we see if we can add a CBC on if he has already done his labs?

## 2023-10-13 NOTE — Telephone Encounter (Signed)
 I spoke with the patient and he says he had not gone yet for the blood work. He was next door at the infusion clinic and I personally took the order for a cbc to him and asked that he give the orders previously given to him and this order for a cbc to the Lab when he goes to have them drawn. Patient states understanding.

## 2023-10-14 DIAGNOSIS — D649 Anemia, unspecified: Secondary | ICD-10-CM | POA: Diagnosis not present

## 2023-10-14 DIAGNOSIS — K7460 Unspecified cirrhosis of liver: Secondary | ICD-10-CM | POA: Diagnosis not present

## 2023-10-14 DIAGNOSIS — K754 Autoimmune hepatitis: Secondary | ICD-10-CM | POA: Diagnosis not present

## 2023-10-15 LAB — HEPATIC FUNCTION PANEL
ALT: 13 IU/L (ref 0–44)
AST: 20 IU/L (ref 0–40)
Albumin: 4.1 g/dL (ref 3.8–4.8)
Alkaline Phosphatase: 36 IU/L — ABNORMAL LOW (ref 44–121)
Bilirubin Total: 0.7 mg/dL (ref 0.0–1.2)
Bilirubin, Direct: 0.31 mg/dL (ref 0.00–0.40)
Total Protein: 6.5 g/dL (ref 6.0–8.5)

## 2023-10-15 LAB — CBC WITH DIFFERENTIAL/PLATELET
Basophils Absolute: 0 x10E3/uL (ref 0.0–0.2)
Basos: 1 %
EOS (ABSOLUTE): 0.2 x10E3/uL (ref 0.0–0.4)
Eos: 2 %
Hematocrit: 43.3 % (ref 37.5–51.0)
Hemoglobin: 14.2 g/dL (ref 13.0–17.7)
Immature Grans (Abs): 0.1 x10E3/uL (ref 0.0–0.1)
Immature Granulocytes: 1 %
Lymphocytes Absolute: 1.1 x10E3/uL (ref 0.7–3.1)
Lymphs: 17 %
MCH: 34.1 pg — ABNORMAL HIGH (ref 26.6–33.0)
MCHC: 32.8 g/dL (ref 31.5–35.7)
MCV: 104 fL — ABNORMAL HIGH (ref 79–97)
Monocytes Absolute: 0.5 x10E3/uL (ref 0.1–0.9)
Monocytes: 8 %
Neutrophils Absolute: 4.7 x10E3/uL (ref 1.4–7.0)
Neutrophils: 71 %
Platelets: 183 x10E3/uL (ref 150–450)
RBC: 4.16 x10E6/uL (ref 4.14–5.80)
RDW: 12.1 % (ref 11.6–15.4)
WBC: 6.5 x10E3/uL (ref 3.4–10.8)

## 2023-10-15 LAB — PROTIME-INR
INR: 1.1 (ref 0.9–1.2)
Prothrombin Time: 11.9 s (ref 9.1–12.0)

## 2023-10-15 LAB — IGG: IgG (Immunoglobin G), Serum: 961 mg/dL (ref 603–1613)

## 2023-10-15 LAB — AFP TUMOR MARKER: AFP, Serum, Tumor Marker: 3.9 ng/mL (ref 0.0–8.4)

## 2023-10-19 ENCOUNTER — Ambulatory Visit (INDEPENDENT_AMBULATORY_CARE_PROVIDER_SITE_OTHER): Payer: Self-pay | Admitting: Gastroenterology

## 2023-11-01 ENCOUNTER — Other Ambulatory Visit (INDEPENDENT_AMBULATORY_CARE_PROVIDER_SITE_OTHER): Payer: Self-pay | Admitting: Gastroenterology

## 2023-11-01 DIAGNOSIS — K7460 Unspecified cirrhosis of liver: Secondary | ICD-10-CM

## 2023-11-01 DIAGNOSIS — K754 Autoimmune hepatitis: Secondary | ICD-10-CM

## 2023-11-01 MED ORDER — PREDNISONE 2.5 MG PO TABS: 2.5000 mg | ORAL_TABLET | Freq: Every day | ORAL | 3 refills | Status: AC

## 2023-11-15 ENCOUNTER — Ambulatory Visit: Payer: Medicare HMO | Admitting: Orthopedic Surgery

## 2023-12-02 ENCOUNTER — Encounter (INDEPENDENT_AMBULATORY_CARE_PROVIDER_SITE_OTHER): Payer: Self-pay | Admitting: *Deleted

## 2023-12-06 ENCOUNTER — Encounter: Payer: Self-pay | Admitting: Orthopedic Surgery

## 2023-12-06 ENCOUNTER — Other Ambulatory Visit (INDEPENDENT_AMBULATORY_CARE_PROVIDER_SITE_OTHER)

## 2023-12-06 ENCOUNTER — Ambulatory Visit: Admitting: Orthopedic Surgery

## 2023-12-06 DIAGNOSIS — M19071 Primary osteoarthritis, right ankle and foot: Secondary | ICD-10-CM

## 2023-12-06 DIAGNOSIS — M85679 Other cyst of bone, unspecified ankle and foot: Secondary | ICD-10-CM

## 2023-12-06 NOTE — Progress Notes (Signed)
    12/06/2023   Chief Complaint  Patient presents with   Ankle Pain    Right    Encounter Diagnoses  Name Primary?   Arthritis of right ankle Yes   Bone cyst of foot, calcaneus     What pharmacy do you use ? ____[facility]_______________________  DOI/DOS/ Date:    Harold Nicholson is doing well with his right ankle  He did slip fall and twist his ankle and had some pain and thought he may need to have surgery but it went away  Minimal symptoms at present  Dorsiflexion plantarflexion remain intact stable ankle drawer  He sort of has a pes planus foot good eversion inversion in the subtalar joint  Tenderness in the anterolateral ankle joint  No effusion warmth or swelling around the joint   DG Ankle Complete Right Result Date: 12/06/2023 Follow-up imaging right ankle patient with history of osteoarthritis right ankle and cyst in his calcaneus Overall alignment shows a tilt in the talus there is a cyst in the middle of the talus there are bony ossicles and osteophytes around the lateral gutter medial gutter and medial and lateral talus with asymmetric wear of the lateral shoulder and lateral part of the ankle joint The bone cyst is stable He has a plantar spur and calcification in the Achilles Impression Stable ankle arthritis with characteristic findings Stable bone cyst on follow-up Degenerative changes as noted     X-ray again in a year

## 2023-12-06 NOTE — Progress Notes (Signed)
    12/06/2023   Chief Complaint  Patient presents with   Ankle Pain    Right    No diagnosis found.  What pharmacy do you use ? ____[facility]_______________________  DOI/DOS/ Date:    Unchanged  Wearing crocs

## 2023-12-09 ENCOUNTER — Other Ambulatory Visit (INDEPENDENT_AMBULATORY_CARE_PROVIDER_SITE_OTHER): Payer: Self-pay

## 2023-12-09 ENCOUNTER — Telehealth (INDEPENDENT_AMBULATORY_CARE_PROVIDER_SITE_OTHER): Payer: Self-pay

## 2023-12-09 DIAGNOSIS — K754 Autoimmune hepatitis: Secondary | ICD-10-CM

## 2023-12-09 DIAGNOSIS — K7460 Unspecified cirrhosis of liver: Secondary | ICD-10-CM

## 2023-12-09 NOTE — Telephone Encounter (Signed)
 Spoke with patients wife Dickey (on [place]). Informed her of 6 month ultrasound appt on 12/27/2023 at 8:30 (NPO midnight).

## 2023-12-27 ENCOUNTER — Ambulatory Visit (HOSPITAL_COMMUNITY)
Admission: RE | Admit: 2023-12-27 | Discharge: 2023-12-27 | Disposition: A | Discharge status: Home / Self Care | Source: Ambulatory Visit | Attending: Gastroenterology | Admitting: Gastroenterology

## 2023-12-27 DIAGNOSIS — K7460 Unspecified cirrhosis of liver: Secondary | ICD-10-CM | POA: Insufficient documentation

## 2023-12-27 DIAGNOSIS — K754 Autoimmune hepatitis: Secondary | ICD-10-CM | POA: Diagnosis present

## 2023-12-29 ENCOUNTER — Ambulatory Visit (INDEPENDENT_AMBULATORY_CARE_PROVIDER_SITE_OTHER): Payer: Self-pay | Admitting: Gastroenterology

## 2023-12-30 ENCOUNTER — Other Ambulatory Visit (INDEPENDENT_AMBULATORY_CARE_PROVIDER_SITE_OTHER): Payer: Self-pay | Admitting: Gastroenterology

## 2023-12-30 DIAGNOSIS — K754 Autoimmune hepatitis: Secondary | ICD-10-CM

## 2023-12-30 DIAGNOSIS — K769 Liver disease, unspecified: Secondary | ICD-10-CM

## 2023-12-30 DIAGNOSIS — K7460 Unspecified cirrhosis of liver: Secondary | ICD-10-CM

## 2023-12-30 LAB — CBC WITH DIFFERENTIAL/PLATELET
Basophils Absolute: 0 x10E3/uL (ref 0.0–0.2)
Basos: 0 %
EOS (ABSOLUTE): 0.2 x10E3/uL (ref 0.0–0.4)
Eos: 4 %
Hematocrit: 39 % (ref 37.5–51.0)
Hemoglobin: 12.9 g/dL — ABNORMAL LOW (ref 13.0–17.7)
Immature Grans (Abs): 0 x10E3/uL (ref 0.0–0.1)
Immature Granulocytes: 0 %
Lymphocytes Absolute: 1.3 x10E3/uL (ref 0.7–3.1)
Lymphs: 21 %
MCH: 34.6 pg — ABNORMAL HIGH (ref 26.6–33.0)
MCHC: 33.1 g/dL (ref 31.5–35.7)
MCV: 105 fL — ABNORMAL HIGH (ref 79–97)
Monocytes Absolute: 0.6 x10E3/uL (ref 0.1–0.9)
Monocytes: 9 %
Neutrophils Absolute: 4 x10E3/uL (ref 1.4–7.0)
Neutrophils: 65 %
Platelets: 190 x10E3/uL (ref 150–450)
RBC: 3.73 x10E6/uL — ABNORMAL LOW (ref 4.14–5.80)
RDW: 12.1 % (ref 11.6–15.4)
WBC: 6.1 x10E3/uL (ref 3.4–10.8)

## 2024-01-01 ENCOUNTER — Ambulatory Visit (INDEPENDENT_AMBULATORY_CARE_PROVIDER_SITE_OTHER): Payer: Self-pay | Admitting: Gastroenterology

## 2024-01-06 ENCOUNTER — Ambulatory Visit (HOSPITAL_COMMUNITY)
Admission: RE | Admit: 2024-01-06 | Discharge: 2024-01-06 | Disposition: A | Discharge status: Home / Self Care | Source: Ambulatory Visit | Attending: Gastroenterology | Admitting: Gastroenterology

## 2024-01-06 DIAGNOSIS — K769 Liver disease, unspecified: Secondary | ICD-10-CM | POA: Diagnosis present

## 2024-01-06 DIAGNOSIS — K7460 Unspecified cirrhosis of liver: Secondary | ICD-10-CM | POA: Insufficient documentation

## 2024-01-06 DIAGNOSIS — K754 Autoimmune hepatitis: Secondary | ICD-10-CM | POA: Insufficient documentation

## 2024-01-06 MED ORDER — GADOBUTROL 1 MMOL/ML IV SOLN
10.0000 mL | Freq: Once | INTRAVENOUS | Status: AC | PRN
  Administered 2024-01-06: 10 mL via INTRAVENOUS

## 2024-01-11 ENCOUNTER — Ambulatory Visit (INDEPENDENT_AMBULATORY_CARE_PROVIDER_SITE_OTHER): Payer: Self-pay | Admitting: Gastroenterology

## 2024-03-11 ENCOUNTER — Other Ambulatory Visit (INDEPENDENT_AMBULATORY_CARE_PROVIDER_SITE_OTHER): Payer: Self-pay | Admitting: Gastroenterology

## 2024-03-11 DIAGNOSIS — K754 Autoimmune hepatitis: Secondary | ICD-10-CM

## 2024-03-11 DIAGNOSIS — K7460 Unspecified cirrhosis of liver: Secondary | ICD-10-CM

## 2024-12-06 ENCOUNTER — Ambulatory Visit: Admitting: Orthopedic Surgery
